# Patient Record
Sex: Female | Born: 1945 | Race: White | Hispanic: No | Smoking: Never smoker
Health system: Southern US, Community
[De-identification: ages and names within clinical notes are randomized; demographics above are authoritative.]

## PROBLEM LIST (undated history)

## (undated) DIAGNOSIS — K648 Other hemorrhoids: Secondary | ICD-10-CM

## (undated) DIAGNOSIS — E538 Deficiency of other specified B group vitamins: Secondary | ICD-10-CM

## (undated) DIAGNOSIS — IMO0002 Reserved for concepts with insufficient information to code with codable children: Secondary | ICD-10-CM

## (undated) DIAGNOSIS — K602 Anal fissure, unspecified: Secondary | ICD-10-CM

## (undated) DIAGNOSIS — K219 Gastro-esophageal reflux disease without esophagitis: Secondary | ICD-10-CM

## (undated) DIAGNOSIS — K5730 Diverticulosis of large intestine without perforation or abscess without bleeding: Secondary | ICD-10-CM

## (undated) DIAGNOSIS — Z86010 Personal history of colonic polyps: Secondary | ICD-10-CM

## (undated) DIAGNOSIS — N83209 Unspecified ovarian cyst, unspecified side: Secondary | ICD-10-CM

## (undated) DIAGNOSIS — F418 Other specified anxiety disorders: Secondary | ICD-10-CM

## (undated) DIAGNOSIS — D649 Anemia, unspecified: Secondary | ICD-10-CM

## (undated) DIAGNOSIS — K6389 Other specified diseases of intestine: Secondary | ICD-10-CM

## (undated) DIAGNOSIS — K589 Irritable bowel syndrome without diarrhea: Secondary | ICD-10-CM

## (undated) HISTORY — PX: APPENDECTOMY: SHX54

## (undated) HISTORY — DX: Diverticulosis of large intestine without perforation or abscess without bleeding: K57.30

## (undated) HISTORY — DX: Anemia, unspecified: D64.9

## (undated) HISTORY — DX: Anal fissure, unspecified: K60.2

## (undated) HISTORY — DX: Reserved for concepts with insufficient information to code with codable children: IMO0002

## (undated) HISTORY — PX: BLADDER SUSPENSION: SHX72

## (undated) HISTORY — DX: Deficiency of other specified B group vitamins: E53.8

## (undated) HISTORY — DX: Irritable bowel syndrome without diarrhea: K58.9

## (undated) HISTORY — DX: Other hemorrhoids: K64.8

## (undated) HISTORY — DX: Unspecified ovarian cyst, unspecified side: N83.209

## (undated) HISTORY — DX: Personal history of colonic polyps: Z86.010

---

## 1970-06-22 HISTORY — PX: ABDOMINAL HYSTERECTOMY: SHX81

## 1976-06-22 HISTORY — PX: ABDOMINOPLASTY: SUR9

## 1978-06-22 HISTORY — PX: BREAST ENHANCEMENT SURGERY: SHX7

## 2000-06-22 DIAGNOSIS — K6389 Other specified diseases of intestine: Secondary | ICD-10-CM

## 2000-06-22 DIAGNOSIS — K589 Irritable bowel syndrome without diarrhea: Secondary | ICD-10-CM

## 2000-06-22 DIAGNOSIS — K5730 Diverticulosis of large intestine without perforation or abscess without bleeding: Secondary | ICD-10-CM

## 2000-06-22 HISTORY — DX: Other specified diseases of intestine: K63.89

## 2000-06-22 HISTORY — DX: Irritable bowel syndrome, unspecified: K58.9

## 2000-06-22 HISTORY — DX: Diverticulosis of large intestine without perforation or abscess without bleeding: K57.30

## 2000-07-12 ENCOUNTER — Other Ambulatory Visit: Admission: RE | Admit: 2000-07-12 | Discharge: 2000-07-12 | Payer: Self-pay | Admitting: *Deleted

## 2000-08-06 ENCOUNTER — Encounter: Payer: Self-pay | Admitting: *Deleted

## 2000-08-06 ENCOUNTER — Encounter: Admission: RE | Admit: 2000-08-06 | Discharge: 2000-08-06 | Payer: Self-pay | Admitting: *Deleted

## 2000-08-09 ENCOUNTER — Encounter: Payer: Self-pay | Admitting: Gastroenterology

## 2000-08-09 DIAGNOSIS — K449 Diaphragmatic hernia without obstruction or gangrene: Secondary | ICD-10-CM

## 2000-08-09 DIAGNOSIS — K219 Gastro-esophageal reflux disease without esophagitis: Secondary | ICD-10-CM

## 2000-09-24 ENCOUNTER — Other Ambulatory Visit: Admission: RE | Admit: 2000-09-24 | Discharge: 2000-09-24 | Payer: Self-pay | Admitting: *Deleted

## 2000-09-24 ENCOUNTER — Encounter (INDEPENDENT_AMBULATORY_CARE_PROVIDER_SITE_OTHER): Payer: Self-pay | Admitting: Specialist

## 2001-06-22 HISTORY — PX: CHOLECYSTECTOMY: SHX55

## 2001-07-12 ENCOUNTER — Emergency Department (HOSPITAL_COMMUNITY): Admission: EM | Admit: 2001-07-12 | Discharge: 2001-07-12 | Payer: Self-pay | Admitting: Emergency Medicine

## 2001-07-22 ENCOUNTER — Observation Stay (HOSPITAL_COMMUNITY): Admission: RE | Admit: 2001-07-22 | Discharge: 2001-07-23 | Payer: Self-pay | Admitting: General Surgery

## 2001-07-22 ENCOUNTER — Encounter (INDEPENDENT_AMBULATORY_CARE_PROVIDER_SITE_OTHER): Payer: Self-pay

## 2001-09-15 ENCOUNTER — Other Ambulatory Visit: Admission: RE | Admit: 2001-09-15 | Discharge: 2001-09-15 | Payer: Self-pay | Admitting: *Deleted

## 2001-11-26 ENCOUNTER — Emergency Department (HOSPITAL_COMMUNITY): Admission: EM | Admit: 2001-11-26 | Discharge: 2001-11-26 | Payer: Self-pay | Admitting: Emergency Medicine

## 2003-09-27 ENCOUNTER — Other Ambulatory Visit: Admission: RE | Admit: 2003-09-27 | Discharge: 2003-09-27 | Payer: Self-pay | Admitting: *Deleted

## 2004-08-19 ENCOUNTER — Ambulatory Visit: Payer: Self-pay | Admitting: Gastroenterology

## 2004-11-03 ENCOUNTER — Other Ambulatory Visit: Admission: RE | Admit: 2004-11-03 | Discharge: 2004-11-03 | Payer: Self-pay | Admitting: *Deleted

## 2005-01-28 ENCOUNTER — Ambulatory Visit: Payer: Self-pay | Admitting: Internal Medicine

## 2005-08-21 ENCOUNTER — Ambulatory Visit: Payer: Self-pay | Admitting: Gastroenterology

## 2005-08-24 ENCOUNTER — Ambulatory Visit: Payer: Self-pay | Admitting: Internal Medicine

## 2005-08-31 ENCOUNTER — Ambulatory Visit: Payer: Self-pay | Admitting: Gastroenterology

## 2005-08-31 DIAGNOSIS — K5730 Diverticulosis of large intestine without perforation or abscess without bleeding: Secondary | ICD-10-CM | POA: Insufficient documentation

## 2006-04-05 ENCOUNTER — Other Ambulatory Visit: Admission: RE | Admit: 2006-04-05 | Discharge: 2006-04-05 | Payer: Self-pay | Admitting: *Deleted

## 2006-04-22 ENCOUNTER — Ambulatory Visit: Payer: Self-pay | Admitting: Gastroenterology

## 2006-04-22 LAB — CONVERTED CEMR LAB
ALT: 13 units/L (ref 0–40)
AST: 13 units/L (ref 0–37)
Albumin: 3.2 g/dL — ABNORMAL LOW (ref 3.5–5.2)
Alkaline Phosphatase: 62 units/L (ref 39–117)
BUN: 10 mg/dL (ref 6–23)
Basophils Absolute: 0.2 10*3/uL — ABNORMAL HIGH (ref 0.0–0.1)
Basophils Relative: 1.2 % — ABNORMAL HIGH (ref 0.0–1.0)
CO2: 27 meq/L (ref 19–32)
Calcium: 8.7 mg/dL (ref 8.4–10.5)
Chloride: 105 meq/L (ref 96–112)
Creatinine, Ser: 1 mg/dL (ref 0.4–1.2)
Eosinophil percent: 0.5 % (ref 0.0–5.0)
Ferritin: 12.4 ng/mL (ref 10.0–291.0)
Folate: 17.3 ng/mL
GFR calc non Af Amer: 60 mL/min
Glomerular Filtration Rate, Af Am: 72 mL/min/{1.73_m2}
Glucose, Bld: 93 mg/dL (ref 70–99)
HCT: 38.2 % (ref 36.0–46.0)
Hemoglobin: 13 g/dL (ref 12.0–15.0)
Iron: 97 ug/dL (ref 42–145)
Lymphocytes Relative: 22.2 % (ref 12.0–46.0)
MCHC: 34 g/dL (ref 30.0–36.0)
MCV: 87.7 fL (ref 78.0–100.0)
Monocytes Absolute: 0.5 10*3/uL (ref 0.2–0.7)
Monocytes Relative: 3.9 % (ref 3.0–11.0)
Neutro Abs: 8.9 10*3/uL — ABNORMAL HIGH (ref 1.4–7.7)
Neutrophils Relative %: 72.2 % (ref 43.0–77.0)
Platelets: 367 10*3/uL (ref 150–400)
Potassium: 4 meq/L (ref 3.5–5.1)
RBC: 4.36 M/uL (ref 3.87–5.11)
RDW: 12.6 % (ref 11.5–14.6)
Sodium: 138 meq/L (ref 135–145)
T4, Total: 7 ug/dL (ref 5.0–12.5)
TSH: 2.35 microintl units/mL (ref 0.35–5.50)
Total Bilirubin: 0.4 mg/dL (ref 0.3–1.2)
Total Protein: 6.9 g/dL (ref 6.0–8.3)
Vitamin B-12: 272 pg/mL (ref 211–911)
WBC: 12.5 10*3/uL — ABNORMAL HIGH (ref 4.5–10.5)

## 2006-04-26 ENCOUNTER — Ambulatory Visit: Payer: Self-pay | Admitting: Internal Medicine

## 2007-03-15 ENCOUNTER — Ambulatory Visit: Payer: Self-pay | Admitting: Gastroenterology

## 2007-04-11 ENCOUNTER — Emergency Department (HOSPITAL_COMMUNITY): Admission: EM | Admit: 2007-04-11 | Discharge: 2007-04-11 | Payer: Self-pay | Admitting: Emergency Medicine

## 2007-08-31 ENCOUNTER — Other Ambulatory Visit: Admission: RE | Admit: 2007-08-31 | Discharge: 2007-08-31 | Payer: Self-pay | Admitting: *Deleted

## 2007-09-05 DIAGNOSIS — F4323 Adjustment disorder with mixed anxiety and depressed mood: Secondary | ICD-10-CM

## 2007-09-05 DIAGNOSIS — N83209 Unspecified ovarian cyst, unspecified side: Secondary | ICD-10-CM

## 2007-09-05 DIAGNOSIS — IMO0002 Reserved for concepts with insufficient information to code with codable children: Secondary | ICD-10-CM | POA: Insufficient documentation

## 2007-09-05 DIAGNOSIS — K602 Anal fissure, unspecified: Secondary | ICD-10-CM

## 2007-09-05 DIAGNOSIS — R159 Full incontinence of feces: Secondary | ICD-10-CM | POA: Insufficient documentation

## 2007-09-05 DIAGNOSIS — K589 Irritable bowel syndrome without diarrhea: Secondary | ICD-10-CM

## 2008-01-23 ENCOUNTER — Ambulatory Visit: Payer: Self-pay | Admitting: Internal Medicine

## 2008-01-23 DIAGNOSIS — R21 Rash and other nonspecific skin eruption: Secondary | ICD-10-CM

## 2008-03-22 ENCOUNTER — Ambulatory Visit: Payer: Self-pay | Admitting: Gastroenterology

## 2008-03-22 DIAGNOSIS — K902 Blind loop syndrome, not elsewhere classified: Secondary | ICD-10-CM

## 2008-03-22 LAB — CONVERTED CEMR LAB
ALT: 18 units/L (ref 0–35)
AST: 15 units/L (ref 0–37)
Albumin: 3.3 g/dL — ABNORMAL LOW (ref 3.5–5.2)
Alkaline Phosphatase: 54 units/L (ref 39–117)
BUN: 13 mg/dL (ref 6–23)
Basophils Absolute: 0.1 10*3/uL (ref 0.0–0.1)
Basophils Relative: 1.3 % (ref 0.0–3.0)
Bilirubin, Direct: 0.1 mg/dL (ref 0.0–0.3)
CO2: 29 meq/L (ref 19–32)
Calcium: 8.5 mg/dL (ref 8.4–10.5)
Chloride: 105 meq/L (ref 96–112)
Creatinine, Ser: 0.7 mg/dL (ref 0.4–1.2)
Eosinophils Absolute: 0 10*3/uL (ref 0.0–0.7)
Eosinophils Relative: 0.8 % (ref 0.0–5.0)
GFR calc Af Amer: 108 mL/min
GFR calc non Af Amer: 90 mL/min
Glucose, Bld: 93 mg/dL (ref 70–99)
HCT: 36.9 % (ref 36.0–46.0)
Hemoglobin: 12.7 g/dL (ref 12.0–15.0)
Lymphocytes Relative: 33.9 % (ref 12.0–46.0)
MCHC: 34.5 g/dL (ref 30.0–36.0)
MCV: 91.5 fL (ref 78.0–100.0)
Monocytes Absolute: 0.4 10*3/uL (ref 0.1–1.0)
Monocytes Relative: 6.7 % (ref 3.0–12.0)
Neutro Abs: 3.3 10*3/uL (ref 1.4–7.7)
Neutrophils Relative %: 57.3 % (ref 43.0–77.0)
Platelets: 305 10*3/uL (ref 150–400)
Potassium: 4 meq/L (ref 3.5–5.1)
RBC: 4.03 M/uL (ref 3.87–5.11)
RDW: 12.5 % (ref 11.5–14.6)
Sed Rate: 24 mm/hr — ABNORMAL HIGH (ref 0–22)
Sodium: 141 meq/L (ref 135–145)
TSH: 1.62 microintl units/mL (ref 0.35–5.50)
Total Bilirubin: 0.6 mg/dL (ref 0.3–1.2)
Total Protein: 6.4 g/dL (ref 6.0–8.3)
WBC: 5.7 10*3/uL (ref 4.5–10.5)

## 2008-04-17 ENCOUNTER — Ambulatory Visit: Payer: Self-pay | Admitting: Internal Medicine

## 2008-04-17 DIAGNOSIS — R531 Weakness: Secondary | ICD-10-CM

## 2008-04-17 DIAGNOSIS — R197 Diarrhea, unspecified: Secondary | ICD-10-CM | POA: Insufficient documentation

## 2008-06-12 ENCOUNTER — Telehealth: Payer: Self-pay | Admitting: Gastroenterology

## 2008-06-12 ENCOUNTER — Telehealth: Payer: Self-pay | Admitting: Internal Medicine

## 2008-07-16 ENCOUNTER — Telehealth: Payer: Self-pay | Admitting: Gastroenterology

## 2008-09-06 ENCOUNTER — Ambulatory Visit: Payer: Self-pay | Admitting: Internal Medicine

## 2008-09-06 DIAGNOSIS — J309 Allergic rhinitis, unspecified: Secondary | ICD-10-CM | POA: Insufficient documentation

## 2008-11-21 ENCOUNTER — Telehealth: Payer: Self-pay | Admitting: Gastroenterology

## 2008-11-28 ENCOUNTER — Encounter: Payer: Self-pay | Admitting: Internal Medicine

## 2008-12-26 ENCOUNTER — Ambulatory Visit (HOSPITAL_BASED_OUTPATIENT_CLINIC_OR_DEPARTMENT_OTHER): Admission: RE | Admit: 2008-12-26 | Discharge: 2008-12-27 | Payer: Self-pay | Admitting: Orthopedic Surgery

## 2008-12-26 ENCOUNTER — Encounter: Payer: Self-pay | Admitting: Internal Medicine

## 2009-02-26 ENCOUNTER — Encounter: Admission: RE | Admit: 2009-02-26 | Discharge: 2009-05-27 | Payer: Self-pay | Admitting: Orthopedic Surgery

## 2009-02-26 ENCOUNTER — Ambulatory Visit: Payer: Self-pay | Admitting: Internal Medicine

## 2009-02-26 DIAGNOSIS — D485 Neoplasm of uncertain behavior of skin: Secondary | ICD-10-CM

## 2009-02-26 DIAGNOSIS — F411 Generalized anxiety disorder: Secondary | ICD-10-CM | POA: Insufficient documentation

## 2009-03-19 ENCOUNTER — Encounter: Payer: Self-pay | Admitting: Internal Medicine

## 2009-03-19 ENCOUNTER — Ambulatory Visit: Payer: Self-pay | Admitting: Internal Medicine

## 2009-03-19 LAB — CONVERTED CEMR LAB
ALT: 14 units/L (ref 0–35)
AST: 21 units/L (ref 0–37)
Albumin: 4 g/dL (ref 3.5–5.2)
Alkaline Phosphatase: 64 units/L (ref 39–117)
BUN: 15 mg/dL (ref 6–23)
Basophils Absolute: 0.1 10*3/uL (ref 0.0–0.1)
Basophils Relative: 0.7 % (ref 0.0–3.0)
Bilirubin Urine: NEGATIVE
Bilirubin, Direct: 0.1 mg/dL (ref 0.0–0.3)
CO2: 30 meq/L (ref 19–32)
Calcium: 9.2 mg/dL (ref 8.4–10.5)
Chloride: 112 meq/L (ref 96–112)
Cholesterol: 209 mg/dL — ABNORMAL HIGH (ref 0–200)
Creatinine, Ser: 0.8 mg/dL (ref 0.4–1.2)
Direct LDL: 122.8 mg/dL
Eosinophils Absolute: 0.1 10*3/uL (ref 0.0–0.7)
Eosinophils Relative: 1 % (ref 0.0–5.0)
GFR calc non Af Amer: 76.44 mL/min (ref >60)
Glucose, Bld: 93 mg/dL (ref 70–99)
HCT: 40 % (ref 36.0–46.0)
HDL: 61.8 mg/dL (ref >39.00)
Hemoglobin, Urine: NEGATIVE
Hemoglobin: 13.6 g/dL (ref 12.0–15.0)
Ketones, ur: NEGATIVE mg/dL
Leukocytes, UA: NEGATIVE
Lymphocytes Relative: 39.9 % (ref 12.0–46.0)
Lymphs Abs: 3.1 10*3/uL (ref 0.7–4.0)
MCHC: 33.9 g/dL (ref 30.0–36.0)
MCV: 89.3 fL (ref 78.0–100.0)
Monocytes Absolute: 0.5 10*3/uL (ref 0.1–1.0)
Monocytes Relative: 6.4 % (ref 3.0–12.0)
Neutro Abs: 3.9 10*3/uL (ref 1.4–7.7)
Neutrophils Relative %: 52 % (ref 43.0–77.0)
Nitrite: NEGATIVE
Platelets: 284 10*3/uL (ref 150.0–400.0)
Potassium: 4.3 meq/L (ref 3.5–5.1)
RBC: 4.48 M/uL (ref 3.87–5.11)
RDW: 13.6 % (ref 11.5–14.6)
Sodium: 144 meq/L (ref 135–145)
Specific Gravity, Urine: >=1.03 (ref 1.000–1.030)
TSH: 2.33 microintl units/mL (ref 0.35–5.50)
Total Bilirubin: 0.7 mg/dL (ref 0.3–1.2)
Total CHOL/HDL Ratio: 3
Total Protein, Urine: NEGATIVE mg/dL
Total Protein: 6.8 g/dL (ref 6.0–8.3)
Triglycerides: 148 mg/dL (ref 0.0–149.0)
Urine Glucose: NEGATIVE mg/dL
Urobilinogen, UA: 0.2 (ref 0.0–1.0)
VLDL: 29.6 mg/dL (ref 0.0–40.0)
WBC: 7.7 10*3/uL (ref 4.5–10.5)
pH: 5.5 (ref 5.0–8.0)

## 2009-04-03 ENCOUNTER — Ambulatory Visit: Payer: Self-pay | Admitting: Internal Medicine

## 2009-06-22 DIAGNOSIS — K219 Gastro-esophageal reflux disease without esophagitis: Secondary | ICD-10-CM

## 2009-06-22 HISTORY — DX: Gastro-esophageal reflux disease without esophagitis: K21.9

## 2009-07-25 ENCOUNTER — Ambulatory Visit: Payer: Self-pay | Admitting: Gastroenterology

## 2009-07-25 ENCOUNTER — Encounter (INDEPENDENT_AMBULATORY_CARE_PROVIDER_SITE_OTHER): Payer: Self-pay | Admitting: *Deleted

## 2009-07-31 ENCOUNTER — Ambulatory Visit: Payer: Self-pay | Admitting: Gastroenterology

## 2009-07-31 LAB — CONVERTED CEMR LAB
AST: 20 units/L (ref 0–37)
Alkaline Phosphatase: 75 units/L (ref 39–117)
Basophils Absolute: 0 10*3/uL (ref 0.0–0.1)
Calcium: 9.2 mg/dL (ref 8.4–10.5)
Eosinophils Absolute: 0.1 10*3/uL (ref 0.0–0.7)
Ferritin: 9.4 ng/mL — ABNORMAL LOW (ref 10.0–291.0)
Folate: 19.8 ng/mL
GFR calc non Af Amer: 89.08 mL/min (ref >60)
Iron: 167 ug/dL — ABNORMAL HIGH (ref 42–145)
MCHC: 33.4 g/dL (ref 30.0–36.0)
MCV: 90.9 fL (ref 78.0–100.0)
Magnesium: 1.9 mg/dL (ref 1.5–2.5)
Monocytes Absolute: 0.3 10*3/uL (ref 0.1–1.0)
Neutrophils Relative %: 56.9 % (ref 43.0–77.0)
Platelets: 263 10*3/uL (ref 150.0–400.0)
Potassium: 3.9 meq/L (ref 3.5–5.1)
RDW: 12.8 % (ref 11.5–14.6)
Saturation Ratios: 40.6 % (ref 20.0–50.0)
Sodium: 139 meq/L (ref 135–145)
Total Bilirubin: 0.6 mg/dL (ref 0.3–1.2)
WBC: 5.7 10*3/uL (ref 4.5–10.5)

## 2009-08-02 ENCOUNTER — Encounter: Payer: Self-pay | Admitting: Gastroenterology

## 2009-08-14 ENCOUNTER — Telehealth: Payer: Self-pay | Admitting: Gastroenterology

## 2009-08-21 ENCOUNTER — Ambulatory Visit: Payer: Self-pay | Admitting: Gastroenterology

## 2009-08-21 LAB — CONVERTED CEMR LAB
Fecal Occult Blood: NEGATIVE
OCCULT 2: NEGATIVE
OCCULT 4: NEGATIVE
OCCULT 5: NEGATIVE

## 2009-08-23 ENCOUNTER — Ambulatory Visit: Payer: Self-pay | Admitting: Gastroenterology

## 2009-08-27 ENCOUNTER — Telehealth: Payer: Self-pay | Admitting: Gastroenterology

## 2009-09-04 ENCOUNTER — Telehealth: Payer: Self-pay | Admitting: Gastroenterology

## 2009-09-13 ENCOUNTER — Encounter: Payer: Self-pay | Admitting: Gastroenterology

## 2009-10-30 ENCOUNTER — Telehealth: Payer: Self-pay | Admitting: Internal Medicine

## 2009-10-31 ENCOUNTER — Telehealth (INDEPENDENT_AMBULATORY_CARE_PROVIDER_SITE_OTHER): Payer: Self-pay | Admitting: *Deleted

## 2009-11-01 ENCOUNTER — Ambulatory Visit: Payer: Self-pay | Admitting: Internal Medicine

## 2009-11-07 ENCOUNTER — Telehealth: Payer: Self-pay | Admitting: Gastroenterology

## 2009-12-03 ENCOUNTER — Ambulatory Visit: Payer: Self-pay | Admitting: Internal Medicine

## 2009-12-03 DIAGNOSIS — M79609 Pain in unspecified limb: Secondary | ICD-10-CM | POA: Insufficient documentation

## 2010-02-04 ENCOUNTER — Telehealth: Payer: Self-pay | Admitting: Gastroenterology

## 2010-02-04 ENCOUNTER — Ambulatory Visit: Payer: Self-pay | Admitting: Gastroenterology

## 2010-02-04 ENCOUNTER — Encounter (INDEPENDENT_AMBULATORY_CARE_PROVIDER_SITE_OTHER): Payer: Self-pay | Admitting: *Deleted

## 2010-02-04 DIAGNOSIS — R1013 Epigastric pain: Secondary | ICD-10-CM

## 2010-02-05 ENCOUNTER — Ambulatory Visit (HOSPITAL_COMMUNITY): Admission: RE | Admit: 2010-02-05 | Discharge: 2010-02-05 | Payer: Self-pay | Admitting: Gastroenterology

## 2010-02-05 ENCOUNTER — Ambulatory Visit: Payer: Self-pay | Admitting: Gastroenterology

## 2010-02-05 DIAGNOSIS — E538 Deficiency of other specified B group vitamins: Secondary | ICD-10-CM | POA: Insufficient documentation

## 2010-02-05 LAB — CONVERTED CEMR LAB
ALT: 13 units/L (ref 0–35)
AST: 17 units/L (ref 0–37)
Alkaline Phosphatase: 66 units/L (ref 39–117)
Amylase: 38 units/L (ref 27–131)
BUN: 16 mg/dL (ref 6–23)
Basophils Absolute: 0 10*3/uL (ref 0.0–0.1)
Basophils Relative: 0.5 % (ref 0.0–3.0)
Bilirubin, Direct: 0.1 mg/dL (ref 0.0–0.3)
Chloride: 104 meq/L (ref 96–112)
Eosinophils Absolute: 0.1 10*3/uL (ref 0.0–0.7)
Folate: >20 ng/mL
GFR calc non Af Amer: 77.35 mL/min (ref >60)
Iron: 119 ug/dL (ref 42–145)
Lipase: 36 units/L (ref 11.0–59.0)
Lymphocytes Relative: 32.5 % (ref 12.0–46.0)
MCHC: 34.7 g/dL (ref 30.0–36.0)
MCV: 89.9 fL (ref 78.0–100.0)
Monocytes Absolute: 0.6 10*3/uL (ref 0.1–1.0)
Neutrophils Relative %: 59.4 % (ref 43.0–77.0)
Platelets: 299 10*3/uL (ref 150.0–400.0)
Potassium: 5.1 meq/L (ref 3.5–5.1)
RBC: 4.23 M/uL (ref 3.87–5.11)
Saturation Ratios: 28.9 % (ref 20.0–50.0)
Sodium: 140 meq/L (ref 135–145)
TSH: 1.57 microintl units/mL (ref 0.35–5.50)
Total Bilirubin: 0.5 mg/dL (ref 0.3–1.2)
Transferrin: 294 mg/dL (ref 212.0–360.0)

## 2010-02-07 ENCOUNTER — Encounter: Payer: Self-pay | Admitting: Gastroenterology

## 2010-02-11 ENCOUNTER — Ambulatory Visit: Payer: Self-pay | Admitting: Gastroenterology

## 2010-02-18 ENCOUNTER — Ambulatory Visit: Payer: Self-pay | Admitting: Gastroenterology

## 2010-02-25 ENCOUNTER — Ambulatory Visit: Payer: Self-pay | Admitting: Gastroenterology

## 2010-03-31 ENCOUNTER — Ambulatory Visit: Payer: Self-pay | Admitting: Gastroenterology

## 2010-04-03 ENCOUNTER — Ambulatory Visit: Payer: Self-pay | Admitting: Internal Medicine

## 2010-04-03 LAB — CONVERTED CEMR LAB
AST: 18 units/L (ref 0–37)
Albumin: 3.5 g/dL (ref 3.5–5.2)
Alkaline Phosphatase: 67 units/L (ref 39–117)
Basophils Relative: 0.7 % (ref 0.0–3.0)
Bilirubin Urine: NEGATIVE
CO2: 26 meq/L (ref 19–32)
Calcium: 9.1 mg/dL (ref 8.4–10.5)
Glucose, Bld: 90 mg/dL (ref 70–99)
HCT: 36.3 % (ref 36.0–46.0)
Hemoglobin, Urine: NEGATIVE
Hemoglobin: 12.7 g/dL (ref 12.0–15.0)
Ketones, ur: NEGATIVE mg/dL
Lymphocytes Relative: 41.1 % (ref 12.0–46.0)
Lymphs Abs: 2.6 10*3/uL (ref 0.7–4.0)
MCHC: 35 g/dL (ref 30.0–36.0)
Monocytes Relative: 5.1 % (ref 3.0–12.0)
Neutro Abs: 3.3 10*3/uL (ref 1.4–7.7)
Potassium: 4.5 meq/L (ref 3.5–5.1)
RBC: 4.04 M/uL (ref 3.87–5.11)
Sodium: 137 meq/L (ref 135–145)
TSH: 3.24 microintl units/mL (ref 0.35–5.50)
Total CHOL/HDL Ratio: 3
Total Protein: 6.5 g/dL (ref 6.0–8.3)
Triglycerides: 210 mg/dL — ABNORMAL HIGH (ref 0.0–149.0)
Urine Glucose: NEGATIVE mg/dL
Urobilinogen, UA: 0.2 (ref 0.0–1.0)

## 2010-05-01 ENCOUNTER — Ambulatory Visit: Payer: Self-pay | Admitting: Gastroenterology

## 2010-05-20 ENCOUNTER — Ambulatory Visit: Payer: Self-pay | Admitting: Internal Medicine

## 2010-05-20 LAB — CONVERTED CEMR LAB
ALT: 11 units/L (ref 0–35)
Basophils Relative: 0.6 % (ref 0.0–3.0)
Bilirubin Urine: NEGATIVE
Bilirubin, Direct: 0.1 mg/dL (ref 0.0–0.3)
Chloride: 104 meq/L (ref 96–112)
Creatinine, Ser: 0.7 mg/dL (ref 0.4–1.2)
Direct LDL: 99.1 mg/dL
Eosinophils Relative: 1 % (ref 0.0–5.0)
HCT: 36.3 % (ref 36.0–46.0)
Hemoglobin, Urine: NEGATIVE
Hemoglobin: 12.7 g/dL (ref 12.0–15.0)
MCV: 90 fL (ref 78.0–100.0)
Monocytes Absolute: 0.3 10*3/uL (ref 0.1–1.0)
Neutro Abs: 3.4 10*3/uL (ref 1.4–7.7)
Neutrophils Relative %: 55 % (ref 43.0–77.0)
Nitrite: NEGATIVE
Potassium: 4 meq/L (ref 3.5–5.1)
RBC: 4.03 M/uL (ref 3.87–5.11)
Sodium: 140 meq/L (ref 135–145)
Total CHOL/HDL Ratio: 3
Total Protein, Urine: NEGATIVE mg/dL
Total Protein: 6.4 g/dL (ref 6.0–8.3)
Urine Glucose: NEGATIVE mg/dL
VLDL: 40.6 mg/dL — ABNORMAL HIGH (ref 0.0–40.0)
WBC: 6.2 10*3/uL (ref 4.5–10.5)

## 2010-05-23 ENCOUNTER — Encounter: Payer: Self-pay | Admitting: Internal Medicine

## 2010-05-23 ENCOUNTER — Ambulatory Visit: Payer: Self-pay | Admitting: Internal Medicine

## 2010-05-26 ENCOUNTER — Telehealth: Payer: Self-pay | Admitting: Internal Medicine

## 2010-05-29 ENCOUNTER — Ambulatory Visit: Payer: Self-pay | Admitting: Gastroenterology

## 2010-06-05 ENCOUNTER — Telehealth: Payer: Self-pay | Admitting: Internal Medicine

## 2010-06-24 ENCOUNTER — Ambulatory Visit
Admission: RE | Admit: 2010-06-24 | Discharge: 2010-06-24 | Payer: Self-pay | Source: Home / Self Care | Attending: Internal Medicine | Admitting: Internal Medicine

## 2010-06-26 ENCOUNTER — Ambulatory Visit
Admission: RE | Admit: 2010-06-26 | Discharge: 2010-06-26 | Payer: Self-pay | Source: Home / Self Care | Attending: Gastroenterology | Admitting: Gastroenterology

## 2010-07-17 ENCOUNTER — Telehealth: Payer: Self-pay | Admitting: Gastroenterology

## 2010-07-17 ENCOUNTER — Telehealth: Payer: Self-pay | Admitting: Internal Medicine

## 2010-07-22 NOTE — Progress Notes (Signed)
Summary: speak to nurse  Phone Note Call from Patient Call back at Home Phone [phone]   Caller: Patient Call For: Jarold Motto Reason for Call: Talk to Nurse Summary of Call: Patient wants to speak  to nurse regarding colon results Initial call taken by: Tawni Levy,  August 14, 2009 1:24 PM  Follow-up for Phone Call        Pt is in the process of doing stool collections.  Needs to sch follow up OV.  Appt scheduled. Follow-up by: Ashok Cordia RN,  August 14, 2009 2:05 PM

## 2010-07-22 NOTE — Progress Notes (Signed)
Summary: ? about Lotronex  Phone Note Call from Patient Call back at Home Phone [phone]   Caller: Patient Summary of Call: patient has some questions about her Lotronex.  Initial call taken by: Harlow Mares CMA Duncan Dull),  September 04, 2009 11:14 AM  Follow-up for Phone Call        Lotronex has worked great for pt.  taking two times a day.  Now only having bm every other day.  Pt has decreased lotronex to once once daily for past few days.  She is asking if this is OK.  Pt instructed that it is OK to decrease use of lotronex.  She may decrease to every other day if feels she is getting constipated.  Pt is going to leave off med for a few days then resume at once a day dosage.  She will report back if any problems arise. Follow-up by: Ashok Cordia RN,  September 04, 2009 12:36 PM

## 2010-07-22 NOTE — Miscellaneous (Signed)
Summary: Lotronex form/[facility]  Lotronex form/[facility]   Imported By: Lester Palmyra 08/28/2009 09:53:45  _____________________________________________________________________  External Attachment:    Type:   Image     Comment:   External Document

## 2010-07-22 NOTE — Assessment & Plan Note (Signed)
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 A recent repeat her colonoscopy with multiple colon biopsies, and biopsies were normal. Labs and stool exams otherwise been unremarkable. Trial of probiotics therapy has not helped her problems. She denies any specific food intolerances. Evaluation for celiac disease been negative. She is not abusing sorbitol or fructose.   GI Review of Systems      Denies abdominal pain, acid reflux, belching, bloating, chest pain, dysphagia with liquids, dysphagia with solids, heartburn, loss of appetite, nausea, vomiting, vomiting blood, weight loss, and  weight gain.      Reports diarrhea and  fecal incontinence.     Denies anal fissure, black tarry stools, change in bowel habit, constipation, diverticulosis, heme positive stool, hemorrhoids, irritable bowel syndrome, jaundice, light color stool, liver problems, rectal bleeding, and  rectal pain.    Current Medications (verified): 1)  Pantoprazole Sodium 40 Mg Tbec (Pantoprazole Sodium) .... One By Mouth in Morning 2)  Zomig 5 Mg Tabs (Zolmitriptan) .... As Needed 3)  Xanax 0.25 Mg Tabs (Alprazolam) .Marland Kitchen.. 1 By Mouth Bid As Needed 4)  Estropipate 0.75 Mg Tabs (Estropipate) .... One Tablet By Mouth Once Daily 5)  Moviprep 100 Gm  Solr (Peg-Kcl-Nacl-Nasulf-Na Asc-C) .... As Per Prep Instructions. 6)  Align   Caps (Misc Intestinal Flora Regulat) .... Take One Capsule By Mouth Daily  Allergies (verified): 1)  Fluoxetine Hcl (Fluoxetine Hcl) 2)  Effexor Xr (Venlafaxine Hcl)  Past History:  Past medical, surgical, family and social histories (including risk factors)  reviewed for relevance to current acute and chronic problems.  Past Medical History: Reviewed history from 04/03/2009 and no changes required. HIATAL HERNIA (ICD-553.3) GERD (ICD-530.81) DIVERTICULOSIS, COLON (ICD-562.10) IRRITABLE BOWEL SYNDROME (ICD-564.1) RECTAL FISSURE (ICD-565.0) RECTAL INCONTINENCE (ICD-787.6) DEGENERATIVE DISC DISEASE (ICD-722.6) DEPRESSION (ICD-311) OVARIAN CYST (ICD-620.2) Anxiety GYN q 12 months Dr Chevis Pretty  Past Surgical History: Reviewed history from 02/26/2009 and no changes required. Cholecystectomy (2003) Hysterectomy (1972) C-Section (6045) Bladder Suspension (4098,1191, 1996) Breast Augmentation (1980) Abdominoplasty (1978) Rt. subacromial bursa injection (3/07) R ankle Fx graft  Family History: Reviewed history from 03/22/2008 and no changes required. Family History Hypertension F CAD Family History of Colon Cancer:great  grandfather lung cancer uncle Family History of Diabetes: father  Social History: Reviewed history from 03/22/2008 and no changes required. Married Never Smoked Alcohol Use - yes rare Illicit Drug Use - no Patient does not get regular exercise.   Review of Systems  The patient denies allergy/sinus, anemia, anxiety-new, arthritis/joint pain, back pain, blood in urine, breast changes/lumps, change in vision, confusion, cough, coughing up blood, depression-new, fainting, fatigue, fever, headaches-new, hearing problems, heart murmur, heart rhythm changes, itching, menstrual pain, muscle pains/cramps, night sweats, nosebleeds, pregnancy symptoms, shortness of breath, skin rash, sleeping problems, sore throat, swelling of feet/legs, swollen lymph glands, thirst - excessive , urination - excessive , urination changes/pain, urine leakage, vision changes, and voice change.    Vital Signs:  Patient profile:   65 year old female Height:      64 inches Weight:      149 pounds BMI:     25.67 Pulse rate:   78 / minute Pulse  rhythm:   regular BP sitting:  110 / 70  (right arm)  Vitals Entered By: Chales Abrahams CMA Duncan Dull) (August 23, 2009 11:35 AM)  Physical Exam  General:  Well developed, well nourished, no acute distress.healthy appearing.   Head:  Normocephalic and atraumatic. Eyes:  PERRLA, no icterus.exam deferred to patient's ophthalmologist.   Psych:  Alert and cooperative. Normal mood and affect.   Impression & Recommendations:  Problem # 1:  DIARRHEA (ICD-787.91) Assessment Unchanged Diagnosis consistent with severe diarrhea predominant irritable bowel syndrome. I've explained the risk and benefits of Lotronex therapy including ischemic colitis, have had her sign informed patient consent, and we'll start 0.5 mg twice a day as tolerated with usual precautions to decrease dose and call us if constipation ensues. Otherwise I will see her back in several weeks' time for followup.  Problem # 2:  ANXIETY (ICD-300.00) Assessment: Improved she That is on chronic Xanax therapy per primary care.  Patient Instructions: 1)  Copy sent to : Alex Plotnikov and primary care 2)  Lotronex patient contract reviewed and signed by patient and physician.  3)  Please continue current medications.  4)  Please schedule a follow-up appointment in 2 weeks.   Appended Document: Post proc/dfs    Clinical Lists Changes  Medications: Added new medication of LOTRONEX 0.5 MG  TABS (ALOSETRON HCL) 1 by mouth two times a day - Signed Rx of LOTRONEX 0.5 MG  TABS (ALOSETRON HCL) 1 by mouth two times a day;  #60 x 6;  Signed;  Entered by: Ashok Cordia RN;  Authorized by: Mardella Layman MD [facility];  Method used: [facility]  [address] [phone]*, [address] [place], [place], [place]  [id number], Ph: [phone] or [phone], Fax: [phone]    Prescriptions: LOTRONEX 0.5 MG  TABS (ALOSETRON HCL) 1 by mouth two times a day  #60 x 6   Entered by:   Ashok Cordia RN   Authorized by:   Mardella Layman MD [facility]   Signed by:   Ashok Cordia RN on 08/23/2009   Method used:   [facility]        [facility]  [address] [phone]* (retail)       [address]       [place], [place]  [id number]       Ph: [phone] or [phone]       Fax: [phone]   RxID:   [id number]

## 2010-07-22 NOTE — Miscellaneous (Signed)
Summary: carafate rx  Clinical Lists Changes  Medications: Added new medication of CARAFATE 1 GM/10ML  SUSP (SUCRALFATE) 10 ml p.c and h.s. - Signed Rx of CARAFATE 1 GM/10ML  SUSP (SUCRALFATE) 10 ml p.c and h.s.;  #1 pint x 3;  Signed;  Entered by: Weston Brass;  Authorized by: Mardella Layman MD [facility];  Method used: [facility]  [address] [phone]*, [address], [place], [place], [place]  [id number], Ph: [phone] or [phone], Fax: [phone] Allergies: Added new allergy or adverse reaction of * LATEX Observations: Added new observation of ALLERGY REV: Done (02/05/2010 15:26)    Prescriptions: CARAFATE 1 GM/10ML  SUSP (SUCRALFATE) 10 ml p.c and h.s.  #1 pint x 3   Entered by:   Weston Brass   Authorized by:   Mardella Layman MD [facility]   Signed by:   Weston Brass on 02/05/2010   Method used:   [facility]        [facility]  [address] [phone]* (retail)       [address]       [place], [place]  [id number]       Ph: [phone] or [phone]       Fax: [phone]   RxID:   [id number]
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
## 2010-07-22 NOTE — Assessment & Plan Note (Signed)
Summary: #3 of 3 weekly B12/266.2/dfs  Nurse Visit   Medication Administration  Injection # 1:    Medication: Vit B12 1000 mcg    Diagnosis: B12 DEFICIENCY (ICD-266.2)    Route: IM    Site: R deltoid    Exp Date: 11/2011    Lot #: [id number]    Mfr: [employer]    Comments: PT WILL RETURN ON 03/25/10 FOR NEXT INJECTION    Patient tolerated injection without complications    Given by: Francee Piccolo CMA Duncan Dull) (February 25, 2010 11:41 AM)  Orders Added: 1)  Vit B12 1000 mcg [J3420] Prescriptions: LOTRONEX 0.5 MG  TABS (ALOSETRON HCL) 1 by mouth two times a day  #180 x 3   Entered by:   Harlow Mares CMA (AAMA)   Authorized by:   Mardella Layman MD [facility]   Signed by:   Harlow Mares CMA (AAMA) on 02/25/2010   Method used:   Print then Give to Patient   RxID:   [id number]

## 2010-07-22 NOTE — Letter (Signed)
Summary: EGD Instructions  [facility]  [address] [place], [place] [address]   Phone: [phone]  Fax: [phone]       [name]    1946/01/22    MRN: [id number]       Procedure Day Dorna Bloom Wednesday, 02/05/10     Arrival Time: 1:30     Procedure Time: 2:30     Location of Procedure:                    _ X _ [facility] (4th Floor)    PREPARATION FOR ENDOSCOPY   ON  02/05/10  THE DAY OF THE PROCEDURE:  1.   Nothing to eat or drink after 5 am the morning of your procedure.  2.   You may drink clear liquids after your ultrasound at 11:00.  3.    Nothing to eat or drink after 12:30 pm.  (2 hrs prior to your proc).                                                                                                   CLEAR LIQUIDS INCLUDE: Water Jello Ice Popsicles Tea (sugar ok, no milk/cream) Powdered fruit flavored drinks Coffee (sugar ok, no milk/cream) Gatorade Juice: apple, white grape, white cranberry  Lemonade Clear bullion, consomm, broth Carbonated beverages (any kind) Strained chicken noodle soup Hard Candy   MEDICATION INSTRUCTIONS  Unless otherwise instructed, you should take regular prescription medications with a small sip of water as early as possible the morning of your procedure.                    OTHER INSTRUCTIONS  You will need a responsible adult at least 67 years of age to accompany you and drive you home.   This person must remain in the waiting room during your procedure.  Wear loose fitting clothing that is easily removed.  Leave jewelry and other valuables at home.  However, you may wish to bring a book to read or an iPod/MP3 player to listen to music as you wait for your procedure to start.  Remove all body piercing jewelry and leave at home.  Total time from sign-in until discharge is approximately 2-3 hours.  You should go home directly after your procedure and rest.  You can  resume normal activities the day after your procedure.  The day of your procedure you should not:   Drive   Make legal decisions   Operate machinery   Drink alcohol   Return to work  You will receive specific instructions about eating, activities and medications before you leave.    The above instructions have been reviewed and explained to me by   _______________________    I fully understand and can verbalize these instructions _____________________________ Date _________

## 2010-07-22 NOTE — Assessment & Plan Note (Signed)
Summary: diarrhea..em   History of Present Illness Visit Type: Follow-up Visit Primary GI MD: Sheryn Bison MD Faith Rogue Primary Provider: Trinna Post Plotnikov,MD Requesting Provider: n/a Chief Complaint: More frequent Diarrhea with Nausea and some lower abd cramping. Pt states it has been ongoing and getting worse over the last year. Pt states it can be explosive diarrhea. History of Present Illness:   Very complex and difficult patient who is a 65 year old White Female with Chronic, Chronic recurrent GI complaints consistent of abdominal gas, bloating, diarrhea, and IBS-type symptomatology. 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 However, multiple attempts to treat her with antibiotics have not been successful in alleviating her problems. She continues with gas, bloating, and rather urgent bowel movements without melena or hematochezia. Despite all these complaints she has no specific food intolerances except to lactose,and she denies abuse of sorbitol fructose. She's had no systemic complaints such as fever, chills, arthritis, mouth sores etc. She does have chronic depression and is on Xanax 0.25 mg twice a day. For chronic GERD she takes pantoprazole 40 mg a day. Last colonoscopy exam was several years ago and showed diverticulosis. She is status post cholecystectomy, hysterectomy, bladder suspension, and abdominoplasty.  She is recently seen" [employer]" on TV and is convinced that she has" pernicious anemia". On extensive chart review there is no evidence to support this impression. She has no symptoms consistent with malabsorption syndrome. She does use p.r.n. Imodium and becomes constipated.   GI Review of Systems    Reports abdominal pain and  nausea.     Location of  Abdominal pain: lower abdomen.    Denies acid reflux, belching, bloating, chest pain, dysphagia with liquids,  dysphagia with solids, heartburn, loss of appetite, vomiting, vomiting blood, weight loss, and  weight gain.      Reports diarrhea.     Denies anal fissure, black tarry stools, change in bowel habit, constipation, diverticulosis, fecal incontinence, heme positive stool, hemorrhoids, irritable bowel syndrome, jaundice, light color stool, liver problems, rectal bleeding, and  rectal pain.    Current Medications (verified): 1)  Pantoprazole Sodium 40 Mg Tbec (Pantoprazole Sodium) .... One By Mouth in Morning 2)  Zomig 5 Mg Tabs (Zolmitriptan) .... As Needed 3)  Xanax 0.25 Mg Tabs (Alprazolam) .Marland Kitchen.. 1 By Mouth Bid As Needed 4)  Estropipate 0.75 Mg Tabs (Estropipate) .... One Tablet By Mouth Once Daily  Allergies (verified): 1)  Fluoxetine Hcl (Fluoxetine Hcl) 2)  Effexor Xr (Venlafaxine Hcl)  Past History:  Past medical, surgical, family and social histories (including risk factors) reviewed for relevance to current acute and chronic problems.  Past Medical History: Reviewed history from 04/03/2009 and no changes required. HIATAL HERNIA (ICD-553.3) GERD (ICD-530.81) DIVERTICULOSIS, COLON (ICD-562.10) IRRITABLE BOWEL SYNDROME (ICD-564.1) RECTAL FISSURE (ICD-565.0) RECTAL INCONTINENCE (ICD-787.6) DEGENERATIVE DISC DISEASE (ICD-722.6) DEPRESSION (ICD-311) OVARIAN CYST (ICD-620.2) Anxiety GYN q 12 months Dr Chevis Pretty  Past Surgical History: Reviewed history from 02/26/2009 and no changes required. Cholecystectomy (2003) Hysterectomy (1972) C-Section (1610) Bladder Suspension (9604,5409, 1996) Breast Augmentation (1980) Abdominoplasty (1978) Rt. subacromial bursa injection (3/07) R ankle Fx graft  Family History: Reviewed history from 03/22/2008 and no changes required. Family History Hypertension F CAD Family History of Colon Cancer:great  grandfather lung cancer uncle Family History of Diabetes: father  Social History: Reviewed history from 03/22/2008 and no changes  required. Married Never Smoked Alcohol Use - yes rare Illicit Drug Use - no  Patient does not get regular exercise.   Review of Systems       The patient complains of change in vision.  The patient denies allergy/sinus, anemia, anxiety-new, arthritis/joint pain, back pain, blood in urine, breast changes/lumps, confusion, cough, coughing up blood, depression-new, fainting, fatigue, fever, headaches-new, hearing problems, heart murmur, heart rhythm changes, itching, menstrual pain, muscle pains/cramps, night sweats, nosebleeds, pregnancy symptoms, shortness of breath, skin rash, sleeping problems, sore throat, swelling of feet/legs, swollen lymph glands, thirst - excessive , urination - excessive , urination changes/pain, urine leakage, vision changes, and voice change.    Vital Signs:  Patient profile:   65 year old female Height:      64 inches Weight:      147.50 pounds BMI:     25.41 Pulse rate:   76 / minute Pulse rhythm:   regular BP sitting:   112 / 70  (right arm) Cuff size:   regular  Vitals Entered By: Christie Nottingham CMA Duncan Dull) (July 25, 2009 10:57 AM)  Physical Exam  General:  Well developed, well nourished, no acute distress.healthy appearing.   Head:  Normocephalic and atraumatic. Eyes:  PERRLA, no icterus.exam deferred to patient's ophthalmologist.   Lungs:  Clear throughout to auscultation. Heart:  Regular rate and rhythm; no murmurs, rubs,  or bruits. Abdomen:  Soft, nontender and nondistended. No masses, hepatosplenomegaly or hernias noted. Normal bowel sounds.Scarring of the lower abdomen from previous abdominoplasty but no acute lesions. Rectal:  Normal exam.hemocult negative.   Pulses:  Normal pulses noted. Extremities:  No clubbing, cyanosis, edema or deformities noted. Neurologic:  Alert and  oriented x4;  grossly normal neurologically. Inguinal Nodes:  No significant inguinal adenopathy. Psych:  Alert and cooperative. Normal mood and  affect.   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 Labs have been ordered as has followup colonoscopy with multiple biopsies. Should this be unremarkable we will consider Lotronex at conventional bases. However, because the chronicity of her problem and her previous failure to improve with all attempted treatment both here and at a [facility], it is probable that she will continue to have chronic GI difficulties. If so, I would recommend psychiatric evaluation before proceeding further. Orders: TLB-CBC Platelet - w/Differential (85025-CBCD) TLB-BMP (Basic Metabolic Panel-BMET) (80048-METABOL) TLB-Hepatic/Liver Function Pnl (80076-HEPATIC) TLB-TSH (Thyroid Stimulating Hormone) (84443-TSH) TLB-B12, Serum-Total ONLY ([address]-U04) TLB-Ferritin (82728-FER) TLB-Folic Acid (Folate) (82746-FOL) TLB-IBC Pnl (Iron/FE;Transferrin) (83550-IBC) TLB-Magnesium (Mg) (83735-MG) TLB-Sedimentation Rate (ESR) (85652-ESR) T-Beta Carotene [phone]) T-Giardia Lamblia IFA [phone]) T-Sprue Panel (Celiac Disease Aby Eval) (83516x3/[address]) TLB-IgA (Immunoglobulin A) (82784-IGA) Colonoscopy (Colon)  Problem # 2:  GERD (ICD-530.81) Assessment: Improved Consider discontinuation of PPI therapy.  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 Labs have been ordered include an anemia profile and malabsorption parameters and celiac antibodies.  Orders: Colonoscopy (Colon)  Patient Instructions: 1)  Copy sent to : Dr. Trinna Post Plotnokov 2)  Please continue current medications.  3)  Colonoscopy and Flexible Sigmoidoscopy brochure given.  4)  Conscious Sedation brochure given.  5)  Labs Ordered 6)  Trial of probiotic therapy 7)  The  medication list was reviewed and reconciled.  All changed / newly prescribed medications were explained.  A complete medication list was provided to the patient / caregiver. Prescriptions: MOVIPREP 100 GM  SOLR (PEG-KCL-NACL-NASULF-NA ASC-C) As per prep instructions.  #  1 x 0   Entered by:   Ashok Cordia RN   Authorized by:   Mardella Layman MD [facility]   Signed by:   Ashok Cordia RN on 07/25/2009   Method used:   Electronically to        [facility]  [address] [phone][facility] (retail)       [address]       [place], [place]  [id number]       Ph: [phone] or [phone]       Fax: [phone]   RxID:   [id number]

## 2010-07-22 NOTE — Assessment & Plan Note (Signed)
Summary: per flag/sd--fu---phone  stc   Vital Signs:  Patient profile:   65 year old female Height:      64 inches Weight:      151.25 pounds BMI:     26.06 O2 Sat:      96 % on Room air Temp:     97.2 degrees F oral Pulse rate:   75 / minute BP sitting:   130 / 84  (left arm) Cuff size:   regular  Vitals Entered By: Lucious Groves (Nov 01, 2009 10:42 AM)  O2 Flow:  Room air CC: Follow-up visit./kb Is Patient Diabetic? No Pain Assessment Patient in pain? no        Primary Care Provider:  Trinna Post Keyairra Kolinski,MD  CC:  Follow-up visit./kb.  History of Present Illness: The patient presents for a follow up of stress,  anxiety and headaches.   Current Medications (verified): 1)  Pantoprazole Sodium 40 Mg Tbec (Pantoprazole Sodium) .... One By Mouth in Morning 2)  Zomig 5 Mg Tabs (Zolmitriptan) .... As Needed 3)  Xanax 0.25 Mg Tabs (Alprazolam) .Marland Kitchen.. 1 By Mouth Bid As Needed 4)  Align   Caps (Misc Intestinal Flora Regulat) .... Take One Capsule By Mouth Daily 5)  Lotronex 0.5 Mg  Tabs (Alosetron Hcl) .Marland Kitchen.. 1 By Mouth Two Times A Day 6)  Premarin Dose Unknown .Marland Kitchen.. 1 By Mouth Qd  Allergies (verified): 1)  Fluoxetine Hcl (Fluoxetine Hcl) 2)  Effexor Xr (Venlafaxine Hcl)  Past History:  Past Medical History: Last updated: 04/03/2009 HIATAL HERNIA (ICD-553.3) GERD (ICD-530.81) DIVERTICULOSIS, COLON (ICD-562.10) IRRITABLE BOWEL SYNDROME (ICD-564.1) RECTAL FISSURE (ICD-565.0) RECTAL INCONTINENCE (ICD-787.6) DEGENERATIVE DISC DISEASE (ICD-722.6) DEPRESSION (ICD-311) OVARIAN CYST (ICD-620.2) Anxiety GYN q 12 months Dr Chevis Pretty  Social History: Last updated: 03/22/2008 Married Never Smoked Alcohol Use - yes rare Illicit Drug Use - no Patient does not get regular exercise.   Physical Exam  General:  alert, well-developed, well-nourished, well-hydrated, appropriate dress, normal appearance, healthy-appearing, and cooperative to examination.   Nose:  External nasal  examination shows no deformity or inflammation. Nasal mucosa are pink and moist without lesions or exudates. Mouth:  Oral mucosa and oropharynx without lesions or exudates.  Teeth in good repair. Lungs:  Normal respiratory effort, chest expands symmetrically. Lungs are clear to auscultation, no crackles or wheezes. Heart:  Normal rate and regular rhythm. S1 and S2 normal without gallop, murmur, click, rub or other extra sounds. Abdomen:  Bowel sounds positive,abdomen soft and non-tender without masses, organomegaly or hernias noted. Msk:  Using crutches to walk Ortho boot on R foot Neurologic:  No cranial nerve deficits noted. Station and gait are normal. Plantar reflexes are down-going bilaterally. DTRs are symmetrical throughout. Sensory, motor and coordinative functions appear intact. Skin:  k Psych:  Cognition and judgment appear intact. Alert and cooperative with normal attention span and concentration. No apparent delusions, illusions, hallucinations   Impression & Recommendations:  Problem # 1:  ANXIETY (ICD-300.00) Assessment Unchanged  Her updated medication list for this problem includes:    Xanax 0.25 Mg Tabs (Alprazolam) .Marland Kitchen... 1 by mouth bid as needed Assessment: Comment Only  Complete Medication List: 1)  Pantoprazole Sodium 40 Mg Tbec (Pantoprazole sodium) .... One by mouth in morning 2)  Zomig 5 Mg Tabs (Zolmitriptan) .... As needed 3)  Xanax 0.25 Mg Tabs (Alprazolam) .Marland Kitchen.. 1 by mouth bid as needed 4)  Align Caps (Misc intestinal flora regulat) .... Take one capsule by mouth daily 5)  Lotronex 0.5 Mg Tabs (Alosetron hcl) .Marland KitchenMarland KitchenMarland Kitchen  1 by mouth two times a day 6)  Premarin Dose Unknown  .Marland Kitchen.. 1 by mouth qd 7)  Zomig 5 Mg Soln (Zolmitriptan) .Marland Kitchen.. 1 spr once as needed migraine  Patient Instructions: 1)  Please schedule a follow-up appointment in 6 months well w/labs. Prescriptions: ZOMIG 5 MG SOLN (ZOLMITRIPTAN) 1 spr once as needed migraine  #36 x 2   Entered and Authorized by:    Tresa Garter MD   Signed by:   Tresa Garter MD on 11/01/2009   Method used:   Print then Give to Patient   RxID:   [id number] XANAX 0.25 MG TABS (ALPRAZOLAM) 1 by mouth bid as needed  #180 x 1   Entered and Authorized by:   Tresa Garter MD   Signed by:   Tresa Garter MD on 11/01/2009   Method used:   Print then Give to Patient   RxID:   [id number] ZOMIG 5 MG SOLN (ZOLMITRIPTAN) 1 spr once as needed migraine  #12 x 12   Entered and Authorized by:   Tresa Garter MD   Signed by:   Tresa Garter MD on 11/01/2009   Method used:   Print then Give to Patient   RxID:   [id number] Prudy Feeler 0.25 MG TABS (ALPRAZOLAM) 1 by mouth bid as needed  #60 x 3   Entered and Authorized by:   Tresa Garter MD   Signed by:   Tresa Garter MD on 11/01/2009   Method used:   Print then Give to Patient   RxID:   [id number]

## 2010-07-22 NOTE — Procedures (Signed)
Summary: Upper Endoscopy  Patient: [name] Note: All result statuses are Final unless otherwise noted.  Tests: (1) Upper Endoscopy (EGD)   EGD Upper Endoscopy       DONE     [facility]     [address]     [place], [place]  [address]           ENDOSCOPY PROCEDURE REPORT           PATIENT:  [name], [name]  MR#:  [id number]     BIRTHDATE:  1945/09/19, 64 yrs. old  GENDER:  female           ENDOSCOPIST:  [name], MD, [facility]     Referred by:  Linda Hedges. Plotnikov, M.D.           PROCEDURE DATE:  02/05/2010     PROCEDURE:  EGD with biopsy for H. pylori     ASA CLASS:  Class II     INDICATIONS:  abdominal pain despite treatment           MEDICATIONS:   Fentanyl 50 mcg IV, Versed 5 mg IV     TOPICAL ANESTHETIC:  Exactacain Spray           DESCRIPTION OF PROCEDURE:   After the risks benefits and     alternatives of the procedure were thoroughly explained, informed     consent was obtained.  The LB GIF-H180 [id number] endoscope was     introduced through the mouth and advanced to the second portion of     the duodenum, without limitations.  The instrument was slowly     withdrawn as the mucosa was fully examined.     <<PROCEDUREIMAGES>>           A hiatal hernia was found. 5CM. HH NOTED  Nodular mucosa was found     in the fundus. BENIGN FUNDAL HYPERPLASTIC POLYPS NOTED.  Normal     duodenal folds were noted. SI BX. DONE.  The esophagus and     gastroesophageal junction were completely normal in appearance.     DILATED HYPOTONIC ESOPHAGUS BIOPSIED.    Retroflexed views     revealed a hiatal hernia.    The scope was then withdrawn from the     patient and the procedure completed.           COMPLICATIONS:  None           ENDOSCOPIC IMPRESSION:     1) Hiatal hernia     2) Nodular mucosa in the fundus     3) Normal duodenal folds     4) Normal esophagus     5) A hiatal hernia     CHRONIC GERD.R/O EOSINOPHILIC ESOPHAGITIS,CELIAC DISEASE,VS     WORSENING  GERD.SHE MAY HAVE AN UNDERLYING GI MOTILITY DISOREDR.     RECOMMENDATIONS:     1) Anti-reflux regimen to be follow     2) Await biopsy results     3) continue PPI     LIQUID CARAFATE 10CC PC AND QHS.I PT. REFILL X3.           REPEAT EXAM:  No           ______________________________     [name], MD, [facility]           CC:           n.     eSIGNED:   Vania Rea. Patterson at 02/05/2010 03:20 PM  [place], [place], [id number]  Note: An exclamation mark (!) indicates a result that was not dispersed into the flowsheet. Document Creation Date: 02/05/2010 3:22 PM _______________________________________________________________________  (1) Order result status: Final Collection or observation date-time: 02/05/2010 15:11 Requested date-time:  Receipt date-time:  Reported date-time:  Referring Physician:   Ordering Physician: Sheryn Bison [phone]) Specimen Source:  Source: Launa Grill Order Number: [id number] Lab site:

## 2010-07-22 NOTE — Letter (Signed)
Summary: Patient [name] Biopsy Results  [facility]  [address] [place], [place] [address]   Phone: [phone]  Fax: [phone]        February 07, 2010 MRN: [id number]    [name] [address] [place], [place]  [address]    Dear Ms. Dubeau,  I am pleased to inform you that the biopsies taken during your recent endoscopic examination did not show any evidence of cancer upon pathologic examination.  Additional information/recommendations:  __No further action is needed at this time.  Please follow-up with      your primary care physician for your other healthcare needs.  __ Please call [phone] to schedule a return visit to review      your condition.  _X_ Continue with the treatment plan as outlined on the day of your      exam.aLL BIOPSIES ARE NORMAL.Marland KitchenMarland KitchenNO BARRETT'S MUCOSA /CELIAC DISEASE.  __ You should have a repeat endoscopic examination for this problem              in _ months/years.   Please call us if you are having persistent problems or have questions about your condition that have not been fully answered at this time.  Sincerely,  Mardella Layman MD [facility]  This letter has been electronically signed by your physician.  Appended Document: Patient Notice-Endo Biopsy Results letter mailed

## 2010-07-22 NOTE — Miscellaneous (Signed)
Summary: Orders Update/clotest  Clinical Lists Changes  Orders: Added new Test order of TLB-H Pylori Screen Gastric Biopsy (83013-CLOTEST) - Signed 

## 2010-07-22 NOTE — Assessment & Plan Note (Signed)
Summary: MONTHLY B12 SHOT/JMS  Nurse Visit   Allergies: 1)  ! Darvocet 2)  ! * Latex 3)  Fluoxetine Hcl (Fluoxetine Hcl) 4)  Effexor Xr (Venlafaxine Hcl) 5)  Wellbutrin  Medication Administration  Injection # 1:    Medication: Vit B12 1000 mcg    Diagnosis: B12 DEFICIENCY (ICD-266.2)    Route: IM    Site: R deltoid    Exp Date: 03/2012    Lot #: [id number]    Mfr: [employer]    Comments: pt will return on 06/26/10 for next injection    Patient tolerated injection without complications    Given by: Francee Piccolo CMA Duncan Dull) (May 29, 2010 11:37 AM)  Orders Added: 1)  Vit B12 1000 mcg [J3420]

## 2010-07-22 NOTE — Progress Notes (Signed)
Summary: Questions about medications  Phone Note Call from Patient Call back at Home Phone [phone]   Caller: Patient Call For: Dr. Jarold Motto Reason for Call: Talk to Nurse Summary of Call: Pt would like to know if she should continue to take all of meds with the Lotronex Initial call taken by: Karna Christmas,  August 27, 2009 10:44 AM  Follow-up for Phone Call        LM for pt to call.  Lupita Leash Surface RN  August 27, 2009 12:32 PM  Pt akss if OK to cont align and benefiber.  Pt instructed OK to take.  Follow-up by: Ashok Cordia RN,  August 27, 2009 12:40 PM

## 2010-07-22 NOTE — Assessment & Plan Note (Signed)
Summary: abd pain...as.   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 She does use p.r.n. NSAIDs, and is on daily pantoprazole. She denies any specific hepatobiliary complaints or lower gastrointestinal problems. Her last endoscopic exam was in 2004. She denies a known history of hepatitis or pancreatitis. She also denies abuse of alcohol or cigarettes.   GI Review of Systems    Reports abdominal pain and  bloating.     Location of  Abdominal pain: epigastric area.    Denies acid reflux, belching, chest pain, dysphagia with liquids, dysphagia with solids, heartburn, loss of appetite, nausea, vomiting, vomiting blood, weight loss, and  weight gain.        Denies anal fissure, black tarry stools, change in bowel habit, constipation, diarrhea, diverticulosis, fecal incontinence, heme positive stool, hemorrhoids, irritable bowel syndrome, jaundice, light color stool, liver problems, rectal bleeding, and  rectal pain.    Current Medications (verified): 1)  Pantoprazole Sodium 40 Mg Tbec (Pantoprazole Sodium) .... One By Mouth in Morning 2)  Zomig 5 Mg Tabs (Zolmitriptan) .... As Needed 3)  Xanax 0.25 Mg Tabs (Alprazolam) .Marland Kitchen.. 1 By Mouth Bid As Needed 4)  Align   Caps (Misc Intestinal Flora Regulat) .... Take One Capsule By Mouth Daily 5)  Lotronex 0.5 Mg  Tabs (Alosetron Hcl) .Marland Kitchen.. 1 By Mouth Two Times A Day 6)  Premarin Dose Unknown .Marland Kitchen.. 1 By Mouth Qd 7)  Zomig 5 Mg Soln (Zolmitriptan) .Marland Kitchen.. 1 Spr Once As Needed Migraine 8)  Pennsaid 1.5 % Soln (Diclofenac Sodium)  .... 3-5 Gtt On Skin Three Times A Day For Pain  Allergies (verified): 1)  ! Darvocet 2)  Fluoxetine Hcl (Fluoxetine Hcl) 3)  Effexor Xr (Venlafaxine Hcl)  Past History:  Past medical, surgical, family and social histories (including risk factors) reviewed for relevance to current acute and chronic problems.  Past Medical History: Reviewed history from 04/03/2009 and no changes required. HIATAL HERNIA (ICD-553.3) GERD (ICD-530.81) DIVERTICULOSIS, COLON (ICD-562.10) IRRITABLE BOWEL SYNDROME (ICD-564.1) RECTAL FISSURE (ICD-565.0) RECTAL INCONTINENCE (ICD-787.6) DEGENERATIVE DISC DISEASE (ICD-722.6) DEPRESSION (ICD-311) OVARIAN CYST (ICD-620.2) Anxiety GYN q 12 months Dr Chevis Pretty  Past Surgical History: Reviewed history from 02/26/2009 and no changes required. Cholecystectomy (2003) Hysterectomy (1972) C-Section (1610) Bladder Suspension (9604,5409, 1996) Breast Augmentation (1980) Abdominoplasty (1978) Rt. subacromial bursa injection (3/07) R ankle Fx graft  Family History: Reviewed history from 03/22/2008 and no changes required. Family History Hypertension F CAD Family History of Colon Cancer:great  grandfather lung cancer uncle Family History of Diabetes: father  Social History: Reviewed history from 03/22/2008 and no changes required. Married Never Smoked Alcohol Use - yes rare Illicit Drug Use - no Patient does not get regular exercise.   Review of Systems       The patient complains of back pain.  The patient denies allergy/sinus, anemia, anxiety-new, arthritis/joint pain, blood in urine, breast changes/lumps, change in vision, confusion, cough, coughing up blood, depression-new, fainting, fatigue, fever, headaches-new, hearing problems, heart murmur, heart rhythm changes, itching, menstrual pain, muscle pains/cramps, night sweats, nosebleeds, pregnancy symptoms, shortness of breath, skin rash, sleeping problems, sore throat, swelling of  feet/legs, swollen lymph  glands, thirst - excessive , urination - excessive , urination changes/pain, urine leakage, vision changes, and voice change.    Vital Signs:  Patient profile:   65 year old female Height:      64 inches Weight:      149 pounds BMI:     25.67 Pulse rate:   68 / minute Pulse rhythm:   regular BP sitting:   120 / 76  (left arm) Cuff size:   regular  Vitals Entered By: Ok Anis CMA (February 04, 2010 10:45 AM)  Physical Exam  General:  Well developed, well nourished, no acute distress.healthy appearing.   Head:  Normocephalic and atraumatic. Eyes:  PERRLA, no icterus.exam deferred to patient's ophthalmologist.   Lungs:  Clear throughout to auscultation. Heart:  Regular rate and rhythm; no murmurs, rubs,  or bruits. Abdomen:  Her Abdomen Is not distended but there is fullness in the epigastric area which is probably the left lobe of the liver. I cannot appreciate other abdominal masses or areas of tenderness or ascites. Bowel sounds are normal. Extremities:  No clubbing, cyanosis, edema or deformities noted. Neurologic:  Alert and  oriented x4;  grossly normal neurologically. Skin:  Intact without significant lesions or rashes. Psych:  Alert and cooperative. Normal mood and affect.   Impression & Recommendations:  Problem # 1:  EPIGASTRIC DISCOMFORT (ICD-789.06) Assessment Unchanged Probable NSAID induced damage to the upper GI tract with associated ulceration. I am concerned however about the fullness in her epigastric area suggesting possible hepatic mass. I've scheduled ultrasound and endoscopic exam and will repeat screening laboratory parameters.  Problem # 2:  DIARRHEA (ICD-787.91) Assessment: Improved Her Diarrhea Predominant IBS is well controlled on Lotronex 0.5 mg twice a day. She is having problems financially secure in this medication.  Other Orders: TLB-CBC Platelet - w/Differential (85025-CBCD) TLB-BMP (Basic Metabolic Panel-BMET) (80048-METABOL) TLB-Hepatic/Liver  Function Pnl (80076-HEPATIC) TLB-TSH (Thyroid Stimulating Hormone) (84443-TSH) TLB-B12, Serum-Total ONLY (54098-J19) TLB-Ferritin (82728-FER) TLB-Folic Acid (Folate) (82746-FOL) TLB-IBC Pnl (Iron/FE;Transferrin) (83550-IBC) TLB-Amylase (82150-AMYL) TLB-Lipase (83690-LIPASE)  Patient Instructions: 1)  Please go to the basement for lab work. 2)  You are scheduled for an ultrasound and upper endoscopy. 3)  Use Gi cocktail as needed. 4)  The medication list was reviewed and reconciled.  All changed / newly prescribed medications were explained.  A complete medication list was provided to the patient / caregiver. 5)  Copy sent to : Dr. Trinna Post PLotnikov 6)  Please continue current medications.   Appended Document: Orders Update    Clinical Lists Changes  Orders: Added new Test order of EGD (EGD) - Signed      Appended Document: abd pain...as.    Clinical Lists Changes  Medications: Added new medication of * GI COCKTAIL  (EQUAL PARTS MAALOX, DONNATAL, XYLOCAINE) 1 Tbsp 1 6-8 hrs as needed - Signed Rx of GI COCKTAIL  (EQUAL PARTS MAALOX, DONNATAL, XYLOCAINE) 1 Tbsp 1 6-8 hrs as needed;  #1 pt x 0;  Signed;  Entered by: Ashok Cordia RN;  Authorized by: Mardella Layman MD [facility];  Method used: [facility]axed to [facility]  [address] [phone]*, [address], [place], [place], [place]  [id number], Ph: [phone] or [phone], Fax: [phone] Orders: Added new Test order of Ultrasound Abdomen (UAS) - Signed    Prescriptions: GI COCKTAIL  (EQUAL PARTS MAALOX, DONNATAL, XYLOCAINE) 1 Tbsp 1 6-8 hrs as needed  #1 pt x 0   Entered by:   Ashok Cordia RN  Authorized by:   Mardella Layman MD [facility]   Signed by:   Ashok Cordia RN on 02/04/2010   Method used:   [facility]ed to ...       [facility]  [address] [phone]* (retail)       [address]       [place], [place]  [id number]       Ph: [phone] or [phone]       Fax: [phone]   RxID:    [id number]

## 2010-07-22 NOTE — Procedures (Signed)
Summary: Colonoscopy  Patient: [name] Note: All result statuses are Final unless otherwise noted.  Tests: (1) Colonoscopy (COL)   COL Colonoscopy           DONE     [facility]     [address]     [place], [place]  [address]           COLONOSCOPY PROCEDURE REPORT           PATIENT:  [name], [name]  MR#:  [id number]     BIRTHDATE:  Jun 24, 1945, 63 yrs. old  GENDER:  female           ENDOSCOPIST:  [name], MD, [facility]     Referred by:           PROCEDURE DATE:  07/31/2009     PROCEDURE:  Colonoscopy with biopsy     ASA CLASS:  Class II     INDICATIONS:  unexplained diarrhea CHRONIC DIARRHEA.R/O     MICROSCOPIC/COLLAGENOUS COLITIS.           MEDICATIONS:   Fentanyl 100 mcg IV, Versed 10 mg IV           DESCRIPTION OF PROCEDURE:   After the risks benefits and     alternatives of the procedure were thoroughly explained, informed     consent was obtained.  Digital rectal exam was performed and     revealed no abnormalities.   The LB [id number] endoscope     was introduced through the anus and advanced to the cecum, which     was identified by both the appendix and ileocecal valve, without     limitations.  The quality of the prep was adequate, using     MoviPrep.  The instrument was then slowly withdrawn as the colon     was fully examined.     <<PROCEDUREIMAGES>>           FINDINGS:  Severe diverticulosis was found sigmoid to descending     No polyps or cancers were seen.  This was otherwise a normal     examination of the colon. RANDOM BIOPSIES DONE EVERY 10 CM.     Retroflexed views in the rectum revealed hypertrophied anal     papillae.    The scope was then withdrawn from the patient and the     procedure completed.           COMPLICATIONS:  None           ENDOSCOPIC IMPRESSION:     1) Severe diverticulosis in the sigmoid to descending     2) No polyps or cancers     3) Otherwise normal examination     4) Hypertrophied anal  papillae     PROBABLE SEVERE IBS.R/O MICROSCOPIC/COLLAGENOUS COLITIS,,,,     RECOMMENDATIONS:     1) Await biopsy results     STOOL EXAMS FOR GIARDIA AND FECAL ELASTASE-1 ORDERED.???? TRIAL     OF lOTRONEX.???           REPEAT EXAM:  No           ______________________________     [name], MD, [facility]           CC:  Linda Hedges. Plotnikov, MD           n.     Rosalie DoctorVania Rea. Kei Mcelhiney at 07/31/2009 09:01 AM           Gaye Alken,  [place], [place] [id number]  Note: An exclamation mark (!) indicates a result that was not dispersed into the flowsheet. Document Creation Date: 07/31/2009 10:18 AM _______________________________________________________________________  (1) Order result status: Final Collection or observation date-time: 07/31/2009 08:53 Requested date-time:  Receipt date-time:  Reported date-time:  Referring Physician:   Ordering Physician: Sheryn Bison [phone]) Specimen Source:  Source: Launa Grill Order Number: [id number] Lab site:

## 2010-07-22 NOTE — Letter (Signed)
Summary: Patient Notice- Colon Biospy Results  [facility]  [address] [place], [place] [address]   Phone: [phone]  Fax: [phone]        August 02, 2009 MRN: [id number]    [name] [address] [place], [place]  [address]    Dear [name],  I am pleased to inform you that the biopsies taken during your recent colonoscopy did not show any evidence of cancer upon pathologic examination.  Additional information/recommendations:  __No further action is needed at this time.  Please follow-up with      your primary care physician for your other healthcare needs.  _XX_Please call [phone] to schedule a return visit to review      your condition.  __Continue with the treatment plan as outlined on the day of your      exam.  __You should have a repeat colonoscopy examination for this problem           in _ years.  Please call us if you are having persistent problems or have questions about your condition that have not been fully answered at this time.  Sincerely,  Mardella Layman MD [facility]   This letter has been electronically signed by your physician.  Appended Document: Patient Notice- Colon Biospy Results Letter mailed 2.15.11

## 2010-07-22 NOTE — Letter (Signed)
Summary: [facility] Instructions  [facility]  [address] [place], [place] [address]   Phone: [phone]  Fax: [phone]       [name]    08-02-1945    MRN: [id number]        Procedure Day Dorna Bloom: Wednesday, 07/31/09     Arrival Time: 7:30      Procedure Time: 8:30     Location of Procedure:                    _ _  [facility] (4th Floor)                        PREPARATION FOR COLONOSCOPY WITH MOVIPREP   Starting 5 days prior to your procedure 07/26/09 do not eat nuts, seeds, popcorn, corn, beans, peas,  salads, or any raw vegetables.  Do not take any fiber supplements (e.g. Metamucil, Citrucel, and Benefiber).  THE DAY BEFORE YOUR PROCEDURE         DATE: 07/30/09  GMW:NUUVOZD  1.  Drink clear liquids the entire day-NO SOLID FOOD  2.  Do not drink anything colored red or purple.  Avoid juices with pulp.  No orange juice.  3.  Drink at least 64 oz. (8 glasses) of fluid/clear liquids during the day to prevent dehydration and help the prep work efficiently.  CLEAR LIQUIDS INCLUDE: Water Jello Ice Popsicles Tea (sugar ok, no milk/cream) Powdered fruit flavored drinks Coffee (sugar ok, no milk/cream) Gatorade Juice: apple, white grape, white cranberry  Lemonade Clear bullion, consomm, broth Carbonated beverages (any kind) Strained chicken noodle soup Hard Candy                             4.  In the morning, mix first dose of MoviPrep solution:    Empty 1 Pouch A and 1 Pouch B into the disposable container    Add lukewarm drinking water to the top line of the container. Mix to dissolve    Refrigerate (mixed solution should be used within 24 hrs)  5.  Begin drinking the prep at 5:00 p.m. The MoviPrep container is divided by 4 marks.   Every 15 minutes drink the solution down to the next mark (approximately 8 oz) until the full liter is complete.   6.  Follow completed prep with 16 oz of clear liquid of your choice (Nothing red  or purple).  Continue to drink clear liquids until bedtime.  7.  Before going to bed, mix second dose of MoviPrep solution:    Empty 1 Pouch A and 1 Pouch B into the disposable container    Add lukewarm drinking water to the top line of the container. Mix to dissolve    Refrigerate  THE DAY OF YOUR PROCEDURE      DATE: 07/31/09  DAY: Wednesday  Beginning at 3:30 a.m. (5 hours before procedure):         1. Every 15 minutes, drink the solution down to the next mark (approx 8 oz) until the full liter is complete.  2. Follow completed prep with 16 oz. of clear liquid of your choice.    3. You may drink clear liquids until 6:30  (2 HOURS BEFORE PROCEDURE).   MEDICATION INSTRUCTIONS  Unless otherwise instructed, you should take regular prescription medications with a small sip of water   as early as possible the morning of your  procedure.                 OTHER INSTRUCTIONS  You will need a responsible adult at least 67 years of age to accompany you and drive you home.   This person must remain in the waiting room during your procedure.  Wear loose fitting clothing that is easily removed.  Leave jewelry and other valuables at home.  However, you may wish to bring a book to read or  an iPod/MP3 player to listen to music as you wait for your procedure to start.  Remove all body piercing jewelry and leave at home.  Total time from sign-in until discharge is approximately 2-3 hours.  You should go home directly after your procedure and rest.  You can resume normal activities the  day after your procedure.  The day of your procedure you should not:   Drive   Make legal decisions   Operate machinery   Drink alcohol   Return to work  You will receive specific instructions about eating, activities and medications before you leave.    The above instructions have been reviewed and explained to me by   _______________________    I fully understand and can  verbalize these instructions _____________________________ Date _________

## 2010-07-22 NOTE — Progress Notes (Signed)
Summary: Lotronex  Phone Note Call from Patient Call back at Home Phone [phone]   Caller: Patient Call For: Dr. Jarold Motto Reason for Call: Refill Medication Summary of Call: would like a refill on Lotronex... 90 day rx to [facility]... only has three pills left and would like to pick up samples Initial call taken by: Vallarie Mare,  Nov 07, 2009 11:59 AM  Follow-up for Phone Call        Pt would like rx for 90 day supply of lotronex sent to [facility],  She also needs 30 day supply to take to local pharmacy.  Pt given info on Lotronex savings program.  Rx sent to [facility], written rx left at front for pt. Follow-up by: Ashok Cordia RN,  Nov 07, 2009 12:38 PM    Prescriptions: LOTRONEX 0.5 MG  TABS (ALOSETRON HCL) 1 by mouth two times a day  #180 x 1   Entered by:   Ashok Cordia RN   Authorized by:   Mardella Layman MD [facility]   Signed by:   Ashok Cordia RN on 11/07/2009   Method used:   Electronically to        [facility]* (mail-order)             ,          Ph: [phone]       Fax: [phone]   RxID:   [id number] LOTRONEX 0.5 MG  TABS (ALOSETRON HCL) 1 by mouth two times a day  #60 x 0   Entered by:   Ashok Cordia RN   Authorized by:   Mardella Layman MD [facility]   Signed by:   Ashok Cordia RN on 11/07/2009   Method used:   Print then Give to Patient   RxID:   [id number]

## 2010-07-22 NOTE — Assessment & Plan Note (Signed)
Summary: CPX-LB   Vital Signs:  Patient profile:   65 year old female Height:      64 inches Weight:      150 pounds BMI:     25.84 Temp:     98.3 degrees F oral Pulse rate:   76 / minute Pulse rhythm:   regular Resp:     16 per minute BP sitting:   100 / 68  (left arm) Cuff size:   regular  Vitals Entered By: Lanier Prude, Beverly Gust) (May 23, 2010 2:52 PM) CC: CPX Is Patient Diabetic? No Comments pt is not taking Lotronex...it was too expensive   Primary Care Shyne Resch:  Sula Soda, MD   CC:  CPX.  History of Present Illness: The patient presents for a preventive health examination  Patient past medical history, social history, and family history reviewed in detail no significant changes.  Patient is physically active.  Hearing is normal, and able to perform activities of daily living. Risk of falling is negligible and home safety has been reviewed and is appropriate. Patient has normal height, weight, and visual acuity. Patient has been counseled on age-appropriate routine health concerns for screening and prevention. Education, counseling done.  C/o depression, stress  Preventive Screening-Counseling & Management  Alcohol-Tobacco     Alcohol drinks/day: 0     Smoking Status: never  Caffeine-Diet-Exercise     Caffeine Counseling: not indicated; caffeine use is not excessive or problematic     Does Patient Exercise: no     Depression Counseling: further diagnostic testing and/or other treatment is indicated  Hep-HIV-STD-Contraception     Hepatitis Risk: no risk noted     Sun Exposure-Excessive: no  Safety-Violence-Falls     Seat Belt Use: yes     Fall Risk Counseling: not indicated; no significant falls noted      Sexual History:  currently monogamous.    Current Medications (verified): 1)  Pantoprazole Sodium 40 Mg Tbec (Pantoprazole Sodium) .... One By Mouth in Morning 2)  Zomig 5 Mg Tabs (Zolmitriptan) .... As Needed 3)  Xanax 0.25 Mg Tabs  (Alprazolam) .Marland Kitchen.. 1 By Mouth Bid As Needed 4)  Align   Caps (Misc Intestinal Flora Regulat) .... Take One Capsule By Mouth Daily 5)  Lotronex 0.5 Mg  Tabs (Alosetron Hcl) .Marland Kitchen.. 1 By Mouth Two Times A Day 6)  Premarin Dose Unknown .Marland Kitchen.. 1 By Mouth Qd 7)  Zomig 5 Mg Soln (Zolmitriptan) .Marland Kitchen.. 1 Spr Once As Needed Migraine 8)  Pennsaid 1.5 % Soln (Diclofenac Sodium) .... 3-5 Gtt On Skin Three Times A Day For Pain 9)  Gi Cocktail  (Equal Parts Maalox, Donnatal, Xylocaine) .Marland Kitchen.. 1 Tbsp 1 6-8 Hrs As Needed 10)  Cyanocobalamin 1000 Mcg/ml Inj Soln (Cyanocobalamin) .Marland Kitchen.. 1 Cc Im Weekly X 3 11)  Carafate 1 Gm/47ml  Susp (Sucralfate) .Marland Kitchen.. 10 Ml P.c and H.s.  Allergies (verified): 1)  ! Darvocet 2)  ! * Latex 3)  Fluoxetine Hcl (Fluoxetine Hcl) 4)  Effexor Xr (Venlafaxine Hcl) 5)  Wellbutrin  Past History:  Past Surgical History: Last updated: 02/26/2009 Cholecystectomy (2003) Hysterectomy (1972) C-Section (1027) Bladder Suspension (2536,6440, 1996) Breast Augmentation (1980) Abdominoplasty (1978) Rt. subacromial bursa injection (3/07) R ankle Fx graft  Family History: Last updated: 03/22/2008 Family History Hypertension F CAD Family History of Colon Cancer:great  grandfather lung cancer uncle Family History of Diabetes: father  Past Medical History: HIATAL HERNIA (ICD-553.3) GERD (ICD-530.81) DIVERTICULOSIS, COLON (ICD-562.10) IRRITABLE BOWEL SYNDROME (ICD-564.1) RECTAL FISSURE (ICD-565.0) RECTAL INCONTINENCE (  ICD-787.6) DEGENERATIVE DISC DISEASE (ICD-722.6) DEPRESSION (ICD-311) OVARIAN CYST (ICD-620.2) Anxiety GYN q 12 months Dr Chevis Pretty Depression  Social History: Married - separated Never Smoked Alcohol Use - yes rare Illicit Drug Use - no Patient does not get regular exercise.  Hepatitis Risk:  no risk noted Sun Exposure-Excessive:  no Seat Belt Use:  yes Sexual History:  currently monogamous  Review of Systems       The patient complains of depression.  The patient  denies anorexia, fever, weight loss, weight gain, vision loss, decreased hearing, hoarseness, chest pain, syncope, dyspnea on exertion, peripheral edema, prolonged cough, headaches, hemoptysis, abdominal pain, melena, hematochezia, severe indigestion/heartburn, hematuria, incontinence, genital sores, muscle weakness, suspicious skin lesions, transient blindness, difficulty walking, unusual weight change, abnormal bleeding, enlarged lymph nodes, angioedema, and breast masses.         A lot of stress with her mother w/Alzheimer  Physical Exam  General:  alert, well-developed, well-nourished, well-hydrated, appropriate dress, normal appearance, healthy-appearing, and cooperative to examination.   Head:  Normocephalic and atraumatic. Eyes:  No corneal or conjunctival inflammation noted. EOMI. Perrla. . Ears:  External ear exam shows no significant lesions or deformities.  Otoscopic examination reveals clear canals, tympanic membranes are intact bilaterally without bulging, retraction, inflammation or discharge. Hearing is grossly normal bilaterally. Nose:  External nasal examination shows no deformity or inflammation. Nasal mucosa are pink and moist without lesions or exudates. Mouth:  Oral mucosa and oropharynx without lesions or exudates.  Teeth in good repair. Neck:  No deformities, masses, or tenderness noted. Lungs:  Normal respiratory effort, chest expands symmetrically. Lungs are clear to auscultation, no crackles or wheezes. Heart:  Normal rate and regular rhythm. S1 and S2 normal without gallop, murmur, click, rub or other extra sounds. Abdomen:  Bowel sounds positive,abdomen soft and non-tender without masses, organomegaly or hernias noted. Msk:  3d R mid-phalanx is swollen and tender Extremities:  No clubbing, cyanosis, edema, or deformity noted with normal full range of motion of all joints.   Neurologic:  No cranial nerve deficits noted. Station and gait are normal. Plantar reflexes are  down-going bilaterally. DTRs are symmetrical throughout. Sensory, motor and coordinative functions appear intact. Skin:  WNL Cervical Nodes:  No lymphadenopathy noted Psych:  Cognition and judgment appear intact. Alert and cooperative with normal attention span and concentration. No apparent delusions, illusions, hallucinations   Impression & Recommendations:  Problem # 1:  PHYSICAL EXAMINATION (ICD-V70.0) Health and age related issues were discussed. Available screening tests and vaccinations were discussed as well. Healthy life style including good diet and exercise was discussed.  The labs were reviewed with the patient.  Orders: EKG w/ Interpretation (93000)  Problem # 2:  IRRITABLE BOWEL SYNDROME (ICD-564.1) Assessment: Unchanged On the regimen of medicine(s) reflected in the chart    Problem # 3:  ANXIETY (ICD-300.00) Assessment: Deteriorated  Her updated medication list for this problem includes:    Xanax 0.25 Mg Tabs (Alprazolam) .Marland Kitchen... 1 by mouth bid as needed    Citalopram Hydrobromide 20 Mg Tabs (Citalopram hydrobromide) .Marland Kitchen... 1 by mouth qd  Problem # 4:  DEPRESSION (ICD-311) Assessment: Deteriorated  Her updated medication list for this problem includes:    Xanax 0.25 Mg Tabs (Alprazolam) .Marland Kitchen... 1 by mouth bid as needed    Citalopram Hydrobromide 20 Mg Tabs (Citalopram hydrobromide) .Marland Kitchen... 1 by mouth qd  Problem # 5:  GERD (ICD-530.81) Assessment: Unchanged  Her updated medication list for this problem includes:    Pantoprazole Sodium 40  Mg Tbec (Pantoprazole sodium) ..... One by mouth in morning    Carafate 1 Gm/42ml Susp (Sucralfate) .Marland KitchenMarland KitchenMarland KitchenMarland Kitchen 10 ml p.c and h.s.  Complete Medication List: 1)  Pantoprazole Sodium 40 Mg Tbec (Pantoprazole sodium) .... One by mouth in morning 2)  Zomig 5 Mg Tabs (Zolmitriptan) .... As needed 3)  Xanax 0.25 Mg Tabs (Alprazolam) .Marland Kitchen.. 1 by mouth bid as needed 4)  Align Caps (Misc intestinal flora regulat) .... Take one capsule by mouth  daily 5)  Lotronex 0.5 Mg Tabs (Alosetron hcl) .Marland Kitchen.. 1 by mouth two times a day 6)  Premarin Dose Unknown  .Marland Kitchen.. 1 by mouth qd 7)  Zomig 5 Mg Soln (Zolmitriptan) .Marland Kitchen.. 1 spr once as needed migraine 8)  Pennsaid 1.5 % Soln (Diclofenac sodium) .... 3-5 gtt on skin three times a day for pain 9)  Gi Cocktail (equal Parts Maalox, Donnatal, Xylocaine)  .Marland Kitchen.. 1 tbsp 1 6-8 hrs as needed 10)  Cyanocobalamin 1000 Mcg/ml Inj Soln (Cyanocobalamin) .Marland Kitchen.. 1 cc im weekly x 3 11)  Carafate 1 Gm/60ml Susp (Sucralfate) .Marland Kitchen.. 10 ml p.c and h.s. 12)  Citalopram Hydrobromide 20 Mg Tabs (Citalopram hydrobromide) .Marland Kitchen.. 1 by mouth qd  Other Orders: Administration Flu vaccine - MCR (G0008) Admin 1st Vaccine (82956)  Patient Instructions: 1)  Please schedule a follow-up appointment in 2 months. Prescriptions: CITALOPRAM HYDROBROMIDE 20 MG TABS (CITALOPRAM HYDROBROMIDE) 1 by mouth qd  #30 x 6   Entered and Authorized by:   Tresa Garter MD   Signed by:   Tresa Garter MD on 05/23/2010   Method used:   [facility]        [facility]  [address] [phone][facility] (retail)       [address]       [place], [place]  [address]       Ph: [phone] or [phone]       Fax: [phone]   RxID:   [id number]    Orders Added: 1)  EKG w/ Interpretation [93000] 2)  Administration Flu vaccine - MCR [G0008] 3)  Admin 1st Vaccine [90471] 4)  Est. Patient 65& > [84166]   Immunizations Administered:  Influenza Vaccine # 1:    Vaccine Type: Fluvax MCR    Site: left deltoid    Mfr: [employer]oSmithK[employer]    Dose: 0.5 ml    Route: IM    Given by: Lanier Prude, CMA(AAMA)    Exp. Date: 12/20/2010    Lot #: [id number]    VIS given: 01/14/10 version given May 23, 2010.   Immunizations Administered:  Influenza Vaccine # 1:    Vaccine Type: Fluvax MCR    Site: left deltoid    Mfr: GlaxoSmithKline    Dose: 0.5 ml    Route: IM    Given by: Lanier Prude, CMA(AAMA)    Exp.  Date: 12/20/2010    Lot #: [id number]    VIS given: 01/14/10 version given May 23, 2010.

## 2010-07-22 NOTE — Progress Notes (Signed)
Summary: Medication refill  Phone Note Call from Patient Call back at Home Phone [phone]   Caller: Patient Call For: Dr. Jarold Motto Reason for Call: Refill Medication, Referral Summary of Call: Needs a refill on her Lotronex sent  to [facility]....wants to know if we have samples until she gets her script Initial call taken by: Karna Christmas,  February 04, 2010 3:25 PM  Follow-up for Phone Call        LM for pt to call.  No samples available.  See lab results also.  Lupita Leash Surface RN  February 05, 2010 8:54 AM  [facility]   Pt notified. Follow-up by: Ashok Cordia RN,  February 05, 2010 10:20 AM    Prescriptions: LOTRONEX 0.5 MG  TABS (ALOSETRON HCL) 1 by mouth two times a day  #180 x 3   Entered by:   Ashok Cordia RN   Authorized by:   Mardella Layman MD [facility]   Signed by:   Ashok Cordia RN on 02/05/2010   Method used:   Faxed to ...       [facility] (mail-order)             , [place]         Ph: [phone]       Fax: [phone]   RxID:   [id number]

## 2010-07-22 NOTE — Assessment & Plan Note (Signed)
Summary: MONTLY B12 SHOT/JMS  Nurse Visit   Allergies: 1)  ! Darvocet 2)  ! * Latex 3)  Fluoxetine Hcl (Fluoxetine Hcl) 4)  Effexor Xr (Venlafaxine Hcl)  Medication Administration  Injection # 1:    Medication: Vit B12 1000 mcg    Diagnosis: B12 DEFICIENCY (ICD-266.2)    Route: IM    Site: L deltoid    Exp Date: 08/13    Lot #: [id number]    Mfr: [employer]    Patient tolerated injection without complications    Given by: Milford Cage NCMA (May 01, 2010 11:17 AM)  Orders Added: 1)  Vit B12 1000 mcg [J3420]

## 2010-07-22 NOTE — Assessment & Plan Note (Signed)
Summary: #1 of 3 weekly B12/266.2/dfs  Nurse Visit   Allergies: 1)  ! Darvocet 2)  ! * Latex 3)  Fluoxetine Hcl (Fluoxetine Hcl) 4)  Effexor Xr (Venlafaxine Hcl)  Medication Administration  Injection # 1:    Medication: Vit B12 1000 mcg    Diagnosis: B12 DEFICIENCY (ICD-266.2)    Route: IM    Site: R deltoid    Exp Date: 11/2011    Lot #: [id number]    Mfr: [employer]    Comments: pt to schedule number 2 of 3 weekly at the front desk  pt complained of pain when injection given    Patient tolerated injection without complications    Given by: Chales Abrahams CMA Duncan Dull) (February 11, 2010 11:19 AM)  Orders Added: 1)  Vit B12 1000 mcg [J3420]

## 2010-07-22 NOTE — Progress Notes (Signed)
Summary: Alprazolam refill  Phone Note Refill Request Message from:  Fax from Pharmacy on Oct 30, 2009 2:45 PM  Refills Requested: Medication #1:  XANAX 0.25 MG TABS 1 by mouth bid as needed Initial call taken by: Lucious Groves,  Oct 30, 2009 2:45 PM  Follow-up for Phone Call        ok x 3  Needs OV soon ?well exam Follow-up by: Tresa Garter MD,  Oct 30, 2009 5:26 PM    Prescriptions: Prudy Feeler 0.25 MG TABS (ALPRAZOLAM) 1 by mouth bid as needed  #60 x 3   Entered by:   Lamar Sprinkles, CMA   Authorized by:   Tresa Garter MD   Signed by:   Lamar Sprinkles, CMA on 10/30/2009   Method used:   Telephoned to ...       [facility]  [address] [phone]* (retail)       [address]       [place], [place]  [address]       Ph: [phone] or [phone]       Fax: [phone]   RxID:   [id number]

## 2010-07-22 NOTE — Assessment & Plan Note (Signed)
Summary: #2 of 3 weekly B12/266.2/dfs  Nurse Visit   Allergies: 1)  ! Darvocet 2)  ! * Latex 3)  Fluoxetine Hcl (Fluoxetine Hcl) 4)  Effexor Xr (Venlafaxine Hcl)  Medication Administration  Injection # 1:    Medication: Vit B12 1000 mcg    Diagnosis: B12 DEFICIENCY (ICD-266.2)    Route: IM    Site: L deltoid    Exp Date: 11/2011    Lot #: [id number]    Mfr: [employer]    Patient tolerated injection without complications    Given by: Christie Nottingham CMA (AAMA) (February 18, 2010 11:18 AM)  Orders Added: 1)  Vit B12 1000 mcg [J3420]

## 2010-07-22 NOTE — Progress Notes (Signed)
Summary: appt---  ---- Converted from flag ---- ---- 10/30/2009 6:11 PM, Lamar Sprinkles, CMA wrote: Pt is due for follow up soon, ?CPX? Please check last cpx and schedule pt for office visit (Cpx if over 1 year)  THANK YOU! ------------------------------ Not over a yr for cpx--Scheduled follow up:  11/01/09 @ 1030A w/Dr Plotnikov--phone

## 2010-07-22 NOTE — Progress Notes (Signed)
Summary: Xanax  Phone Note Call from Patient Call back at [facility] Phone [phone]   Summary of Call: Should pt d/c gen xanax? Or gradually taper since starting gen celexa?  Initial call taken by: Lamar Sprinkles, CMA,  May 26, 2010 9:08 AM  Follow-up for Phone Call        Taper off Xanax gradually to PRN use only Follow-up by: Tresa Garter MD,  May 26, 2010 1:19 PM  Additional Follow-up for Phone Call Additional follow up Details #1::        Left detailed vm on pt's home # Additional Follow-up by: Lamar Sprinkles, CMA,  May 26, 2010 1:58 PM

## 2010-07-22 NOTE — Assessment & Plan Note (Signed)
Summary: MONTHLY B12 INJECTION//266.2..SP  Nurse Visit   Allergies: 1)  ! Darvocet 2)  ! * Latex 3)  Fluoxetine Hcl (Fluoxetine Hcl) 4)  Effexor Xr (Venlafaxine Hcl)  Medication Administration  Injection # 1:    Medication: Vistaril 25mg     Diagnosis: B12 DEFICIENCY (ICD-266.2)    Route: IM    Site: R deltoid    Exp Date: 12/2011    Lot #: [id number]    Mfr: [employer]    Patient tolerated injection without complications    Given by: Milford Cage NCMA (March 31, 2010 12:05 PM)  Orders Added: 1)  Vistaril 25mg  [id number]

## 2010-07-24 ENCOUNTER — Ambulatory Visit: Admit: 2010-07-24 | Payer: Self-pay | Admitting: Gastroenterology

## 2010-07-24 NOTE — Progress Notes (Signed)
Summary: Rf Alprazolam  Phone Note Refill Request Message from:  Fax from Pharmacy  Refills Requested: Medication #1:  XANAX 0.25 MG TABS 1 by mouth bid as needed   Dosage confirmed as above?Dosage Confirmed   Supply Requested: 60   Last Refilled: 04/25/2010  Method Requested: Telephone to Pharmacy Next Appointment Scheduled: none Initial call taken by: Lanier Prude, [facility]),  June 05, 2010 2:27 PM  Follow-up for Phone Call        ok 3 ref Follow-up by: Tresa Garter MD,  June 06, 2010 12:33 PM  Additional Follow-up for Phone Call Additional follow up Details #1::        Rx called to pharmacy Additional Follow-up by: Lanier Prude, [facility]),  June 06, 2010 4:54 PM    Prescriptions: Prudy Feeler 0.25 MG TABS (ALPRAZOLAM) 1 by mouth bid as needed  #60 x 2   Entered by:   Lanier Prude, [facility])   Authorized by:   Tresa Garter MD   Signed by:   Lanier Prude, CMA(AAMA) on 06/06/2010   Method used:   Telephoned to ...       [facility]  [address] [phone]* (retail)       [address]       [place], [place]  [address]       Ph: [phone] or [phone]       Fax: [phone]   RxID:   [id number]

## 2010-07-24 NOTE — Progress Notes (Addendum)
Summary: medcation  Phone Note Call from Patient Call back at [facility] Phone [phone]   Caller: Patient Call For: Dr. Jarold Motto Reason for Call: Talk to Nurse Summary of Call: Pt needs a refill on a medication through [facility], but wants to speak with nurse directly about how it needs to be written Initial call taken by: [place] Johnson,  July 17, 2010 12:40 PM  Follow-up for Phone Call        needs printed rx for lotronex with a sticker for a label.  Follow-up by: Harlow Mares CMA (AAMA),  July 17, 2010 1:17 PM    Prescriptions: LOTRONEX 0.5 MG  TABS (ALOSETRON HCL) 1 by mouth two times a day  #180 x 3   Entered by:   Harlow Mares CMA (AAMA)   Authorized by:   Mardella Layman MD [facility]   Signed by:   Harlow Mares CMA (AAMA) on 07/17/2010   Method used:   Print then Give to Patient   RxID:   [id number]   Appended Document: medcation per Harlow Mares, CMA, created prescription was later shredded and never given to patient as Dr Jarold Motto wanted to see her in the office first.

## 2010-07-24 NOTE — Progress Notes (Signed)
Summary: [facility]  Phone Note Refill Request   Refills Requested: Medication #1:  CITALOPRAM HYDROBROMIDE 20 MG TABS 1 by mouth qd. Req refill to go to [facility]. Needs a call back when complete.   Initial call taken by: Lamar Sprinkles, CMA,  July 17, 2010 2:52 PM  Follow-up for Phone Call        Pt informed  Follow-up by: Lamar Sprinkles, CMA,  July 17, 2010 4:05 PM    Prescriptions: CITALOPRAM HYDROBROMIDE 20 MG TABS (CITALOPRAM HYDROBROMIDE) 1 by mouth qd  #90 x 2   Entered by:   Lamar Sprinkles, CMA   Authorized by:   Tresa Garter MD   Signed by:   Lamar Sprinkles, CMA on 07/17/2010   Method used:   Electronically to        [facility]* (retail)             ,          Ph: [phone]       Fax: [phone]   RxID:   [id number]

## 2010-07-24 NOTE — Progress Notes (Signed)
Summary: Lotronex  Phone Note Call from Patient Call back at Home Phone [phone]   Caller: Patient Summary of Call: patient called back and states that she has never started her last rx of Lotronex and was last seen 02/04/2010. says that she only needs rx for once a day. Is it ok to rx for once a day or does she need an office visit.  Initial call taken by: Harlow Mares CMA Duncan Dull),  July 17, 2010 1:44 PM  Follow-up for Phone Call        ov needed Follow-up by: Mardella Layman MD FACG,  July 18, 2010 8:36 AM  Additional Follow-up for Phone Call Additional follow up Details #1::        ov made 2/30/2012 Additional Follow-up by: Harlow Mares CMA (AAMA),  July 18, 2010 8:42 AM

## 2010-07-24 NOTE — Assessment & Plan Note (Signed)
Summary: MONTHLY B12 INJECTION//SP  Nurse Visit   Allergies: 1)  ! Darvocet 2)  ! * Latex 3)  Fluoxetine Hcl (Fluoxetine Hcl) 4)  Effexor Xr (Venlafaxine Hcl) 5)  Wellbutrin  Medication Administration  Injection # 1:    Medication: Vit B12 1000 mcg    Diagnosis: B12 DEFICIENCY (ICD-266.2)    Route: IM    Site: R deltoid    Exp Date: 03/2012    Lot #: [id number]    Mfr: [employer]    Comments: pt to schedule next monthly b12 at front desk    Patient tolerated injection without complications    Given by: Chales Abrahams CMA (AAMA) (June 26, 2010 11:04 AM)  Orders Added: 1)  Vit B12 1000 mcg [J3420]

## 2010-07-24 NOTE — Assessment & Plan Note (Signed)
Summary: STREP? /NWS   Vital Signs:  Patient profile:   65 year old female Height:      64 inches Weight:      147 pounds BMI:     25.32 Temp:     97.9 degrees F oral Pulse rate:   76 / minute Pulse rhythm:   regular Resp:     16 per minute BP sitting:   122 / 82  (left arm) Cuff size:   regular  Vitals Entered By: Lanier Prude, CMA(AAMA) (June 24, 2010 3:10 PM) CC: sore throat, dysphagia, ? white sores on back of throat X 10 days Is Patient Diabetic? No Comments pt is not taking Lotronex, Pennsaid or Gi cocktail   Primary Care Provider:  Sula Soda, MD   CC:  sore throat, dysphagia, and ? white sores on back of throat X 10 days.  History of Present Illness: The patient presents with complaints of sore throat w/o fever several days duration. Not better with OTC meds. No muscle aches are present.  C/o HAs.  Current Medications (verified): 1)  Pantoprazole Sodium 40 Mg Tbec (Pantoprazole Sodium) .... One By Mouth in Morning 2)  Zomig 5 Mg Tabs (Zolmitriptan) .... As Needed 3)  Xanax 0.25 Mg Tabs (Alprazolam) .Marland Kitchen.. 1 By Mouth Bid As Needed 4)  Align   Caps (Misc Intestinal Flora Regulat) .... Take One Capsule By Mouth Daily 5)  Lotronex 0.5 Mg  Tabs (Alosetron Hcl) .Marland Kitchen.. 1 By Mouth Two Times A Day 6)  Premarin Dose Unknown .Marland Kitchen.. 1 By Mouth Qd 7)  Zomig 5 Mg Soln (Zolmitriptan) .Marland Kitchen.. 1 Spr Once As Needed Migraine 8)  Pennsaid 1.5 % Soln (Diclofenac Sodium) .... 3-5 Gtt On Skin Three Times A Day For Pain 9)  Gi Cocktail  (Equal Parts Maalox, Donnatal, Xylocaine) .Marland Kitchen.. 1 Tbsp 1 6-8 Hrs As Needed 10)  Cyanocobalamin 1000 Mcg/ml Inj Soln (Cyanocobalamin) .Marland Kitchen.. 1 Cc Im Weekly X 3 11)  Carafate 1 Gm/24ml  Susp (Sucralfate) .Marland Kitchen.. 10 Ml P.c and H.s. 12)  Citalopram Hydrobromide 20 Mg Tabs (Citalopram Hydrobromide) .Marland Kitchen.. 1 By Mouth Qd  Allergies (verified): 1)  ! Darvocet 2)  ! * Latex 3)  Fluoxetine Hcl (Fluoxetine Hcl) 4)  Effexor Xr (Venlafaxine Hcl) 5)  Wellbutrin  Past  History:  Past Medical History: Last updated: 05/23/2010 HIATAL HERNIA (ICD-553.3) GERD (ICD-530.81) DIVERTICULOSIS, COLON (ICD-562.10) IRRITABLE BOWEL SYNDROME (ICD-564.1) RECTAL FISSURE (ICD-565.0) RECTAL INCONTINENCE (ICD-787.6) DEGENERATIVE DISC DISEASE (ICD-722.6) DEPRESSION (ICD-311) OVARIAN CYST (ICD-620.2) Anxiety GYN q 12 months Dr Chevis Pretty Depression  Review of Systems  The patient denies fever, chest pain, syncope, dyspnea on exertion, and prolonged cough.    Physical Exam  General:  alert, well-developed, well-nourished, well-hydrated, appropriate dress, normal appearance, healthy-appearing, and cooperative to examination.   Mouth:  Erythematous throat and intranasal mucosa c/w URI  Lungs:  Normal respiratory effort, chest expands symmetrically. Lungs are clear to auscultation, no crackles or wheezes. Heart:  Normal rate and regular rhythm. S1 and S2 normal without gallop, murmur, click, rub or other extra sounds. Skin:  WNL Cervical Nodes:  R anterior LN tender and L anterior LN tender.     Impression & Recommendations:  Problem # 1:  PHARYNGITIS, ACUTE (ICD-462) Assessment New  Her updated medication list for this problem includes:    Zithromax Z-pak 250 Mg Tabs (Azithromycin) .Marland Kitchen... As dirrected  Complete Medication List: 1)  Pantoprazole Sodium 40 Mg Tbec (Pantoprazole sodium) .... One by mouth in morning 2)  Zomig 5 Mg  Tabs (Zolmitriptan) .... As needed 3)  Xanax 0.25 Mg Tabs (Alprazolam) .Marland Kitchen.. 1 by mouth bid as needed 4)  Align Caps (Misc intestinal flora regulat) .... Take one capsule by mouth daily 5)  Lotronex 0.5 Mg Tabs (Alosetron hcl) .Marland Kitchen.. 1 by mouth two times a day 6)  Premarin Dose Unknown  .Marland Kitchen.. 1 by mouth qd 7)  Zomig 5 Mg Soln (Zolmitriptan) .Marland Kitchen.. 1 spr once as needed migraine 8)  Pennsaid 1.5 % Soln (Diclofenac sodium) .... 3-5 gtt on skin three times a day for pain 9)  Gi Cocktail (equal Parts Maalox, Donnatal, Xylocaine)  .Marland Kitchen.. 1 tbsp 1 6-8 hrs  as needed 10)  Cyanocobalamin 1000 Mcg/ml Inj Soln (Cyanocobalamin) .Marland Kitchen.. 1 cc im weekly x 3 11)  Carafate 1 Gm/79ml Susp (Sucralfate) .Marland Kitchen.. 10 ml p.c and h.s. 12)  Citalopram Hydrobromide 20 Mg Tabs (Citalopram hydrobromide) .Marland Kitchen.. 1 by mouth qd 13)  Zithromax Z-pak 250 Mg Tabs (Azithromycin) .... As dirrected  Patient Instructions: 1)  Use over-the-counter medicines for "cold": Ricola or Halls for sore throat. Office visit if not better or if worse.  Prescriptions: ZITHROMAX Z-PAK 250 MG TABS (AZITHROMYCIN) as dirrected  #1 x 0   Entered and Authorized by:   Tresa Garter MD   Signed by:   Tresa Garter MD on 06/24/2010   Method used:   Electronically to        [facility]  [address] [phone]* (retail)       [address]       [place], [place]  [id number]       Ph: [phone] or [phone]       Fax: [phone]   RxID:   [id number]    Orders Added: 1)  Est. Patient Level III [40347]

## 2010-07-25 ENCOUNTER — Encounter: Payer: Self-pay | Admitting: Gastroenterology

## 2010-07-25 ENCOUNTER — Ambulatory Visit (INDEPENDENT_AMBULATORY_CARE_PROVIDER_SITE_OTHER): Payer: BC Managed Care – PPO | Admitting: Gastroenterology

## 2010-07-25 DIAGNOSIS — K5900 Constipation, unspecified: Secondary | ICD-10-CM

## 2010-07-30 NOTE — Assessment & Plan Note (Addendum)
Summary: Lotronex follow up & monthly b12 injection/lk   Vital Signs:  Patient profile:   65 year old female Height:      64 inches Weight:      148 pounds BMI:     25.50 Pulse rate:   84 / minute Pulse rhythm:   regular BP sitting:   108 / 54  (left arm)  Vitals Entered By: Milford Cage NCMA (July 25, 2010 11:28 AM)  History of Present Illness Visit Type: Follow-up Visit Primary GI MD: Sheryn Bison MD FACP FAGA Primary Provider: Sula Soda, MD  Requesting Provider: n/a Chief Complaint: Lotronex refill/ follow-up History of Present Illness:   [name] denies any GI complaints as long as she is on latornex 0.5 mg a day. She also takes daily pantoprazole for acid reflux difficulties. Her appetite is good and her weight is stable. She denies melena or hematochezia, and is up-to-date on her colonoscopy exams.   GI Review of Systems      Denies abdominal pain, acid reflux, belching, bloating, chest pain, dysphagia with liquids, dysphagia with solids, heartburn, loss of appetite, nausea, vomiting, vomiting blood, weight loss, and  weight gain.      Reports fecal incontinence.     Denies anal fissure, black tarry stools, change in bowel habit, constipation, diarrhea, diverticulosis, heme positive stool, hemorrhoids, irritable bowel syndrome, jaundice, light color stool, liver problems, rectal bleeding, and  rectal pain.  Current Medications (verified): 1)  Pantoprazole Sodium 40 Mg Tbec (Pantoprazole Sodium) .... One By Mouth in Morning 2)  Xanax 0.25 Mg Tabs (Alprazolam) .... 1/2 Tablet  By Mouth At Bedtime 3)  Align   Caps (Misc Intestinal Flora Regulat) .... Take One Capsule By Mouth Daily 4)  Lotronex 0.5 Mg  Tabs (Alosetron Hcl) .Marland Kitchen.. 1 By Mouth Two Times A Day 5)  Premarin Dose Unknown .Marland Kitchen.. 1 By Mouth Qd 6)  Zomig 5 Mg Soln (Zolmitriptan) .Marland Kitchen.. 1 Spr Once As Needed Migraine 7)  Cyanocobalamin 1000 Mcg/ml Inj Soln (Cyanocobalamin) .Marland Kitchen.. 1 Cc Im Monthly 8)  Carafate 1  Gm/3ml  Susp (Sucralfate) .Marland Kitchen.. 10 Ml P.c and H.s. (On Hold) 9)  Citalopram Hydrobromide 20 Mg Tabs (Citalopram Hydrobromide) .Marland Kitchen.. 1 By Mouth Qd  Allergies (verified): 1)  ! Darvocet 2)  ! * Latex 3)  Fluoxetine Hcl (Fluoxetine Hcl) 4)  Effexor Xr (Venlafaxine Hcl) 5)  Wellbutrin  Past History:  Past medical, surgical, family and social histories (including risk factors) reviewed for relevance to current acute and chronic problems.  Past Medical History: Reviewed history from 05/23/2010 and no changes required. HIATAL HERNIA (ICD-553.3) GERD (ICD-530.81) DIVERTICULOSIS, COLON (ICD-562.10) IRRITABLE BOWEL SYNDROME (ICD-564.1) RECTAL FISSURE (ICD-565.0) RECTAL INCONTINENCE (ICD-787.6) DEGENERATIVE DISC DISEASE (ICD-722.6) DEPRESSION (ICD-311) OVARIAN CYST (ICD-620.2) Anxiety GYN q 12 months Dr Chevis Pretty Depression  Past Surgical History: Reviewed history from 02/26/2009 and no changes required. Cholecystectomy (2003) Hysterectomy (1972) C-Section (1610) Bladder Suspension (9604,5409, 1996) Breast Augmentation (1980) Abdominoplasty (1978) Rt. subacromial bursa injection (3/07) R ankle Fx graft  Family History: Reviewed history from 03/22/2008 and no changes required. Family History Hypertension F CAD Family History of Colon Cancer:great  grandfather lung cancer uncle Family History of Diabetes: father  Social History: Reviewed history from 05/23/2010 and no changes required. Married - separated Never Smoked Alcohol Use - yes rare Illicit Drug Use - no Patient does not get regular exercise.   Review of Systems  The patient denies allergy/sinus, anemia, anxiety-new, arthritis/joint pain, back pain, blood in urine, breast changes/lumps, change in  vision, confusion, cough, coughing up blood, depression-new, fainting, fatigue, fever, headaches-new, hearing problems, heart murmur, heart rhythm changes, itching, menstrual pain, muscle pains/cramps, night sweats,  nosebleeds, pregnancy symptoms, shortness of breath, skin rash, sleeping problems, sore throat, swelling of feet/legs, swollen lymph glands, thirst - excessive , urination - excessive , urination changes/pain, urine leakage, vision changes, and voice change.    Physical Exam  General:  Well developed, well nourished, no acute distress.healthy appearing.   Lungs:  Clear throughout to auscultation. Heart:  Regular rate and rhythm; no murmurs, rubs,  or bruits. Abdomen:  Soft, nontender and nondistended. No masses, hepatosplenomegaly or hernias noted. Normal bowel sounds. Extremities:  No clubbing, cyanosis, edema or deformities noted. Neurologic:  Alert and  oriented x4;  grossly normal neurologically. Psych:  Alert and cooperative. Normal mood and affect.   Impression & Recommendations:  Problem # 1:  DIARRHEA (ICD-787.91) Assessment Improved She Has Diarrhea Predominant IBS with excellent response to latornex 0.5 mg once a day. I renewed her prescription and we'll see her at three-month intervals or p.r.n. as needed.  Problem # 2:  B12 DEFICIENCY (ICD-266.2) Assessment: Improved continue monthly B12 injections. 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  Problem # 4:  DEPRESSION (ICD-311) Assessment: Improved continued citalopram That 20 mg a day.  Patient Instructions: 1)  We have sent a prescription for Lotronex 0.5 mg tablets, 1 tablet by mouth two times a day (although you are only taking 1 tablet daily) to [facility]. Per Ninfa Meeker, pharmacy rep from [facility], prescription may be faxed. Please call our office and ask to speak to Karen Kitchens [phone]) should you have any problems with this prescription. 2)  We have given you a b12 injection today. You will need your next injection in 1 month. 3)  Copy sent to : Dr A Plotnikov 4)  The medication list was reviewed and reconciled.  All changed /  newly prescribed medications were explained.  A complete medication list was provided to the patient / caregiver. 5)  Lotronex patient contract reviewed and signed by patient and physician.  Prescriptions: LOTRONEX 0.5 MG  TABS (ALOSETRON HCL) Take 1 tablet by mouth two times a day  #180 x 1   Entered by:   Lamona Curl CMA (AAMA)   Authorized by:   Mardella Layman MD [facility]   Signed by:   Lamona Curl CMA (AAMA) on 07/25/2010   Method used:   Reprint   RxID:   [id number] LOTRONEX 0.5 MG  TABS (ALOSETRON HCL) Take 1 tablet by mouth two times a day  #180 x 1   Entered by:   Lamona Curl CMA (AAMA)   Authorized by:   Mardella Layman MD [facility]   Signed by:   Lamona Curl CMA (AAMA) on 07/25/2010   Method used:   Electronically to        [facility]* (retail)             ,          Ph: [phone]       Fax: [phone]   RxID:   [id number]    Medication Administration  Injection # 1:    Medication: Vit B12 1000 mcg    Diagnosis: B12 DEFICIENCY (ICD-266.2)    Route: IM    Site: L deltoid    Exp Date: 11/13    Lot #: [id number]    Mfr: American  Regent    Patient tolerated injection without complications    Given by: Lamona Curl CMA (AAMA) (July 25, 2010 12:01 PM)  Orders Added: 1)  Vit B12 1000 mcg [J3420]     Medication Administration  Injection # 1:    Medication: Vit B12 1000 mcg    Diagnosis: B12 DEFICIENCY (ICD-266.2)    Route: IM    Site: L deltoid    Exp Date: 11/13    Lot #: [id number]    Mfr: American Regent    Patient tolerated injection without complications    Given by: Lamona Curl CMA (AAMA) (July 25, 2010 12:01 PM)  Orders Added: 1)  Vit B12 1000 mcg [J3420]

## 2010-08-01 ENCOUNTER — Telehealth: Payer: Self-pay | Admitting: Gastroenterology

## 2010-08-07 NOTE — Progress Notes (Signed)
Summary: Medication  Phone Note Call from Patient Call back at Home Phone [phone]   Caller: Patient Call For: Dr. Jarold Motto Reason for Call: Talk to Nurse Summary of Call: pt. said [facility] is stating they cannot process her LOTRONEX Initial call taken by: Karna Christmas,  August 01, 2010 9:38 AM  Follow-up for Phone Call        rx printed and pt can pick up anytime Follow-up by: Harlow Mares CMA (AAMA),  August 01, 2010 10:40 AM    Prescriptions: LOTRONEX 0.5 MG  TABS (ALOSETRON HCL) Take 1 tablet by mouth two times a day  #180 x 3   Entered by:   Harlow Mares CMA (AAMA)   Authorized by:   Mardella Layman MD [facility]   Signed by:   Harlow Mares CMA (AAMA) on 08/01/2010   Method used:   Reprint   RxID:   [id number]

## 2010-08-11 ENCOUNTER — Telehealth (INDEPENDENT_AMBULATORY_CARE_PROVIDER_SITE_OTHER): Payer: Self-pay | Admitting: *Deleted

## 2010-08-19 ENCOUNTER — Encounter: Payer: Self-pay | Admitting: Internal Medicine

## 2010-08-25 ENCOUNTER — Encounter: Payer: Self-pay | Admitting: Gastroenterology

## 2010-08-25 ENCOUNTER — Encounter (INDEPENDENT_AMBULATORY_CARE_PROVIDER_SITE_OTHER): Payer: BC Managed Care – PPO

## 2010-08-25 DIAGNOSIS — E538 Deficiency of other specified B group vitamins: Secondary | ICD-10-CM

## 2010-08-28 NOTE — Medication Information (Signed)
Summary: Triptan Therapy/[employer]  Triptan Therapy/[employer]   Imported By: Lester Tennille 08/21/2010 07:10:01  _____________________________________________________________________  External Attachment:    Type:   Image     Comment:   External Document

## 2010-09-02 NOTE — Progress Notes (Signed)
Summary: PA-ZOMIG   Phone Note Call from Patient   Reason for Call: Insurance Question Summary of Call: Pt left vm -NEEDS PA on Zomig nasal spray - [phone] Initial call taken by: Lamar Sprinkles, CMA,  August 11, 2010 2:24 PM  Follow-up for Phone Call        Form to Dr Posey Rea to complete. Dagoberto Reef  August 14, 2010 8:13 AM  Pt must have tried sumatriptan, maxalt or amerge (generic) prior to ins considering coverging zomig. I do not see that pt has tried any of these, do you?  Follow-up by: Lamar Sprinkles, CMA,  August 15, 2010 12:12 PM  Additional Follow-up for Phone Call Additional follow up Details #1::        OK to try Maxalt Additional Follow-up by: Tresa Garter MD,  August 17, 2010 11:57 PM    Additional Follow-up for Phone Call Additional follow up Details #2::    Spoke w/pt - maxalt causes dizzynes & nausea. will complete PA forms....................Marland KitchenLamar Sprinkles, CMA  August 18, 2010 3:32 PM   Pa-Zomig faxed to [facility] @ [phone], awaiting approval. Dagoberto Reef  August 19, 2010 12:59 PM PA Appoved, pt aware. Follow-up by: Dagoberto Reef,  August 19, 2010 4:15 PM  New/Updated Medications: MAXALT-MLT 10 MG TBDP (RIZATRIPTAN BENZOATE) 1 by mouth once daily as needed migraine

## 2010-09-02 NOTE — Assessment & Plan Note (Signed)
Summary: B12 SHOT  Nurse Visit   Allergies: 1)  ! Darvocet 2)  ! * Latex 3)  Fluoxetine Hcl (Fluoxetine Hcl) 4)  Effexor Xr (Venlafaxine Hcl) 5)  Wellbutrin  Medication Administration  Injection # 1:    Medication: Vit B12 1000 mcg    Diagnosis: B12 DEFICIENCY (ICD-266.2)    Route: IM    Site: L deltoid    Exp Date: 04/2012    Lot #: [id number]    Mfr: American Regent    Patient tolerated injection without complications    Given by: Milford Cage NCMA (August 25, 2010 11:22 AM)  Orders Added: 1)  Vit B12 1000 mcg [J3420]

## 2010-09-15 ENCOUNTER — Other Ambulatory Visit (HOSPITAL_COMMUNITY): Payer: Self-pay | Admitting: Obstetrics and Gynecology

## 2010-09-15 DIAGNOSIS — N941 Unspecified dyspareunia: Secondary | ICD-10-CM

## 2010-09-18 ENCOUNTER — Ambulatory Visit (HOSPITAL_COMMUNITY)
Admission: RE | Admit: 2010-09-18 | Discharge: 2010-09-18 | Disposition: A | Discharge status: Home / Self Care | Payer: Medicare Other | Source: Ambulatory Visit | Attending: Obstetrics and Gynecology | Admitting: Obstetrics and Gynecology

## 2010-09-18 DIAGNOSIS — IMO0002 Reserved for concepts with insufficient information to code with codable children: Secondary | ICD-10-CM | POA: Insufficient documentation

## 2010-09-18 DIAGNOSIS — N941 Unspecified dyspareunia: Secondary | ICD-10-CM

## 2010-09-18 DIAGNOSIS — Z90711 Acquired absence of uterus with remaining cervical stump: Secondary | ICD-10-CM | POA: Insufficient documentation

## 2010-09-18 MED ORDER — IOHEXOL 300 MG/ML  SOLN
100.0000 mL | Freq: Once | INTRAMUSCULAR | Status: AC | PRN
  Administered 2010-09-18: 100 mL via INTRAVENOUS

## 2010-09-22 ENCOUNTER — Ambulatory Visit (INDEPENDENT_AMBULATORY_CARE_PROVIDER_SITE_OTHER): Payer: BC Managed Care – PPO | Admitting: Gastroenterology

## 2010-09-22 DIAGNOSIS — E538 Deficiency of other specified B group vitamins: Secondary | ICD-10-CM

## 2010-09-22 MED ORDER — CYANOCOBALAMIN 1000 MCG/ML IJ SOLN
1000.0000 ug | INTRAMUSCULAR | Status: AC
  Administered 2010-09-22 – 2011-03-04 (×6): 1000 ug via INTRAMUSCULAR

## 2010-09-28 LAB — POCT HEMOGLOBIN-HEMACUE: Hemoglobin: 12.9 g/dL (ref 12.0–15.0)

## 2010-10-23 ENCOUNTER — Ambulatory Visit (INDEPENDENT_AMBULATORY_CARE_PROVIDER_SITE_OTHER): Payer: Medicare Other | Admitting: Gastroenterology

## 2010-10-23 DIAGNOSIS — E538 Deficiency of other specified B group vitamins: Secondary | ICD-10-CM

## 2010-11-04 NOTE — Op Note (Signed)
NAMEMARILEA, [name]            ACCOUNT NO.:  [id number]   MEDICAL RECORD NO.:  [id number]          PATIENT TYPE:  AMB   LOCATION:  DSC                          FACILITY:  [facility]   PHYSICIAN:  Leonides Grills, M.D.     DATE OF BIRTH:  June 27, 1945   DATE OF PROCEDURE:  12/26/2008  DATE OF DISCHARGE:                               OPERATIVE REPORT   PREOPERATIVE DIAGNOSES:  1. Posttraumatic right subtalar arthritis with deformity.  2. Right tight Achilles tendon.   POSTOPERATIVE DIAGNOSES:  1. Posttraumatic right subtalar arthritis with deformity.  2. Right tight Achilles tendon.   OPERATION:  1. Right subtalar distraction bone block fusion.  2. Right percutaneous tendo-Achilles lengthening.  3. Stress x-rays, right foot.  4. Right iliac crest bone graft.  5. Right sural nerve neurolysis.   ANESTHESIA:  General.   SURGEON:  Leonides Grills, MD   ASSISTANT:  Richardean Canal, PA-C   ESTIMATED BLOOD LOSS:  Minimal.   TOURNIQUET TIME:  About an hour and 15 minutes.   COMPLICATIONS:  None.   DISPOSITION:  Stable to PR.   INDICATIONS:  This is a 65 year old female who sustained a calcaneus  fracture and developed subtalar arthritis with a hind foot deformity.  She also had a horizontal talus with anterior ankle impingement.  She  was consented for the above procedure.  All risks including infection,  neuro vessel injury, nonunion, malunion, hardware irritation, hardware  failure, persistent pain, worsening pain, prolonged recovery, stiffness,  arthritis of juxta-articular joints, DVT, PE were all explained.  Questions were encouraged and answered.   OPERATION:  The patient was brought to the operating room and placed in  supine position.  After adequate general endotracheal tube anesthesia  administered as well as vancomycin 500 mg IV piggyback, the patient was  then placed in a full lateral decubitus position with the operative side  up on a beanbag.  All bony prominences were  well padded, axillary roll  was placed.  The right lower extremity as well as iliac crest bone graft  sites were prepped and draped in sterile manner.  We proximally placed a  thigh tourniquet.  We started procedure with a longitudinal incision  over the iliac crest graft area on the right hip.  Dissection was  carried down through skin.  Hemostasis was obtained.  The inner and  outer table of soft tissue was then elevated, then with a sagittal saw,  2 tricortical bone blocks were then obtained.  Cancellous bone was also  obtained as well.  Her crest was unusually thin in this area, but we  fashioned bone into a trapezoidal-type shape.  This was then placed on  the back table.  The area was copiously irrigated with normal saline.  Marcaine was then instilled into the area without epinephrine.  Deep  tissues were closed with 0 Vicryl.  Subcu was closed with 2-0 Vicryl.  Skin was closed with 4-0 Monocryl subcuticular stitch.  The limb was  then gravity exsanguinated.  Tourniquet was elevated to 290 mmHg, 2  medial and 1 lateral hemisections of Achilles tendon were then  performed.  This had external release of tight Achilles tendon.  We then  made a longitudinal incision on the posterolateral aspect of the right  ankle.  Dissection was carried down through skin.  Hemostasis was  obtained.  There was a scar in this area.  Sural nerve neurolysis was  then performed to mobilize the nerve from the scar and old nice hematoma  from the previous fracture.  Once this was mobilized out of harm's way,  the scar, soft tissue off the posterior aspect of the ankle and subtalar  joint was then elevated or removed, and the subtalar joint was then  entered.  This was verified under C-arm guidance to be the proper joint.  We then released the soft tissues medially and laterally with a wooden  handle elevator and removed the spur from this area as well.  We then  removed the remaining cartilage with curved  corners osteotome, curette,  and synovectomy rongeur.  We then fish scaled either side of the joint  with a curved corners osteotome and mallet.  We then applied the  cancellous graft into the joint as well as Infuse into the joint as  well.  We then placed 2 tricortical bone blocks in this area.  We then  placed one 6.5-mm fully-threaded cancellous screw from an incision  within the calcaneus midline into the talar neck area.  We then placed  another screw from the calcaneus into the body of the talus as well.  Again, this was a 6.5-mm fully-threaded cancellous screw.  This had  excellent purchase and maintenance of the correction.  The ankle was  ranged and had excellent range of motion as well.  We then obtained  stress x-rays in the lateral, mortise view of the ankle and canale view  of the foot as well.  This showed that there was no gross motion  fixation, proposition, and excellent alignment as well.  Hindfoot height  was restored as well as talar inclination.  The area was copiously  irrigated with normal saline.  Tourniquet was deflated.  Hemostasis was  obtained.  Subcu was closed with a 3-0 Vicryl.  Skin was closed with a 4-  0 nylon.  Sterile dressing was applied.  Modified Jones dressing was  applied with the ankle and knee in dorsiflexion.  The patient was stable  to the PR.      Leonides Grills, M.D.  Electronically Signed     Leonides Grills, M.D.  Electronically Signed    PB/MEDQ  D:  12/26/2008  T:  12/27/2008  Job:  [id number]

## 2010-11-04 NOTE — Assessment & Plan Note (Signed)
[facility]                         GASTROENTEROLOGY OFFICE NOTE   [name], [name]                   MRN:          [id number]  DATE:03/15/2007                            DOB:          1946/05/02    The patient has had a recurrence of her diarrhea and incontinence.  She  has had thorough evaluations as per my previous notes for her  incontinence and apparently has had surgical repair of her rectum in  [place], although I really cannot find any record of surgical  procedure.  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   PHYSICAL EXAMINATION:  VITAL SIGNS:  Weight is 140 pounds which is her  normal weight, blood pressure 96/62, pulse 72 and regular.  ABDOMEN:  Benign, but she had scarring of almost entire abdominal wall  from previous surgical procedures and panniculectomy.  Bowel sounds were  normal.  RECTAL:  Inspection of rectum was unremarkable as was rectal examination  and there was soft stool present that was guaiac negative.   RECOMMENDATIONS:  1. Imodium once a day and twice a day as needed for rectal sphincter      control.  2. Trial of Cipro 500 mg twice a day for 10 days along with Align      Probiotic therapy.  3. If diarrhea is not resolved, we will do flexible sigmoidoscopy and      random biopsies to exclude microscopic-collagenous colitis      syndrome.  4. Continue other medications per Georgina Quint. Plotnikov, M.D.     [name], MD, Caleen Essex, FAGA  Electronically Signed    DRP/MedQ  DD: 03/15/2007  DT: 03/16/2007  Job #: [id number]   cc:   Georgina Quint. Plotnikov, MD

## 2010-11-07 NOTE — Op Note (Signed)
[facility]  Patient:    [name], [name] Visit Number: [id number] MRN: [id number]          Service Type: SUR Location: [id number] Attending Physician:  Tempie Donning Dictated by:   Gita Kudo, M.D. Proc. Date: 07/22/01 Admit Date:  07/22/2001   CC:         [name], M.D. [facility]  Viviann Spare R. Randell Patient, M.D.   Operative Report  OPERATIVE PROCEDURE:  Laparoscopic cholecystectomy.  SURGEON:  Gita Kudo, M.D.  ASSISTANT:  Currie Paris, M.D.  ANESTHESIA:  General endotracheal.  PREOPERATIVE DIAGNOSIS:  Gallstones.  POSTOPERATIVE DIAGNOSIS:  Gallstones.  CLINICAL SUMMARY:  A 65 year old female with bouts of abdominal pain and gallstones on ultrasound, admitted for elective cholecystectomy.  She has multiple gallstones, normal liver function studies.  She has had a previous tummy-tuck operation and pelvic surgery including one oophorectomy and questionable scarring on her left side.  OPERATIVE FINDINGS:  A laparoscopic exam of the pelvis was attempted and although not totally successful, I saw no definite masses or abnormalities. She had no significant adhesions of bowel to the pelvic wall.  I did not manipulate the bowel to get a better look.  I did not encounter any mesh during the incisions.  The gallbladder was thin-walled and floppy with multiple gallstones.  There were some small stones at the cystic duct at the area of transection.  An operative cholangiogram was not performed because the duct was small, and her LFTs were normal.  OPERATIVE PROCEDURE:  Under satisfactory general endotracheal anesthesia, having received 1.0 g Ancef, the patients abdomen was prepped and draped in a standard fashion.  A transverse incision made below the umbilicus and the midline identified and incised into the peritoneum.  Controlled with a figure-of-eight 0 Vicryl and operating Hasson port inserted under direct vision.  Good  CO2 pneumoperitoneum established and camera placed.  Under direct vision, through Marcaine-infiltrated skin sites, two #5 ports placed laterally and a second #10 port medially.  With graspers in the lateral ports giving excellent exposure, I took down adhesions operating through the medial port.  The cystic duct gallbladder junction was circumferentially dissected. The cystic duct was well-delineated and while we were certain of its anatomy, it was controlled with multiple metal clips and divided.  Small stones were encountered at the point of division.  There did not appear to be any dilatation of the duct below this point.  The cystic artery was likewise dissected, clipped, and cut.  The gallbladder was then removed from the liver bed from below upward using coagulating spatula for hemostasis and dissection. After removing the gallbladder, the liver bed was checked for hemostasis by cautery.  A plastic EndoCatch bag was placed into the abdomen and the gallbladder put in it for retrieval.  The camera moved to the upper port and then through the lower port, a large grasper used to extract the gallbladder intact and without complication or spillage.  Following this, the operative site was again checked for hemostasis and the entire upper abdomen lavaged with saline and suctioned dry.  The ports were removed under direct vision, and then the CO2 released.  The midline was then closed with the previously-placed figure-of-eight sutures suture as well as a second interrupted 0 Vicryl suture.  The medial upper incision was likewise closed with a single simple fascial suture of 2-0 Vicryl and then the subcu approximated with 4-0 Vicryl and skin edges with Steri-Strips.  Following this, dressings  were applied, and the patient went to the recovery room from the operating room in good condition.  There were complications, and the sponge and needle counts were correct. Dictated by:   Gita Kudo, M.D. Attending Physician:  Tempie Donning DD:  07/22/01 TD:  07/23/01 Job: [id number]

## 2010-11-07 NOTE — Assessment & Plan Note (Signed)
[name]                           GASTROENTEROLOGY OFFICE NOTE   [name], [name]                   MRN:          [id number]  DATE:04/22/2006                            DOB:          1945/11/29    [name] is having recurrence of her diarrhea, unresponsive to [place]  suppositories.  On review of her chart, she has had bacterial overgrowth  syndrome confirmed at [facility].  Seemed to respond well to antibiotics  with her last Xifaxin treatment in February 2006.  She also has chronic acid  reflux and takes daily Protonix.   1. I sent her by the lab to check screening laboratory parameters      including Sprue panel, CBC, thyroid function tests and metabolic      profile along with B12, folate and iron levels.  2. Renew prescriptions.  3. Prescribed Xifaxin 400 mg t.i.d. x10 days along with daily Align.      After that, she is to take chronic erythromycin 50 mg at bedtime to      stimulate MMC movements in her intestine and hopefully decrease the      amount of current bacterial overgrowth syndrome problems.     [name], MD, Caleen Essex, FAGA  Electronically Signed    DRP/MedQ  DD: 04/22/2006  DT: 04/22/2006  Job #: [id number]   cc:   Georgina Quint. Plotnikov, MD

## 2010-11-24 ENCOUNTER — Ambulatory Visit (INDEPENDENT_AMBULATORY_CARE_PROVIDER_SITE_OTHER): Payer: Medicare Other | Admitting: Gastroenterology

## 2010-11-24 DIAGNOSIS — E538 Deficiency of other specified B group vitamins: Secondary | ICD-10-CM

## 2010-12-16 ENCOUNTER — Other Ambulatory Visit: Payer: Self-pay | Admitting: Gastroenterology

## 2010-12-29 ENCOUNTER — Ambulatory Visit (INDEPENDENT_AMBULATORY_CARE_PROVIDER_SITE_OTHER): Payer: Medicare Other | Admitting: Gastroenterology

## 2010-12-29 DIAGNOSIS — E538 Deficiency of other specified B group vitamins: Secondary | ICD-10-CM

## 2011-02-03 ENCOUNTER — Ambulatory Visit (INDEPENDENT_AMBULATORY_CARE_PROVIDER_SITE_OTHER): Payer: Medicare Other | Admitting: Gastroenterology

## 2011-02-03 DIAGNOSIS — E538 Deficiency of other specified B group vitamins: Secondary | ICD-10-CM

## 2011-03-02 ENCOUNTER — Telehealth: Payer: Self-pay | Admitting: *Deleted

## 2011-03-02 MED ORDER — ZOLMITRIPTAN 5 MG NA SOLN: 1.0000 | NASAL | Status: DC | PRN

## 2011-03-02 NOTE — Telephone Encounter (Signed)
Pt calling Req rf on Zomig nasal spray. Rf phoned in to [facility].

## 2011-03-04 ENCOUNTER — Ambulatory Visit (INDEPENDENT_AMBULATORY_CARE_PROVIDER_SITE_OTHER): Payer: Medicare Other | Admitting: Gastroenterology

## 2011-03-04 DIAGNOSIS — E538 Deficiency of other specified B group vitamins: Secondary | ICD-10-CM

## 2011-03-23 ENCOUNTER — Telehealth: Payer: Self-pay | Admitting: Gastroenterology

## 2011-03-23 MED ORDER — ALOSETRON HCL 0.5 MG PO TABS: 0.5000 mg | ORAL_TABLET | Freq: Two times a day (BID) | ORAL | Status: DC

## 2011-03-23 NOTE — Telephone Encounter (Signed)
Pt reports she's in her [facility] and if she gets a 3 month supply of Lotronex it will cost $1800. Pt would like for [place] to order 2 months supply or 120 tabs through [facility] . Ordered.

## 2011-03-25 ENCOUNTER — Telehealth: Payer: Self-pay | Admitting: Gastroenterology

## 2011-03-25 MED ORDER — ALOSETRON HCL 0.5 MG PO TABS: 0.5000 mg | ORAL_TABLET | Freq: Two times a day (BID) | ORAL | Status: DC

## 2011-03-25 NOTE — Telephone Encounter (Signed)
lmom that I will call [facility].

## 2011-03-25 NOTE — Telephone Encounter (Signed)
Contacted [facility] and they need the Lotronex order mailed to them. Mailed to: [employer], [address], [place], [place] [address]. Notified pt.

## 2011-03-27 ENCOUNTER — Telehealth: Payer: Self-pay | Admitting: Gastroenterology

## 2011-03-27 NOTE — Telephone Encounter (Signed)
Pt wants to cx message

## 2011-03-27 NOTE — Telephone Encounter (Signed)
Pt cancelled the call.

## 2011-03-31 ENCOUNTER — Telehealth: Payer: Self-pay | Admitting: *Deleted

## 2011-03-31 NOTE — Telephone Encounter (Signed)
Received a fax from [facility] requesting a signature on Lotronex script. LMOM for pt to call about this.

## 2011-03-31 NOTE — Telephone Encounter (Signed)
Pt reports she doesn't know why [facility] sent the fax. Spoke with a Teacher, early years/pre at [employer], [phone], who stated the fax was in response to a known contraindication between Lotronex and Diverticulosis and was the md aware. Looked at pt's chart in [facility] and [facility] and saw no reports of diverticulitis since pt started the lotronex in 2011. [facility] will process the order. Informed pt.

## 2011-04-08 ENCOUNTER — Ambulatory Visit (INDEPENDENT_AMBULATORY_CARE_PROVIDER_SITE_OTHER): Payer: Medicare Other | Admitting: Gastroenterology

## 2011-04-08 DIAGNOSIS — E538 Deficiency of other specified B group vitamins: Secondary | ICD-10-CM

## 2011-04-08 MED ORDER — CYANOCOBALAMIN 1000 MCG/ML IJ SOLN
1000.0000 ug | INTRAMUSCULAR | Status: DC
  Administered 2011-04-08 – 2012-01-21 (×8): 1000 ug via INTRAMUSCULAR

## 2011-04-14 ENCOUNTER — Other Ambulatory Visit: Payer: Self-pay | Admitting: Gastroenterology

## 2011-04-14 NOTE — Telephone Encounter (Signed)
lmom for pt to call back; won't to make sure she wants refill from [facility].

## 2011-04-15 ENCOUNTER — Telehealth: Payer: Self-pay | Admitting: Gastroenterology

## 2011-04-15 MED ORDER — PANTOPRAZOLE SODIUM 40 MG PO TBEC: DELAYED_RELEASE_TABLET | ORAL | Status: DC

## 2011-04-15 NOTE — Telephone Encounter (Signed)
See previous note

## 2011-04-15 NOTE — Telephone Encounter (Addendum)
Ordered med from [employer]. Pt has had correspondence with Dr Jarold Motto recently for GI Issues- note on script for OV to renew Pantoprazole.

## 2011-05-11 ENCOUNTER — Ambulatory Visit (INDEPENDENT_AMBULATORY_CARE_PROVIDER_SITE_OTHER): Payer: Medicare Other | Admitting: Gastroenterology

## 2011-05-11 DIAGNOSIS — E538 Deficiency of other specified B group vitamins: Secondary | ICD-10-CM

## 2011-06-02 ENCOUNTER — Ambulatory Visit: Payer: Medicare Other

## 2011-06-04 ENCOUNTER — Ambulatory Visit (INDEPENDENT_AMBULATORY_CARE_PROVIDER_SITE_OTHER): Payer: Medicare Other | Admitting: *Deleted

## 2011-06-04 DIAGNOSIS — Z23 Encounter for immunization: Secondary | ICD-10-CM

## 2011-06-10 ENCOUNTER — Telehealth: Payer: Self-pay | Admitting: Internal Medicine

## 2011-06-10 MED ORDER — CITALOPRAM HYDROBROMIDE 20 MG PO TABS: 20.0000 mg | ORAL_TABLET | Freq: Every day | ORAL | Status: DC

## 2011-06-10 NOTE — Telephone Encounter (Signed)
The pt called and is requesting a refill of generic celexa.  However, i dont see on her med list where she is taking this med.  She stated she gets this med through [employer].    Thanks!

## 2011-06-11 ENCOUNTER — Ambulatory Visit (INDEPENDENT_AMBULATORY_CARE_PROVIDER_SITE_OTHER): Payer: Medicare Other | Admitting: Gastroenterology

## 2011-06-11 DIAGNOSIS — E538 Deficiency of other specified B group vitamins: Secondary | ICD-10-CM

## 2011-07-23 ENCOUNTER — Ambulatory Visit (INDEPENDENT_AMBULATORY_CARE_PROVIDER_SITE_OTHER): Payer: Medicare Other | Admitting: Gastroenterology

## 2011-07-23 DIAGNOSIS — E538 Deficiency of other specified B group vitamins: Secondary | ICD-10-CM

## 2011-08-18 ENCOUNTER — Ambulatory Visit (INDEPENDENT_AMBULATORY_CARE_PROVIDER_SITE_OTHER): Payer: Medicare Other | Admitting: Gastroenterology

## 2011-08-18 ENCOUNTER — Encounter: Payer: Self-pay | Admitting: Gastroenterology

## 2011-08-18 VITALS — BP 132/76 | HR 80 | Ht 63.0 in | Wt 155.0 lb

## 2011-08-18 DIAGNOSIS — R197 Diarrhea, unspecified: Secondary | ICD-10-CM

## 2011-08-18 DIAGNOSIS — E538 Deficiency of other specified B group vitamins: Secondary | ICD-10-CM

## 2011-08-18 DIAGNOSIS — K219 Gastro-esophageal reflux disease without esophagitis: Secondary | ICD-10-CM

## 2011-08-18 DIAGNOSIS — K589 Irritable bowel syndrome without diarrhea: Secondary | ICD-10-CM | POA: Insufficient documentation

## 2011-08-18 MED ORDER — DIPHENOXYLATE-ATROPINE 2.5-0.025 MG PO TABS: 1.0000 | ORAL_TABLET | Freq: Two times a day (BID) | ORAL | Status: DC

## 2011-08-18 MED ORDER — COLESEVELAM HCL 625 MG PO TABS: 1875.0000 mg | ORAL_TABLET | Freq: Two times a day (BID) | ORAL | Status: DC

## 2011-08-18 NOTE — Patient Instructions (Signed)
Your procedure has been scheduled for 08/24/2011, please follow the seperate instructions.  Your prescription(s) have been sent to you pharmacy.  Given a FODMAP diet.

## 2011-08-18 NOTE — Progress Notes (Signed)
This is a very difficult patient with chronic diarrhea of unexplained etiology. She has had extensive previous GI evaluations which have been negative her last colonoscopy and colon biopsies 2 years ago. She has been treated with Lotronex 0.5 mg twice a day with good response but cannot afford this medication. Therefore, he continues with urgent diarrhea without abdominal cramping, bleeding, gas or bloating or other upper GI or hepatobiliary complaints. She is status post cholecystectomy. The patient does have associated B12 deficiency and is on B12 shots. As the past history of hepatitis or pancreatitis. For her GERD she takes protonic 40 mg a day, Zomig for migraine headaches, and Celexa 20 mg a day. She denies use of sorbitol fructose or any history of lactose intolerance or family history of celiac disease or other gastrointestinal problems. There are no associated skin rashes, joint pains, or oral stomatitis. She has such severe urgency that she will at times soil herself. Her diarrhea has not abated in the past with discontinuation of PPI medications.  Current Medications, Allergies, Past Medical History, Past Surgical History, Family History and Social History were reviewed in [employer] record.  Pertinent Review of Systems Negative   Physical Exam: Depressed appearing patient in no acute distress. Blood pressure 132/78 and BMI is 27.46. I cannot appreciate stigmata of chronic liver disease or thyromegaly. Chest is clear she is in a regular rhythm without murmurs gallops or rubs. There is no abdominal distention, organomegaly, masses or tenderness. Bowel sounds are normal. Mental status is normal and peripheral extremities are unremarkable.    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 We will repeat her flexible sigmoidoscopy with biopsies for  microscopic-collagenous colitis. I placed her on a  FODMAP  IBS diet, WelChol 625 mg twice a day, and Lomotil one tablet twice a day. Obviously, we cannot provide this patient's medications for her. This somewhat limits her therapeutic options. Encounter Diagnosis  Name Primary?  . Diarrhea Yes

## 2011-08-19 ENCOUNTER — Encounter: Payer: Self-pay | Admitting: Gastroenterology

## 2011-08-22 ENCOUNTER — Telehealth: Payer: Self-pay | Admitting: Internal Medicine

## 2011-08-22 NOTE — Telephone Encounter (Signed)
I gave her prep instructions reading from letter in chart.

## 2011-08-24 ENCOUNTER — Encounter: Payer: Self-pay | Admitting: Gastroenterology

## 2011-08-24 ENCOUNTER — Ambulatory Visit (AMBULATORY_SURGERY_CENTER): Payer: Medicare Other | Admitting: Gastroenterology

## 2011-08-24 VITALS — BP 135/57 | HR 87 | Temp 98.4°F | Resp 20 | Ht 63.0 in | Wt 155.0 lb

## 2011-08-24 DIAGNOSIS — D126 Benign neoplasm of colon, unspecified: Secondary | ICD-10-CM

## 2011-08-24 DIAGNOSIS — R1013 Epigastric pain: Secondary | ICD-10-CM

## 2011-08-24 DIAGNOSIS — Z860100 Personal history of colon polyps, unspecified: Secondary | ICD-10-CM

## 2011-08-24 DIAGNOSIS — R197 Diarrhea, unspecified: Secondary | ICD-10-CM

## 2011-08-24 DIAGNOSIS — K5730 Diverticulosis of large intestine without perforation or abscess without bleeding: Secondary | ICD-10-CM

## 2011-08-24 HISTORY — DX: Personal history of colon polyps, unspecified: Z86.0100

## 2011-08-24 HISTORY — DX: Personal history of colonic polyps: Z86.010

## 2011-08-24 MED ORDER — SODIUM CHLORIDE 0.9 % IV SOLN: 500.0000 mL | INTRAVENOUS | Status: DC

## 2011-08-24 NOTE — Patient Instructions (Signed)
Discharge instructions given with verbal understanding. Handouts on diverticulosis and a high fiber diet given. Resume previous medications.YOU HAD AN ENDOSCOPIC PROCEDURE TODAY AT THE [facility]: Refer to the procedure report that was given to you for any specific questions about what was found during the examination.  If the procedure report does not answer your questions, please call your gastroenterologist to clarify.  If you requested that your care partner not be given the details of your procedure findings, then the procedure report has been included in a sealed envelope for you to review at your convenience later.  YOU SHOULD EXPECT: Some feelings of bloating in the abdomen. Passage of more gas than usual.  Walking can help get rid of the air that was put into your GI tract during the procedure and reduce the bloating. If you had a lower endoscopy (such as a colonoscopy or flexible sigmoidoscopy) you may notice spotting of blood in your stool or on the toilet paper. If you underwent a bowel prep for your procedure, then you may not have a normal bowel movement for a few days.  DIET: Your first meal following the procedure should be a light meal and then it is ok to progress to your normal diet.  A half-sandwich or bowl of soup is an example of a good first meal.  Heavy or fried foods are harder to digest and may make you feel nauseous or bloated.  Likewise meals heavy in dairy and vegetables can cause extra gas to form and this can also increase the bloating.  Drink plenty of fluids but you should avoid alcoholic beverages for 24 hours.  ACTIVITY: Your care partner should take you home directly after the procedure.  You should plan to take it easy, moving slowly for the rest of the day.  You can resume normal activity the day after the procedure however you should NOT DRIVE or use heavy machinery for 24 hours (because of the sedation medicines used during the test).    SYMPTOMS TO  REPORT IMMEDIATELY: A gastroenterologist can be reached at any hour.  During normal business hours, 8:30 AM to 5:00 PM Monday through Friday, call [phone].  After hours and on weekends, please call the GI answering service at [phone] who will take a message and have the physician on call contact you.   Following lower endoscopy (colonoscopy or flexible sigmoidoscopy):  Excessive amounts of blood in the stool  Significant tenderness or worsening of abdominal pains  Swelling of the abdomen that is new, acute  Fever of 100F or higher  FOLLOW UP: If any biopsies were taken you will be contacted by phone or by letter within the next 1-3 weeks.  Call your gastroenterologist if you have not heard about the biopsies in 3 weeks.  Our staff will call the home number listed on your records the next business day following your procedure to check on you and address any questions or concerns that you may have at that time regarding the information given to you following your procedure. This is a courtesy call and so if there is no answer at the home number and we have not heard from you through the emergency physician on call, we will assume that you have returned to your regular daily activities without incident.  SIGNATURES/CONFIDENTIALITY: You and/or your care partner have signed paperwork which will be entered into your electronic medical record.  These signatures attest to the fact that that the information above on your   After Visit Summary has been reviewed and is understood.  Full responsibility of the confidentiality of this discharge information lies with you and/or your care-partner.   

## 2011-08-24 NOTE — Op Note (Signed)
[facility] [address] [place], [place]  [address]  FLEXIBLE SIGMOIDOSCOPY PROCEDURE REPORT  PATIENT:  [name], [name]  MR#:  [id number] BIRTHDATE:  22-Feb-1946, 65 yrs. old  GENDER:  female  ENDOSCOPIST:  [name], MD, [facility] Referred by:  PROCEDURE DATE:  08/24/2011 PROCEDURE:  Flexible Sigmoidoscopy with biopsy ASA CLASS:  Class II INDICATIONS:  unexplained diarrhea  MEDICATIONS:   propofol (Diprivan) 250 mg IV  DESCRIPTION OF PROCEDURE:   After the risks benefits and alternatives of the procedure were thoroughly explained, informed consent was obtained.  Digital rectal exam was performed and revealed no abnormalities.   The [id number] endoscope was introduced through the anus and advanced to the descending colon, limited by poor preparation.   The quality of the prep was adequate.  The instrument was then slowly withdrawn as the mucosa was fully examined. <<PROCEDUREIMAGES>>  Mild diverticulosis was found.  The examination was otherwise normal. RANDOM BIOPSIES DONE.   Retroflexed views in the rectum revealed no abnormalities.    The scope was then withdrawn from the patient and the procedure terminated.  COMPLICATIONS:  None  ENDOSCOPIC IMPRESSION: 1) Mild diverticulosis 2) Otherwise normal examination. R/O MICROSCOPIC/COLLAGENOUS COLITIS. RECOMMENDATIONS: 1) continue current meds 2) await biopsy results  REPEAT EXAM:  No  ______________________________ [name], MD, Clementeen Graham  CC:  Linda Hedges. Plotnikov, MD  n. Rosalie DoctorVania Rea. Zorina Mallin at 08/24/2011 02:25 PM  Nadara Mustard, [phone]

## 2011-08-24 NOTE — Progress Notes (Signed)
Patient did not experience any of the following events: a burn prior to discharge; a fall within the facility; wrong site/side/patient/procedure/implant event; or a hospital transfer or hospital admission upon discharge from the facility. (G8907) Patient did not have preoperative order for IV antibiotic SSI prophylaxis. (G8918)  

## 2011-08-25 ENCOUNTER — Telehealth: Payer: Self-pay

## 2011-08-25 NOTE — Telephone Encounter (Signed)
Left message

## 2011-08-28 ENCOUNTER — Encounter: Payer: Self-pay | Admitting: Gastroenterology

## 2011-09-01 ENCOUNTER — Telehealth: Payer: Self-pay | Admitting: Gastroenterology

## 2011-09-01 NOTE — Telephone Encounter (Signed)
Spoke with pt and informed her since she could not afford the Lotronex, he put her on 2 anti-diarrheals: Welchol and Lomotil. Pulled drug sheets for her and mailed them to her; pt stated understanding.

## 2011-09-09 ENCOUNTER — Telehealth: Payer: Self-pay | Admitting: Gastroenterology

## 2011-09-10 ENCOUNTER — Telehealth: Payer: Self-pay | Admitting: Internal Medicine

## 2011-09-10 MED ORDER — COLESEVELAM HCL 625 MG PO TABS: 1875.0000 mg | ORAL_TABLET | Freq: Two times a day (BID) | ORAL | Status: DC

## 2011-09-10 NOTE — Telephone Encounter (Signed)
Notified pt she is to continue Welchol and prn Lomotil; pt stated understanding.

## 2011-09-10 NOTE — Telephone Encounter (Signed)
Pt with SIGMOID on 08/24/11 with path showing Microscopic or Infectious Colitis and you ordered Lotronex. Pt couldn't afford Lotronex and you placed her on Welchol and Lomotil for the diarrhea. Pt reports the diarrhea is better and no cramping. She does report one BM daily and she thinks she's finished. The next time she goes to urinate, she has soiled her panties. She has no refill left on the [facility], does she continue with it and does she need a f/u appt? Thanks.

## 2011-09-10 NOTE — Telephone Encounter (Signed)
Her colon biopsies did not show evidence of microscopic or collagenous colitis. I would continue WelChol with when necessary Imodium use.

## 2011-09-10 NOTE — Telephone Encounter (Signed)
PT NEEDS NEW RX WRITTEN FOR CELEXA (GENERIC) 20MG  TO PICK UP AND MAIL TO [facility].  HER MAIL ORDER PHARMACY HAS CHANGED TO [facility].  SHE WOULD ALSO LIKE A 30 DAY SUPPLY CALLED INTO [facility] ON [address] BECAUSE SHE IS ALMOST OUT.

## 2011-09-10 NOTE — Telephone Encounter (Signed)
Ok Thx 

## 2011-09-15 MED ORDER — CITALOPRAM HYDROBROMIDE 20 MG PO TABS: 20.0000 mg | ORAL_TABLET | Freq: Every day | ORAL | Status: DC

## 2011-09-15 NOTE — Telephone Encounter (Signed)
Rf sent to local pharmacy. Left detailed mess to see if pt wants to p/u written Rx or me to mail.

## 2011-09-15 NOTE — Telephone Encounter (Signed)
Pt informed by Viviano Simas

## 2011-09-28 ENCOUNTER — Encounter: Payer: Self-pay | Admitting: *Deleted

## 2011-10-02 ENCOUNTER — Ambulatory Visit: Payer: Medicare Other | Admitting: Gastroenterology

## 2011-10-08 ENCOUNTER — Telehealth: Payer: Self-pay | Admitting: Gastroenterology

## 2011-10-08 NOTE — Telephone Encounter (Signed)
Message copied by Arna Snipe on Thu Oct 08, 2011  8:56 AM ------      Message from: Harlow Mares D      Created: Fri Oct 02, 2011 10:37 AM       Please bill patient for same day cx, per Dr Jarold Motto

## 2011-10-14 ENCOUNTER — Other Ambulatory Visit: Payer: Self-pay | Admitting: Gastroenterology

## 2011-10-20 ENCOUNTER — Ambulatory Visit (INDEPENDENT_AMBULATORY_CARE_PROVIDER_SITE_OTHER): Payer: Medicare Other | Admitting: Gastroenterology

## 2011-10-20 ENCOUNTER — Encounter: Payer: Self-pay | Admitting: Gastroenterology

## 2011-10-20 VITALS — BP 112/62 | HR 88 | Ht 63.0 in | Wt 156.5 lb

## 2011-10-20 DIAGNOSIS — E538 Deficiency of other specified B group vitamins: Secondary | ICD-10-CM

## 2011-10-20 DIAGNOSIS — K589 Irritable bowel syndrome without diarrhea: Secondary | ICD-10-CM

## 2011-10-20 DIAGNOSIS — K219 Gastro-esophageal reflux disease without esophagitis: Secondary | ICD-10-CM

## 2011-10-20 DIAGNOSIS — R197 Diarrhea, unspecified: Secondary | ICD-10-CM

## 2011-10-20 DIAGNOSIS — K529 Noninfective gastroenteritis and colitis, unspecified: Secondary | ICD-10-CM

## 2011-10-20 MED ORDER — PANTOPRAZOLE SODIUM 40 MG PO TBEC: DELAYED_RELEASE_TABLET | ORAL | Status: DC

## 2011-10-20 MED ORDER — COLESEVELAM HCL 625 MG PO TABS: ORAL_TABLET | ORAL | Status: DC

## 2011-10-20 MED ORDER — DIPHENOXYLATE-ATROPINE 2.5-0.025 MG PO TABS: 1.0000 | ORAL_TABLET | Freq: Two times a day (BID) | ORAL | Status: DC | PRN

## 2011-10-20 NOTE — Patient Instructions (Signed)
We have sent a 30 day supple of Welchol to your Local pharmacy and a year supply to your Mail order pharmacy.  We have sent a year supply of Lomotil, Protonix to your mail order pharmacy.  You received your b12 today make an appt to come back in one month for yuor next b12 injection.

## 2011-10-20 NOTE — Progress Notes (Signed)
This is a 66 year old Caucasian female with IBS, diarrhea predominant. She has had good response to WelChol 3 tablets twice a day and when necessary Lomotil and/or loperamide. Recent flexible sigmoidoscopy was unremarkable, and random biopsy showed no evidence of microscopic or lymphocytic colitis. She has mild lactose intolerance also. For acid reflux the patient uses when necessary Protonix which I advised her to use regularly.  Current Medications, Allergies, Past Medical History, Past Surgical History, Family History and Social History were reviewed in [employer] record.  Pertinent Review of Systems Negative   Physical Exam: Healthy-appearing patient in no distress. Blood pressure 112/62, weight 156, and pulse 88 and regular. She has a scarred abdomen from previous abdominoplasty. I cannot appreciate organomegaly, masses or tenderness otherwise. Bowel sounds are normal.    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 We will continue all her medications as listed and reviewed. I have urged her to take daily Protonix because of her acid reflux and regurgitation. We will see her on a when necessary basis as needed. Again, she previously had good response to Lotronex, but cannot afford this medication. No diagnosis found.

## 2011-11-20 ENCOUNTER — Ambulatory Visit (INDEPENDENT_AMBULATORY_CARE_PROVIDER_SITE_OTHER): Payer: Medicare Other | Admitting: Gastroenterology

## 2011-11-20 DIAGNOSIS — E538 Deficiency of other specified B group vitamins: Secondary | ICD-10-CM

## 2011-12-21 ENCOUNTER — Ambulatory Visit (INDEPENDENT_AMBULATORY_CARE_PROVIDER_SITE_OTHER): Payer: Medicare Other | Admitting: Gastroenterology

## 2011-12-21 DIAGNOSIS — E538 Deficiency of other specified B group vitamins: Secondary | ICD-10-CM

## 2011-12-22 ENCOUNTER — Ambulatory Visit: Payer: Medicare Other | Admitting: Gastroenterology

## 2012-01-05 ENCOUNTER — Ambulatory Visit: Payer: Medicare Other | Admitting: Internal Medicine

## 2012-01-21 ENCOUNTER — Ambulatory Visit (INDEPENDENT_AMBULATORY_CARE_PROVIDER_SITE_OTHER): Payer: Medicare Other | Admitting: Gastroenterology

## 2012-01-21 DIAGNOSIS — E538 Deficiency of other specified B group vitamins: Secondary | ICD-10-CM

## 2012-02-29 ENCOUNTER — Encounter: Payer: Self-pay | Admitting: Internal Medicine

## 2012-02-29 ENCOUNTER — Ambulatory Visit (INDEPENDENT_AMBULATORY_CARE_PROVIDER_SITE_OTHER): Payer: Medicare Other | Admitting: Internal Medicine

## 2012-02-29 VITALS — BP 112/70 | HR 77 | Temp 97.1°F | Ht 63.0 in | Wt 157.0 lb

## 2012-02-29 DIAGNOSIS — Z23 Encounter for immunization: Secondary | ICD-10-CM

## 2012-02-29 DIAGNOSIS — L309 Dermatitis, unspecified: Secondary | ICD-10-CM

## 2012-02-29 DIAGNOSIS — L259 Unspecified contact dermatitis, unspecified cause: Secondary | ICD-10-CM

## 2012-02-29 MED ORDER — LORATADINE 10 MG PO TABS: 10.0000 mg | ORAL_TABLET | Freq: Every day | ORAL | Status: DC

## 2012-02-29 MED ORDER — TRIAMCINOLONE ACETONIDE 0.1 % EX LOTN: TOPICAL_LOTION | Freq: Three times a day (TID) | CUTANEOUS | Status: DC

## 2012-02-29 NOTE — Progress Notes (Signed)
  Subjective:    Patient ID: [name], female    DOB: [birth year], 66 y.o.   MRN: [id number]  HPI  See cc as above Risk factors: change in bath lotion, new puppy Affects breasts, skin folds at groin and anterior chest  Past Medical History  Diagnosis Date  . Hiatal hernia   . Esophageal reflux   . Diverticulosis of colon (without mention of hemorrhage)   . Irritable bowel syndrome   . Anal fissure   . Incontinence of feces   . Degeneration of intervertebral disc, site unspecified   . Depressive disorder, not elsewhere classified   . Other and unspecified ovarian cyst   . Anxiety   . Depression   . Blind loop syndrome   . Other B-complex deficiencies   . History of colon polyps 08/24/2011    hyperplastic  . Family history of malignant neoplasm of gastrointestinal tract     Review of Systems  Constitutional: Negative for fever and fatigue.  Respiratory: Negative for cough and shortness of breath.   Cardiovascular: Negative for chest pain and palpitations.       Objective:   Physical Exam BP 112/70  Pulse 77  Temp 97.1 F (36.2 C) (Oral)  Ht 5\' 3"  (1.6 m)  Wt 157 lb (71.215 kg)  BMI 27.81 kg/m2  SpO2 97% Gen: NAD Skin: mild eczema changes BUE and anterior chest - no candida or blisters  Lab Results  Component Value Date   WBC 6.2 05/20/2010   HGB 12.7 05/20/2010   HCT 36.3 05/20/2010   PLT 254.0 05/20/2010   GLUCOSE 86 05/20/2010   CHOL 215* 05/20/2010   TRIG 203.0* 05/20/2010   HDL 82.00 05/20/2010   LDLDIRECT 99.1 05/20/2010   ALT 11 05/20/2010   AST 15 05/20/2010   NA 140 05/20/2010   K 4.0 05/20/2010   CL 104 05/20/2010   CREATININE 0.7 05/20/2010   BUN 18 05/20/2010   CO2 28 05/20/2010   TSH 2.53 05/20/2010       Assessment & Plan:   Eczema. Question allergic reaction to new puppy Unimproved with topical antifungal Use steroid lotion and avoid triggers OTC antihistamine follow up if unimproved to consider allergy testing

## 2012-02-29 NOTE — Patient Instructions (Addendum)
It was good to see you today. triamcinolone lotion for itch and rash - also claritin once daily for next 7 days Your prescription(s) have been submitted to your pharmacy. Please take as directed and contact our office if you believe you are having problem(s) with the medication(s). Eczema Atopic dermatitis, or eczema, is an inherited type of sensitive skin. Often people with eczema have a family history of allergies, asthma, or hay fever. It causes a red itchy rash and dry scaly skin. The itchiness may occur before the skin rash and may be very intense. It is not contagious. Eczema is generally worse during the cooler winter months and often improves with the warmth of summer. Eczema usually starts showing signs in infancy. Some children outgrow eczema, but it may last through adulthood. Flare-ups may be caused by:  Eating something or contact with something you are sensitive or allergic to.   Stress.  DIAGNOSIS   The diagnosis of eczema is usually based upon symptoms and medical history. TREATMENT   Eczema cannot be cured, but symptoms usually can be controlled with treatment or avoidance of allergens (things to which you are sensitive or allergic to).  Controlling the itching and scratching.   Use over-the-counter antihistamines as directed for itching. It is especially useful at night when the itching tends to be worse.   Use over-the-counter steroid creams as directed for itching.   Scratching makes the rash and itching worse and may cause impetigo (a skin infection) if fingernails are contaminated (dirty).   Keeping the skin well moisturized with creams every day. This will seal in moisture and help prevent dryness. Lotions containing alcohol and water can dry the skin and are not recommended.   Limiting exposure to allergens.   Recognizing situations that cause stress.   Developing a plan to manage stress.  HOME CARE INSTRUCTIONS    Take prescription and over-the-counter  medicines as directed by your caregiver.   Do not use anything on the skin without checking with your caregiver.   Keep baths or showers short (5 minutes) in warm (not hot) water. Use mild cleansers for bathing. You may add non-perfumed bath oil to the bath water. It is best to avoid soap and bubble bath.   Immediately after a bath or shower, when the skin is still damp, apply a moisturizing ointment to the entire body. This ointment should be a petroleum ointment. This will seal in moisture and help prevent dryness. The thicker the ointment the better. These should be unscented.   Keep fingernails cut short and wash hands often. If your child has eczema, it may be necessary to put soft gloves or mittens on your child at night.   Dress in clothes made of cotton or cotton blends. Dress lightly, as heat increases itching.   Avoid foods that may cause flare-ups. Common foods include cow's milk, peanut butter, eggs and wheat.   Keep a child with eczema away from anyone with fever blisters. The virus that causes fever blisters (herpes simplex) can cause a serious skin infection in children with eczema.  SEEK MEDICAL CARE IF:    Itching interferes with sleep.   The rash gets worse or is not better within one week following treatment.   The rash looks infected (pus or soft yellow scabs).   You or your child has an oral temperature above 102 F (38.9 C).   Your baby is older than 3 months with a rectal temperature of 100.5 F (38.1 C) or higher  for more than 1 day.   The rash flares up after contact with someone who has fever blisters.  SEEK IMMEDIATE MEDICAL CARE IF:    Your baby is older than 3 months with a rectal temperature of 102 F (38.9 C) or higher.   Your baby is older than 3 months or younger with a rectal temperature of 100.4 F (38 C) or higher.  Document Released: 06/05/2000 Document Revised: 05/28/2011 Document Reviewed: 04/10/2009 [facility] Patient Information 2012  [place], [place].

## 2012-03-21 ENCOUNTER — Ambulatory Visit (INDEPENDENT_AMBULATORY_CARE_PROVIDER_SITE_OTHER): Payer: Medicare Other | Admitting: Gastroenterology

## 2012-03-21 DIAGNOSIS — E538 Deficiency of other specified B group vitamins: Secondary | ICD-10-CM

## 2012-03-21 MED ORDER — CYANOCOBALAMIN 1000 MCG/ML IJ SOLN
1000.0000 ug | Freq: Once | INTRAMUSCULAR | Status: AC
  Administered 2012-03-21: 1000 ug via INTRAMUSCULAR

## 2012-04-29 ENCOUNTER — Ambulatory Visit (INDEPENDENT_AMBULATORY_CARE_PROVIDER_SITE_OTHER): Payer: Medicare Other | Admitting: Gastroenterology

## 2012-04-29 DIAGNOSIS — E538 Deficiency of other specified B group vitamins: Secondary | ICD-10-CM

## 2012-04-29 MED ORDER — CYANOCOBALAMIN 1000 MCG/ML IJ SOLN
1000.0000 ug | INTRAMUSCULAR | Status: AC
  Administered 2012-04-29 – 2012-07-12 (×3): 1000 ug via INTRAMUSCULAR

## 2012-05-11 ENCOUNTER — Encounter: Payer: Self-pay | Admitting: Internal Medicine

## 2012-05-11 ENCOUNTER — Ambulatory Visit (INDEPENDENT_AMBULATORY_CARE_PROVIDER_SITE_OTHER)
Admission: RE | Admit: 2012-05-11 | Discharge: 2012-05-11 | Disposition: A | Discharge status: Home / Self Care | Payer: Medicare Other | Source: Ambulatory Visit | Attending: Internal Medicine | Admitting: Internal Medicine

## 2012-05-11 ENCOUNTER — Ambulatory Visit (INDEPENDENT_AMBULATORY_CARE_PROVIDER_SITE_OTHER): Payer: Medicare Other | Admitting: Internal Medicine

## 2012-05-11 VITALS — BP 134/82 | HR 75 | Temp 97.3°F | Resp 16 | Ht 64.0 in | Wt 155.2 lb

## 2012-05-11 DIAGNOSIS — M79672 Pain in left foot: Secondary | ICD-10-CM

## 2012-05-11 DIAGNOSIS — M25511 Pain in right shoulder: Secondary | ICD-10-CM | POA: Insufficient documentation

## 2012-05-11 DIAGNOSIS — M25519 Pain in unspecified shoulder: Secondary | ICD-10-CM

## 2012-05-11 DIAGNOSIS — M79609 Pain in unspecified limb: Secondary | ICD-10-CM

## 2012-05-11 DIAGNOSIS — M79673 Pain in unspecified foot: Secondary | ICD-10-CM | POA: Insufficient documentation

## 2012-05-11 DIAGNOSIS — E538 Deficiency of other specified B group vitamins: Secondary | ICD-10-CM

## 2012-05-11 DIAGNOSIS — K219 Gastro-esophageal reflux disease without esophagitis: Secondary | ICD-10-CM

## 2012-05-11 MED ORDER — TRAMADOL HCL 50 MG PO TABS: 50.0000 mg | ORAL_TABLET | Freq: Two times a day (BID) | ORAL | Status: DC | PRN

## 2012-05-11 MED ORDER — IBUPROFEN 600 MG PO TABS: ORAL_TABLET | ORAL | Status: DC

## 2012-05-11 NOTE — Assessment & Plan Note (Signed)
Continue with current prescription therapy as reflected on the Med list.  

## 2012-05-11 NOTE — Assessment & Plan Note (Signed)
X ray

## 2012-05-11 NOTE — Patient Instructions (Signed)
Postprocedure instructions :    A Band-Aid should be left on for 12 hours. Injection therapy is not a cure itself. It is used in conjunction with other modalities. You can use nonsteroidal anti-inflammatories like ibuprofen , hot and cold compresses. Rest is recommended in the next 24 hours. You need to report immediately  if fever, chills or any signs of infection develop. 

## 2012-05-11 NOTE — Assessment & Plan Note (Signed)
See procedure 

## 2012-05-11 NOTE — Progress Notes (Signed)
  Subjective:    Patient ID: [name], female    DOB: 21-May-1946, 66 y.o.   MRN: [id number]  HPI  C/o L 5th digit pain - stubbed against a cat scratch pole the other day C/o R shoulder pain - chronic, severe lately   Review of Systems  Constitutional: Negative for chills, activity change, appetite change, fatigue and unexpected weight change.  HENT: Negative for congestion, mouth sores and sinus pressure.   Eyes: Negative for visual disturbance.  Respiratory: Negative for cough and chest tightness.   Gastrointestinal: Negative for nausea and abdominal pain.  Genitourinary: Negative for frequency, difficulty urinating and vaginal pain.  Musculoskeletal: Positive for arthralgias and gait problem. Negative for back pain.  Skin: Negative for pallor and rash.  Neurological: Negative for dizziness, tremors, weakness, numbness and headaches.  Psychiatric/Behavioral: Negative for confusion and sleep disturbance.       Objective:   Physical Exam  Constitutional: She appears well-developed. No distress.  HENT:  Head: Normocephalic.  Right Ear: External ear normal.  Left Ear: External ear normal.  Nose: Nose normal.  Mouth/Throat: Oropharynx is clear and moist.  Eyes: Conjunctivae normal are normal. Pupils are equal, round, and reactive to light. Right eye exhibits no discharge. Left eye exhibits no discharge.  Neck: Normal range of motion. Neck supple. No JVD present. No tracheal deviation present. No thyromegaly present.  Cardiovascular: Normal rate, regular rhythm and normal heart sounds.   Pulmonary/Chest: No stridor. No respiratory distress. She has no wheezes.  Abdominal: Soft. Bowel sounds are normal. She exhibits no distension and no mass. There is no tenderness. There is no rebound and no guarding.  Musculoskeletal: She exhibits tenderness. She exhibits no edema.       R shoulder is very tender L 5th toe is tender, swollen  Lymphadenopathy:    She has no cervical  adenopathy.  Neurological: She displays normal reflexes. No cranial nerve deficit. She exhibits normal muscle tone. Coordination normal.  Skin: No rash noted. No erythema.  Psychiatric: She has a normal mood and affect. Her behavior is normal. Judgment and thought content normal.    Procedure :Joint Injection, R  shoulder   Indication:  Subacromial bursitis with refractory  chronic pain.   Risks including unsuccessful procedure , bleeding, infection, bruising, skin atrophy and others were explained to the patient in detail as well as the benefits. Informed consent was obtained and signed.   Tthe patient was placed in a comfortable position. Lateral approach was used. Skin was prepped with Betadine and alcohol  and anesthetized with 2 cc of 2% lidocaine and epinephrine, using a 25-gauge 1-1/2 inch needle. Then, a 5 cc syringe with a 2 inch long 24-gauge needle was used for a joint injection.. The needle was advanced  Into the subacromial space.The bursa was injected with 3 mL of 2% lidocaine and 40 mg of Depo-Medrol .  Band-Aid was applied.   Tolerated well. Complications: None. Good pain relief following the procedure.            Assessment & Plan:

## 2012-05-12 ENCOUNTER — Telehealth: Payer: Self-pay | Admitting: Internal Medicine

## 2012-05-12 NOTE — Telephone Encounter (Signed)
Pt informed

## 2012-05-12 NOTE — Telephone Encounter (Signed)
[name], please, inform patient that there is no fx on  Ray Thx

## 2012-06-09 ENCOUNTER — Ambulatory Visit (INDEPENDENT_AMBULATORY_CARE_PROVIDER_SITE_OTHER): Payer: Medicare Other | Admitting: Gastroenterology

## 2012-06-09 DIAGNOSIS — E538 Deficiency of other specified B group vitamins: Secondary | ICD-10-CM

## 2012-06-24 ENCOUNTER — Ambulatory Visit: Payer: Medicare Other | Admitting: Internal Medicine

## 2012-07-12 ENCOUNTER — Ambulatory Visit (INDEPENDENT_AMBULATORY_CARE_PROVIDER_SITE_OTHER): Payer: Medicare Other | Admitting: Gastroenterology

## 2012-07-12 ENCOUNTER — Other Ambulatory Visit (INDEPENDENT_AMBULATORY_CARE_PROVIDER_SITE_OTHER): Payer: Medicare Other

## 2012-07-12 DIAGNOSIS — E538 Deficiency of other specified B group vitamins: Secondary | ICD-10-CM

## 2012-07-12 LAB — CBC WITH DIFFERENTIAL/PLATELET
Basophils Absolute: 0 10*3/uL (ref 0.0–0.1)
Eosinophils Absolute: 0.1 10*3/uL (ref 0.0–0.7)
HCT: 37.9 % (ref 36.0–46.0)
Hemoglobin: 12.5 g/dL (ref 12.0–15.0)
Lymphs Abs: 2.4 10*3/uL (ref 0.7–4.0)
MCHC: 33 g/dL (ref 30.0–36.0)
MCV: 87.8 fl (ref 78.0–100.0)
Monocytes Absolute: 0.4 10*3/uL (ref 0.1–1.0)
Neutro Abs: 4.1 10*3/uL (ref 1.4–7.7)
RDW: 14.9 % — ABNORMAL HIGH (ref 11.5–14.6)

## 2012-07-12 LAB — FOLATE: Folate: 17.5 ng/mL (ref >5.9)

## 2012-07-12 LAB — IBC PANEL: Transferrin: 280.5 mg/dL (ref 212.0–360.0)

## 2012-07-12 LAB — FERRITIN: Ferritin: 5.8 ng/mL — ABNORMAL LOW (ref 10.0–291.0)

## 2012-07-19 ENCOUNTER — Other Ambulatory Visit: Payer: Self-pay | Admitting: Gastroenterology

## 2012-07-19 DIAGNOSIS — D649 Anemia, unspecified: Secondary | ICD-10-CM

## 2012-07-19 MED ORDER — FERROUS FUM-IRON POLYSACCH 162-115.2 MG PO CAPS: 1.0000 | ORAL_CAPSULE | Freq: Every day | ORAL | Status: DC

## 2012-07-28 ENCOUNTER — Ambulatory Visit (INDEPENDENT_AMBULATORY_CARE_PROVIDER_SITE_OTHER): Payer: Medicare Other | Admitting: Internal Medicine

## 2012-07-28 ENCOUNTER — Encounter: Payer: Self-pay | Admitting: Internal Medicine

## 2012-07-28 ENCOUNTER — Telehealth: Payer: Self-pay | Admitting: Internal Medicine

## 2012-07-28 ENCOUNTER — Ambulatory Visit (INDEPENDENT_AMBULATORY_CARE_PROVIDER_SITE_OTHER)
Admission: RE | Admit: 2012-07-28 | Discharge: 2012-07-28 | Disposition: A | Discharge status: Home / Self Care | Payer: Medicare Other | Source: Ambulatory Visit | Attending: Internal Medicine | Admitting: Internal Medicine

## 2012-07-28 VITALS — BP 110/64 | HR 68 | Temp 98.0°F | Resp 16 | Wt 153.0 lb

## 2012-07-28 DIAGNOSIS — M25579 Pain in unspecified ankle and joints of unspecified foot: Secondary | ICD-10-CM

## 2012-07-28 DIAGNOSIS — E538 Deficiency of other specified B group vitamins: Secondary | ICD-10-CM

## 2012-07-28 DIAGNOSIS — F329 Major depressive disorder, single episode, unspecified: Secondary | ICD-10-CM

## 2012-07-28 DIAGNOSIS — M25572 Pain in left ankle and joints of left foot: Secondary | ICD-10-CM | POA: Insufficient documentation

## 2012-07-28 MED ORDER — IBUPROFEN 600 MG PO TABS: ORAL_TABLET | ORAL | Status: DC

## 2012-07-28 NOTE — Assessment & Plan Note (Signed)
Continue with current prescription therapy as reflected on the Med list.  

## 2012-07-28 NOTE — Assessment & Plan Note (Signed)
2/13 s/p sprain, r/o fx xray elastic brace

## 2012-07-28 NOTE — Telephone Encounter (Signed)
Pt informed

## 2012-07-28 NOTE — Progress Notes (Signed)
Patient ID: [name], female   DOB: 07/28/1945, 67 y.o.   MRN: [id number]  Subjective:    Patient ID: [name], female    DOB: Mar 24, 1946, 67 y.o.   MRN: [id number]  Ankle Injury  The incident occurred more than 1 week ago (3 wks). The incident occurred at home. The injury mechanism was a twisting injury. The pain is present in the left leg. The quality of the pain is described as stabbing. The pain is severe. The pain has been intermittent since onset. Pertinent negatives include no numbness. The symptoms are aggravated by movement.       Review of Systems  Constitutional: Negative for chills, activity change, appetite change, fatigue and unexpected weight change.  HENT: Negative for congestion, mouth sores and sinus pressure.   Eyes: Negative for visual disturbance.  Respiratory: Negative for cough and chest tightness.   Gastrointestinal: Negative for nausea and abdominal pain.  Genitourinary: Negative for frequency, difficulty urinating and vaginal pain.  Musculoskeletal: Positive for arthralgias and gait problem. Negative for back pain.  Skin: Negative for pallor and rash.  Neurological: Negative for dizziness, tremors, weakness, numbness and headaches.  Psychiatric/Behavioral: Negative for confusion and sleep disturbance.       Objective:   Physical Exam  Constitutional: She appears well-developed. No distress.  HENT:  Head: Normocephalic.  Right Ear: External ear normal.  Left Ear: External ear normal.  Nose: Nose normal.  Mouth/Throat: Oropharynx is clear and moist.  Eyes: Conjunctivae normal are normal. Pupils are equal, round, and reactive to light. Right eye exhibits no discharge. Left eye exhibits no discharge.  Neck: Normal range of motion. Neck supple. No JVD present. No tracheal deviation present. No thyromegaly present.  Cardiovascular: Normal rate, regular rhythm and normal heart sounds.   Pulmonary/Chest: No stridor. No respiratory distress. She  has no wheezes.  Abdominal: Soft. Bowel sounds are normal. She exhibits no distension and no mass. There is no tenderness. There is no rebound and no guarding.  Musculoskeletal: She exhibits tenderness. She exhibits no edema.       R shoulder is very tender L 5th toe is tender, swollen  Lymphadenopathy:    She has no cervical adenopathy.  Neurological: She displays normal reflexes. No cranial nerve deficit. She exhibits normal muscle tone. Coordination normal.  Skin: No rash noted. No erythema.  Psychiatric: She has a normal mood and affect. Her behavior is normal. Judgment and thought content normal.    Procedure :Joint Injection, R  shoulder   Indication:  Subacromial bursitis with refractory  chronic pain.   Risks including unsuccessful procedure , bleeding, infection, bruising, skin atrophy and others were explained to the patient in detail as well as the benefits. Informed consent was obtained and signed.   Tthe patient was placed in a comfortable position. Lateral approach was used. Skin was prepped with Betadine and alcohol  and anesthetized with 2 cc of 2% lidocaine and epinephrine, using a 25-gauge 1-1/2 inch needle. Then, a 5 cc syringe with a 2 inch long 24-gauge needle was used for a joint injection.. The needle was advanced  Into the subacromial space.The bursa was injected with 3 mL of 2% lidocaine and 40 mg of Depo-Medrol .  Band-Aid was applied.   Tolerated well. Complications: None. Good pain relief following the procedure.            Assessment & Plan:

## 2012-07-28 NOTE — Telephone Encounter (Signed)
[name], please, inform patient that her X ray did not show a fx. There was some fluid in the joint. Take Ibuprofen (called in) x 1-2 wks; use the ankle support as bfore, elevate.

## 2012-08-12 ENCOUNTER — Ambulatory Visit (INDEPENDENT_AMBULATORY_CARE_PROVIDER_SITE_OTHER): Payer: Medicare Other | Admitting: Gastroenterology

## 2012-08-12 DIAGNOSIS — E538 Deficiency of other specified B group vitamins: Secondary | ICD-10-CM

## 2012-08-12 MED ORDER — CYANOCOBALAMIN 1000 MCG/ML IJ SOLN
1000.0000 ug | Freq: Once | INTRAMUSCULAR | Status: AC
  Administered 2012-08-12: 1000 ug via INTRAMUSCULAR

## 2012-11-28 ENCOUNTER — Other Ambulatory Visit: Payer: Self-pay | Admitting: *Deleted

## 2012-11-28 MED ORDER — DIPHENOXYLATE-ATROPINE 2.5-0.025 MG PO TABS: 1.0000 | ORAL_TABLET | Freq: Two times a day (BID) | ORAL | Status: DC | PRN

## 2012-11-28 MED ORDER — PANTOPRAZOLE SODIUM 40 MG PO TBEC: DELAYED_RELEASE_TABLET | ORAL | Status: DC

## 2012-11-28 NOTE — Telephone Encounter (Signed)
Dr. Jarold Motto filled out request for refill.  Paper was filled out and Rx was faxed to [phone]

## 2012-11-30 ENCOUNTER — Encounter: Payer: Self-pay | Admitting: Internal Medicine

## 2012-11-30 ENCOUNTER — Ambulatory Visit (INDEPENDENT_AMBULATORY_CARE_PROVIDER_SITE_OTHER): Payer: Medicare Other | Admitting: Internal Medicine

## 2012-11-30 VITALS — BP 120/74 | HR 72 | Temp 97.7°F | Ht 64.0 in | Wt 152.2 lb

## 2012-11-30 DIAGNOSIS — IMO0002 Reserved for concepts with insufficient information to code with codable children: Secondary | ICD-10-CM | POA: Insufficient documentation

## 2012-11-30 DIAGNOSIS — L03011 Cellulitis of right finger: Secondary | ICD-10-CM

## 2012-11-30 MED ORDER — MUPIROCIN 2 % EX OINT: TOPICAL_OINTMENT | CUTANEOUS | Status: DC

## 2012-11-30 MED ORDER — DOXYCYCLINE HYCLATE 100 MG PO TABS: 100.0000 mg | ORAL_TABLET | Freq: Two times a day (BID) | ORAL | Status: DC

## 2012-11-30 NOTE — Assessment & Plan Note (Signed)
R index finger 6/14

## 2012-11-30 NOTE — Progress Notes (Signed)
  Subjective:    Patient ID: [name], female    DOB: 1946/01/26, 67 y.o.   MRN: [id number]  HPI  C/o R index finger medial nail edge sore x 1 wk  Review of Systems  Constitutional: Negative for fever.       Objective:   Physical Exam  Constitutional: She appears well-developed. No distress.  Skin: No rash noted. There is erythema.  R index finger medial paronychia  Psychiatric: Judgment normal.          Assessment & Plan:

## 2012-11-30 NOTE — Patient Instructions (Signed)
Soak 4 times a day in HYPERTONIC saline

## 2012-12-01 ENCOUNTER — Telehealth: Payer: Self-pay | Admitting: Gastroenterology

## 2012-12-01 MED ORDER — PANTOPRAZOLE SODIUM 40 MG PO TBEC: DELAYED_RELEASE_TABLET | ORAL | Status: DC

## 2012-12-01 NOTE — Telephone Encounter (Signed)
Sent one refill to patient's pharmacy until scheduled appt with Dr. Jarold Motto and told to keep appt for any further refills.

## 2012-12-02 ENCOUNTER — Other Ambulatory Visit: Payer: Self-pay | Admitting: *Deleted

## 2012-12-02 MED ORDER — PANTOPRAZOLE SODIUM 40 MG PO TBEC: DELAYED_RELEASE_TABLET | ORAL | Status: DC

## 2012-12-02 NOTE — Telephone Encounter (Signed)
[facility] pharmacy for request for patients Protonix for 90 days Sent form via fax Patient has an appointment for 7-28  Sent with no refills

## 2012-12-02 NOTE — Telephone Encounter (Signed)
Please advise refill? 

## 2012-12-04 ENCOUNTER — Encounter: Payer: Self-pay | Admitting: Internal Medicine

## 2012-12-07 MED ORDER — CITALOPRAM HYDROBROMIDE 20 MG PO TABS: 20.0000 mg | ORAL_TABLET | Freq: Every day | ORAL | Status: DC

## 2012-12-20 ENCOUNTER — Telehealth: Payer: Self-pay | Admitting: *Deleted

## 2012-12-20 NOTE — Telephone Encounter (Signed)
Message copied by Florene Glen on Tue Dec 20, 2012  9:05 AM ------      Message from: HUNT, [place] R      Created: Tue Jul 19, 2012 10:03 AM      Regarding: Ferritin       Needs labs in , order in computer ------

## 2012-12-20 NOTE — Telephone Encounter (Signed)
Pt has a f/u with [place] on 01/16/13.

## 2012-12-26 NOTE — Telephone Encounter (Signed)
lmom for pt to call back

## 2012-12-26 NOTE — Telephone Encounter (Signed)
Spoke with pt and she will come in prior to her appt to have her ferritin drawn after Tandem therapy.

## 2013-01-04 ENCOUNTER — Other Ambulatory Visit: Payer: Self-pay | Admitting: Internal Medicine

## 2013-01-05 ENCOUNTER — Other Ambulatory Visit (INDEPENDENT_AMBULATORY_CARE_PROVIDER_SITE_OTHER): Payer: Medicare Other

## 2013-01-05 DIAGNOSIS — D649 Anemia, unspecified: Secondary | ICD-10-CM

## 2013-01-05 LAB — FERRITIN: Ferritin: 17.1 ng/mL (ref 10.0–291.0)

## 2013-01-12 ENCOUNTER — Telehealth: Payer: Self-pay | Admitting: *Deleted

## 2013-01-12 MED ORDER — FERROUS FUM-IRON POLYSACCH 162-115.2 MG PO CAPS: 1.0000 | ORAL_CAPSULE | Freq: Every day | ORAL | Status: DC

## 2013-01-12 NOTE — Telephone Encounter (Signed)
Message copied by Florene Glen on Thu Jan 12, 2013  1:52 PM ------      Message from: Jarold Motto, DAVID R      Created: Thu Jan 12, 2013 10:00 AM       Continue iron therapy and repeat ferritin in 3 months ------

## 2013-01-12 NOTE — Telephone Encounter (Signed)
Informed pt of her ferritin results and to continue Tandem for 3 months and I will remind her to repeat labs. Pt stated understanding and will f/u 01/16/13.

## 2013-01-16 ENCOUNTER — Ambulatory Visit: Payer: Medicare Other | Admitting: Gastroenterology

## 2013-01-24 ENCOUNTER — Encounter: Payer: Self-pay | Admitting: Gastroenterology

## 2013-01-24 ENCOUNTER — Ambulatory Visit (INDEPENDENT_AMBULATORY_CARE_PROVIDER_SITE_OTHER): Payer: Medicare Other | Admitting: Gastroenterology

## 2013-01-24 ENCOUNTER — Other Ambulatory Visit: Payer: Medicare Other

## 2013-01-24 VITALS — BP 100/60 | HR 73 | Ht 64.0 in | Wt 151.8 lb

## 2013-01-24 DIAGNOSIS — K219 Gastro-esophageal reflux disease without esophagitis: Secondary | ICD-10-CM

## 2013-01-24 DIAGNOSIS — D649 Anemia, unspecified: Secondary | ICD-10-CM

## 2013-01-24 DIAGNOSIS — K6389 Other specified diseases of intestine: Secondary | ICD-10-CM

## 2013-01-24 DIAGNOSIS — R197 Diarrhea, unspecified: Secondary | ICD-10-CM

## 2013-01-24 DIAGNOSIS — K529 Noninfective gastroenteritis and colitis, unspecified: Secondary | ICD-10-CM

## 2013-01-24 NOTE — Patient Instructions (Signed)
Your physician has requested that you go to the basement for the following lab work before leaving today: IFOB  Your physician has requested that you go to the basement for the following lab work on March 27, 2013 between the hours of 7:30 am to 5:30 pm: Anemia Panel   We will call you with the results

## 2013-01-24 NOTE — Progress Notes (Signed)
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  She currently is on a regime of Lomotil once a day with use of loperamide one to 2 times a week.  She has developed some lactose intolerance with aging and avoids major lactose foods.  She's had previous extensive negative GI evaluations in [place] and at [facility] in [place].  Previous evaluation for celiac disease was unremarkable.  She does have GERD which is well controlled on Protonix 40 mg a day.  Recently she was found to have some iron deficiency which may be related to recent bladder surgery.  In any case, she denies melena, hematochezia, abdominal pain,  severe diarrhea, anorexia or weight loss.  She does use when necessary Advil, and is currently on tandem iron replacement therapy..  She previously was diagnosed  B12 deficient, but recent levels were above normal limits.  Current Medications, Allergies, Past Medical History, Past Surgical History, Family History and Social History were reviewed in [employer] record.  ROS: All systems were reviewed and are negative unless otherwise stated in the HPI.          Physical Exam: The patient in no distress.  Blood pressure 100/60, pulse 73 and regular and weight 151 with a BMI of 26.04.  I cannot appreciate stigmata of chronic liver disease.  Abdominal exam shows no organomegaly, masses or tenderness.  Bowel sounds are normal.  Mental status is normal    Assessment and Plan: Multifactorial chronic diarrhea doing well at this time.  Her iron deficiency as a surprise, and I will have her do IFOB stool cards.  If these are positive she will need pill camera exam of her small intestine.  She has no symptoms to suggest carcinoid syndrome or chronic malabsorption otherwise.  In 2 months time we  will repeat her iron levels.  Otherwise she is continue all medications as listed and reviewed including probiotic therapy.

## 2013-02-16 ENCOUNTER — Other Ambulatory Visit (INDEPENDENT_AMBULATORY_CARE_PROVIDER_SITE_OTHER): Payer: Medicare Other

## 2013-02-16 DIAGNOSIS — D649 Anemia, unspecified: Secondary | ICD-10-CM

## 2013-03-22 ENCOUNTER — Other Ambulatory Visit: Payer: Self-pay | Admitting: *Deleted

## 2013-03-22 MED ORDER — PANTOPRAZOLE SODIUM 40 MG PO TBEC: DELAYED_RELEASE_TABLET | ORAL | Status: DC

## 2013-03-30 ENCOUNTER — Other Ambulatory Visit: Payer: Self-pay | Admitting: *Deleted

## 2013-03-30 MED ORDER — CITALOPRAM HYDROBROMIDE 20 MG PO TABS: 20.0000 mg | ORAL_TABLET | Freq: Every day | ORAL | Status: DC

## 2013-04-14 ENCOUNTER — Telehealth: Payer: Self-pay | Admitting: *Deleted

## 2013-04-14 NOTE — Telephone Encounter (Signed)
Repeat ferritin in 3 months per note, but pt never did labs ordered on 01/24/13 OV. Mailed pt a letter requesting she come in for labs at her convenience.

## 2013-04-18 ENCOUNTER — Other Ambulatory Visit (INDEPENDENT_AMBULATORY_CARE_PROVIDER_SITE_OTHER): Payer: Medicare Other

## 2013-04-18 DIAGNOSIS — D649 Anemia, unspecified: Secondary | ICD-10-CM

## 2013-04-18 LAB — IBC PANEL
Iron: 143 ug/dL (ref 42–145)
Saturation Ratios: 37 % (ref 20.0–50.0)
Transferrin: 276.1 mg/dL (ref 212.0–360.0)

## 2013-04-25 ENCOUNTER — Telehealth: Payer: Self-pay | Admitting: Gastroenterology

## 2013-04-25 DIAGNOSIS — D649 Anemia, unspecified: Secondary | ICD-10-CM

## 2013-04-25 NOTE — Telephone Encounter (Signed)
Notes Recorded by Mardella Layman, MD on 04/20/2013 at 8:18 AM Do again in 3 mos.Marland KitchenMarland KitchenIFOB  Informed pt of lab results and we will repeat IFOB test in 3 months; pt stated understanding.

## 2013-04-27 ENCOUNTER — Other Ambulatory Visit: Payer: Self-pay

## 2013-05-31 ENCOUNTER — Other Ambulatory Visit: Payer: Self-pay

## 2013-05-31 MED ORDER — CITALOPRAM HYDROBROMIDE 20 MG PO TABS: 20.0000 mg | ORAL_TABLET | Freq: Every day | ORAL | Status: DC

## 2013-06-05 ENCOUNTER — Telehealth: Payer: Self-pay | Admitting: *Deleted

## 2013-06-05 NOTE — Telephone Encounter (Signed)
Message copied by Florene Glen on Mon Jun 05, 2013  8:44 AM ------      Message from: Florene Glen      Created: Tue May 16, 2013  9:08 AM       See if pt came for occult stool ------

## 2013-06-13 NOTE — Telephone Encounter (Signed)
Mailed pt a letter requesting she come for occult stool test.

## 2013-07-12 ENCOUNTER — Ambulatory Visit (INDEPENDENT_AMBULATORY_CARE_PROVIDER_SITE_OTHER): Payer: Medicare HMO | Admitting: Internal Medicine

## 2013-07-12 ENCOUNTER — Encounter: Payer: Self-pay | Admitting: Internal Medicine

## 2013-07-12 VITALS — BP 120/84 | HR 76 | Temp 97.8°F | Resp 16 | Wt 157.0 lb

## 2013-07-12 DIAGNOSIS — N951 Menopausal and female climacteric states: Secondary | ICD-10-CM | POA: Insufficient documentation

## 2013-07-12 DIAGNOSIS — E538 Deficiency of other specified B group vitamins: Secondary | ICD-10-CM

## 2013-07-12 DIAGNOSIS — K589 Irritable bowel syndrome without diarrhea: Secondary | ICD-10-CM

## 2013-07-12 DIAGNOSIS — R1013 Epigastric pain: Secondary | ICD-10-CM

## 2013-07-12 DIAGNOSIS — N3289 Other specified disorders of bladder: Secondary | ICD-10-CM

## 2013-07-12 MED ORDER — CITALOPRAM HYDROBROMIDE 20 MG PO TABS: 20.0000 mg | ORAL_TABLET | Freq: Every day | ORAL | Status: DC

## 2013-07-12 MED ORDER — PANTOPRAZOLE SODIUM 40 MG PO TBEC: DELAYED_RELEASE_TABLET | ORAL | Status: DC

## 2013-07-12 MED ORDER — VITAMIN D 1000 UNITS PO TABS: 1000.0000 [IU] | ORAL_TABLET | Freq: Every day | ORAL | Status: AC

## 2013-07-12 NOTE — Progress Notes (Signed)
  Subjective:    HPI    The patient is here to follow up on chronic Vit B12 deficiency, depression, anxiety, headaches and chronic moderate fibromyalgia symptoms controlled with medicines.  BP Readings from Last 3 Encounters:  07/12/13 120/84  01/24/13 100/60  11/30/12 120/74   Wt Readings from Last 3 Encounters:  07/12/13 157 lb (71.215 kg)  01/24/13 151 lb 12.8 oz (68.856 kg)  11/30/12 152 lb 4 oz (69.06 kg)      Review of Systems  Constitutional: Negative for fever, chills, diaphoresis, activity change, appetite change, fatigue and unexpected weight change.  HENT: Negative for congestion, dental problem, ear pain, hearing loss, mouth sores, postnasal drip, sinus pressure, sneezing, sore throat and voice change.   Eyes: Negative for pain and visual disturbance.  Respiratory: Negative for cough, chest tightness, wheezing and stridor.   Cardiovascular: Negative for chest pain, palpitations and leg swelling.  Gastrointestinal: Negative for nausea, vomiting, abdominal pain, blood in stool, abdominal distention and rectal pain.  Genitourinary: Negative for dysuria, hematuria, decreased urine volume, vaginal bleeding, vaginal discharge, difficulty urinating, vaginal pain and menstrual problem.  Musculoskeletal: Negative for back pain, gait problem, joint swelling and neck pain.  Skin: Negative for color change, rash and wound.  Neurological: Negative for dizziness, tremors, syncope, speech difficulty and light-headedness.  Hematological: Negative for adenopathy.  Psychiatric/Behavioral: Negative for suicidal ideas, hallucinations, behavioral problems, confusion and sleep disturbance. The patient is nervous/anxious. The patient is not hyperactive.        Objective:   Physical Exam  Constitutional: She appears well-developed. No distress.  HENT:  Head: Normocephalic.  Right Ear: External ear normal.  Left Ear: External ear normal.  Nose: Nose normal.  Mouth/Throat: Oropharynx  is clear and moist.  Eyes: Conjunctivae are normal. Pupils are equal, round, and reactive to light. Right eye exhibits no discharge. Left eye exhibits no discharge.  Neck: Normal range of motion. Neck supple. No JVD present. No tracheal deviation present. No thyromegaly present.  Cardiovascular: Normal rate, regular rhythm and normal heart sounds.   Pulmonary/Chest: No stridor. No respiratory distress. She has no wheezes.  Abdominal: Soft. Bowel sounds are normal. She exhibits no distension and no mass. There is no tenderness. There is no rebound and no guarding.  Musculoskeletal: She exhibits no edema and no tenderness.  Lymphadenopathy:    She has no cervical adenopathy.  Neurological: She displays normal reflexes. No cranial nerve deficit. She exhibits normal muscle tone. Coordination normal.  Skin: No rash noted. No erythema.  Psychiatric: She has a normal mood and affect. Her behavior is normal. Judgment and thought content normal.    Lab Results  Component Value Date   WBC 7.1 07/12/2012   HGB 12.5 07/12/2012   HCT 37.9 07/12/2012   PLT 280.0 07/12/2012   GLUCOSE 86 05/20/2010   CHOL 215* 05/20/2010   TRIG 203.0* 05/20/2010   HDL 82.00 05/20/2010   LDLDIRECT 99.1 05/20/2010   ALT 11 05/20/2010   AST 15 05/20/2010   NA 140 05/20/2010   K 4.0 05/20/2010   CL 104 05/20/2010   CREATININE 0.7 05/20/2010   BUN 18 05/20/2010   CO2 28 05/20/2010   TSH 2.53 05/20/2010         Assessment & Plan:

## 2013-07-12 NOTE — Assessment & Plan Note (Signed)
Dr Sharlett Iles

## 2013-07-12 NOTE — Assessment & Plan Note (Signed)
Urol f/up

## 2013-07-12 NOTE — Assessment & Plan Note (Signed)
GYN for PAP

## 2013-07-12 NOTE — Assessment & Plan Note (Signed)
Gluten free trial (no wheat products) for 4-6 weeks. OK to use gluten-free bread and gluten-free pasta.  Milk free trial (no milk, ice cream, cheese and yogurt) for 4-6 weeks. OK to use almond, coconut, rice or soy milk. "Almond breeze" brand tastes good.  

## 2013-07-12 NOTE — Patient Instructions (Signed)
Gluten free trial (no wheat products) for 4-6 weeks. OK to use gluten-free bread and gluten-free pasta.  Milk free trial (no milk, ice cream, cheese and yogurt) for 4-6 weeks. OK to use almond, coconut, rice or soy milk. "Almond breeze" brand tastes good.  

## 2013-07-12 NOTE — Progress Notes (Signed)
Pre visit review using our clinic review tool, if applicable. No additional management support is needed unless otherwise documented below in the visit note. 

## 2013-07-13 NOTE — Assessment & Plan Note (Signed)
Continue with current prescription therapy as reflected on the Med list.  

## 2013-07-20 ENCOUNTER — Encounter: Payer: Self-pay | Admitting: *Deleted

## 2013-07-21 ENCOUNTER — Telehealth: Payer: Self-pay | Admitting: *Deleted

## 2013-07-21 NOTE — Telephone Encounter (Signed)
Dr Sharlett Iles, I have written 2 letters to pt over a period of 2.5 months and she has not come for IFOB testing. Can we forget about it? Thanks.

## 2013-07-21 NOTE — Telephone Encounter (Signed)
Message copied by Lance Morin on Fri Jul 21, 2013  3:36 PM ------      Message from: Lance Morin      Created: Tue Apr 25, 2013  4:00 PM       Notes Recorded by Sable Feil, MD on 04/20/2013 at 8:18 AM      Do again in 3 mos.Marland KitchenMarland KitchenIFOB. Test in       ------

## 2013-07-24 NOTE — Telephone Encounter (Signed)
Pt never called back.

## 2013-07-24 NOTE — Telephone Encounter (Signed)
yes

## 2013-07-28 ENCOUNTER — Encounter: Payer: Self-pay | Admitting: Gastroenterology

## 2013-07-28 ENCOUNTER — Ambulatory Visit (INDEPENDENT_AMBULATORY_CARE_PROVIDER_SITE_OTHER): Payer: Medicare HMO | Admitting: Gastroenterology

## 2013-07-28 VITALS — BP 124/80 | HR 74 | Ht 64.0 in | Wt 157.0 lb

## 2013-07-28 DIAGNOSIS — K219 Gastro-esophageal reflux disease without esophagitis: Secondary | ICD-10-CM

## 2013-07-28 DIAGNOSIS — R197 Diarrhea, unspecified: Secondary | ICD-10-CM

## 2013-07-28 DIAGNOSIS — Z800 Family history of malignant neoplasm of digestive organs: Secondary | ICD-10-CM

## 2013-07-28 DIAGNOSIS — Z8719 Personal history of other diseases of the digestive system: Secondary | ICD-10-CM

## 2013-07-28 NOTE — Patient Instructions (Addendum)
Please follow up in one year with Dr. Jenkins Rouge will get a call from [facility] regarding your Cologuard stool test   Information on acid reflux is below for your review  We have sent the following medications to your pharmacy for you to pick up at your convenience: Dexilant 60 mg ___________________________________________________________  Diet for Gastroesophageal Reflux Disease, Adult Reflux (acid reflux) is when acid from your stomach flows up into the esophagus. When acid comes in contact with the esophagus, the acid causes irritation and soreness (inflammation) in the esophagus. When reflux happens often or so severely that it causes damage to the esophagus, it is called gastroesophageal reflux disease (GERD). Nutrition therapy can help ease the discomfort of GERD. FOODS OR DRINKS TO AVOID OR LIMIT  Smoking or chewing tobacco. Nicotine is one of the most potent stimulants to acid production in the gastrointestinal tract.  Caffeinated and decaffeinated coffee and black tea.  Regular or low-calorie carbonated beverages or energy drinks (caffeine-free carbonated beverages are allowed).   Strong spices, such as black pepper, white pepper, red pepper, cayenne, curry powder, and chili powder.  Peppermint or spearmint.  Chocolate.  High-fat foods, including meats and fried foods. Extra added fats including oils, butter, salad dressings, and nuts. Limit these to less than 8 tsp per day.  Fruits and vegetables if they are not tolerated, such as citrus fruits or tomatoes.  Alcohol.  Any food that seems to aggravate your condition. If you have questions regarding your diet, call your caregiver or a registered dietitian. OTHER THINGS THAT MAY HELP GERD INCLUDE:   Eating your meals slowly, in a relaxed setting.  Eating 5 to 6 small meals per day instead of 3 large meals.  Eliminating food for a period of time if it causes distress.  Not lying down until 3 hours  after eating a meal.  Keeping the head of your bed raised 6 to 9 inches (15 to 23 cm) by using a foam wedge or blocks under the legs of the bed. Lying flat may make symptoms worse.  Being physically active. Weight loss may be helpful in reducing reflux in overweight or obese adults.  Wear loose fitting clothing EXAMPLE MEAL PLAN This meal plan is approximately 2,000 calories based on [email or web address] meal planning guidelines. Breakfast   cup cooked oatmeal.  1 cup strawberries.  1 cup low-fat milk.  1 oz almonds. Snack  1 cup cucumber slices.  6 oz yogurt (made from low-fat or fat-free milk). Lunch  2 slice whole-wheat bread.  2 oz sliced [place].  2 tsp mayonnaise.  1 cup blueberries.  1 cup snap peas. Snack  6 whole-wheat crackers.  1 oz string cheese. Dinner   cup brown rice.  1 cup mixed veggies.  1 tsp olive oil.  3 oz grilled fish. Document Released: 06/08/2005 Document Revised: 08/31/2011 Document Reviewed: 04/24/2011 [facility] Patient Information 2014 [place], [place].

## 2013-07-28 NOTE — Progress Notes (Signed)
This is a 68 year old Caucasian female with rapid intestinal transit and chronic irritable bowel syndrome has had extensive previous GI evaluations including colonoscopy in February of 2011 which was unremarkable including random colon biopsies.  Her diarrhea is controlled with Lomotil one to 2 tablets a day.  She denies lower GI or diarrhea problems today.  She complains of some breakthrough acid reflux before bed with regurgitation and burning substernal chest pain, but she has no associated dysphasia or hepatobiliary complaints.  She uses large amount of TUMS and Pepto-Bismol before bedtime.  She has a family history of colon cancer in her father.  She previously has some iron deficiency which is resolved, and Hemoccult cards within the last year been guaiac negative.  She denies melena, hematochezia, or any symptoms of anemia.  In addition to omeprazole she is on daily Celexa.  She denies a specific food intolerances, anorexia, or weight loss.  Current Medications, Allergies, Past Medical History, Past Surgical History, Family History and Social History were reviewed in [employer] record.  ROS: All systems were reviewed and are negative unless otherwise stated in the HPI.   Allergies  Allergen Reactions  . Bupropion Hcl     REACTION: head buzzing  . Fluoxetine Hcl     REACTION: anxious  . Latex   . Propoxyphene N-Acetaminophen   . Venlafaxine     REACTION: anxious   Outpatient Prescriptions Prior to Visit  Medication Sig Dispense Refill  . cholecalciferol (VITAMIN D) 1000 UNITS tablet Take 1 tablet (1,000 Units total) by mouth daily.  100 tablet  3  . citalopram (CELEXA) 20 MG tablet Take 1 tablet (20 mg total) by mouth daily.  30 tablet  3  . pantoprazole (PROTONIX) 40 MG tablet Take one tablet by mouth once a day  90 tablet  3  . diphenoxylate-atropine (LOMOTIL) 2.5-0.025 MG per tablet Take 1 tablet by mouth 2 (two) times daily.      Marland Kitchen loratadine (CLARITIN) 10 MG  tablet Take 1 tablet (10 mg total) by mouth daily.  30 tablet  11  . zolmitriptan (ZOMIG) 5 MG nasal solution Place 1 spray into the nose as needed. Rf phoned in for same quantity pt received last time which was a 60 day supply # 24 units.       No facility-administered medications prior to visit.   Past Medical History  Diagnosis Date  . Hiatal hernia   . Esophageal reflux   . Diverticulosis of colon (without mention of hemorrhage)   . Irritable bowel syndrome   . Anal fissure   . Incontinence of feces   . Degeneration of intervertebral disc, site unspecified   . Depressive disorder, not elsewhere classified   . Other and unspecified ovarian cyst   . Anxiety   . Depression   . Blind loop syndrome   . Other B-complex deficiencies   . History of colon polyps 08/24/2011    hyperplastic  . Family history of malignant neoplasm of gastrointestinal tract   . Anemia, unspecified    Past Surgical History  Procedure Laterality Date  . Cholecystectomy  2003  . Abdominal hysterectomy  1972  . Cesarean section  1971  . Bladder suspension  [phone]  . Breast enhancement surgery  1980  . Abdominoplasty  1978   History   Social History  . Marital Status: Divorced    Spouse Name: N/A    Number of Children: N/A  . Years of Education: N/A   Social History  Main Topics  . Smoking status: Never Smoker   . Smokeless tobacco: None  . Alcohol Use: Yes  . Drug Use: No  . Sexual Activity: None   Other Topics Concern  . None   Social History Narrative  . None   Family History  Problem Relation Age of Onset  . Hypertension    . Coronary artery disease    . Colon cancer    . Lung cancer    . Diabetes              Physical Exam: Healthy-appearing patient in no acute distress appearing younger than her stated age.  Blood pressure 124/80, pulse 74 and regular and weight 157 with a BMI of 26.94.  Cannot appreciate stigmata of chronic liver disease.  Chest is clear she appear  to be in a regular rhythm without murmurs gallops or rubs.  Her abdomen shows no organomegaly, masses or tenderness.  Bowel sounds are normal.  Mental status is normal.  Peripheral extremities are unremarkable.    Assessment and Plan: This patient's GERD is not controlled in the evening hours or at bedtime.  I've reviewed antireflux regime with her and we'll switch to Dexilant 60 mg a day for longer acid suppression control.  Because of her family history of colon cancer I have ordered Cologuard stool testing for DNA abnormalities.  CBC and iron studies have been normal in the last several months.  I see no need repeat any labs today.  She is followed closely by primary care also.  Previous endoscopy and colonoscopy in February of 2011.  She will need followup colonoscopy in February of 2016 because of her family history.  Her endoscopy did show a hiatal hernia biopsies were negative for H. pylori and any evidence of eosinophilic esophagitis.  Her chronic diarrhea which has been extensively evaluated seems to be doing well with regular daily Lomotil usage I've asked her to continue as previously planned per my multiple notes concern in her diarrhea.  Upon my retirement I will turn her care to Dr.Pyrtle.

## 2013-08-04 ENCOUNTER — Telehealth: Payer: Self-pay | Admitting: Gastroenterology

## 2013-08-04 NOTE — Telephone Encounter (Signed)
Spoke with patient and patient called her insurance company to [facility] on [address]. Patient stated Dexilant will be to expensive and wants to know if she can try taking her Pantoprazole twice daily. I advised patient she can take Pantoprazole thirty minutes before breakfast and thirty minutes before dinner. Patient verbalized understanding and stated that she has lots of Pantoprazole left, so does not need new prescription. I advised patient to try taking Pantoprazole twice daily for two weeks and then call to let me know how she is doing. Patient verbalized understanding.

## 2013-08-22 LAB — COLOGUARD: Cologuard: NEGATIVE

## 2013-08-23 ENCOUNTER — Telehealth: Payer: Self-pay | Admitting: Internal Medicine

## 2013-08-24 NOTE — Telephone Encounter (Signed)
Ok to refill 

## 2013-08-25 ENCOUNTER — Other Ambulatory Visit: Payer: Self-pay | Admitting: Gastroenterology

## 2013-08-25 MED ORDER — DEXLANSOPRAZOLE 60 MG PO CPDR: 60.0000 mg | DELAYED_RELEASE_CAPSULE | Freq: Every day | ORAL | Status: DC

## 2013-08-25 NOTE — Telephone Encounter (Signed)
Patient need's refill on [facility] and a prescription for Dexilant.(Patient has samples of Dexilant that Dr. Sharlett Iles gave her and she said it works well)  Patient was seen 07-2013 and told to follow up with you. Is it ok to send the prescription's in?

## 2013-08-25 NOTE — Telephone Encounter (Signed)
Okay to refill until office follow-up

## 2013-09-14 ENCOUNTER — Telehealth: Payer: Self-pay | Admitting: Gastroenterology

## 2013-09-14 NOTE — Telephone Encounter (Signed)
Spoke to pt. Gave her Cologuard results which were negative. Pt verbalized understanding. Sent results to be scanned

## 2013-09-19 ENCOUNTER — Telehealth: Payer: Self-pay | Admitting: Internal Medicine

## 2013-09-19 ENCOUNTER — Other Ambulatory Visit: Payer: Self-pay | Admitting: Gastroenterology

## 2013-09-19 MED ORDER — DIPHENOXYLATE-ATROPINE 2.5-0.025 MG PO TABS: 1.0000 | ORAL_TABLET | Freq: Two times a day (BID) | ORAL | Status: DC

## 2013-09-19 NOTE — Telephone Encounter (Signed)
Sent lomotil to [facility]

## 2013-09-20 LAB — HM MAMMOGRAPHY

## 2013-09-26 ENCOUNTER — Encounter: Payer: Self-pay | Admitting: Gastroenterology

## 2014-01-11 ENCOUNTER — Ambulatory Visit (INDEPENDENT_AMBULATORY_CARE_PROVIDER_SITE_OTHER): Payer: Medicare HMO | Admitting: Internal Medicine

## 2014-01-11 ENCOUNTER — Encounter: Payer: Self-pay | Admitting: Internal Medicine

## 2014-01-11 VITALS — BP 110/68 | HR 66 | Temp 97.7°F | Resp 16 | Ht 64.0 in | Wt 157.0 lb

## 2014-01-11 DIAGNOSIS — F3289 Other specified depressive episodes: Secondary | ICD-10-CM

## 2014-01-11 DIAGNOSIS — K589 Irritable bowel syndrome without diarrhea: Secondary | ICD-10-CM

## 2014-01-11 DIAGNOSIS — E538 Deficiency of other specified B group vitamins: Secondary | ICD-10-CM

## 2014-01-11 DIAGNOSIS — F329 Major depressive disorder, single episode, unspecified: Secondary | ICD-10-CM

## 2014-01-11 MED ORDER — DIPHENOXYLATE-ATROPINE 2.5-0.025 MG PO TABS: 1.0000 | ORAL_TABLET | Freq: Two times a day (BID) | ORAL | Status: DC

## 2014-01-11 NOTE — Progress Notes (Signed)
Pre visit review using our clinic review tool, if applicable. No additional management support is needed unless otherwise documented below in the visit note. 

## 2014-01-11 NOTE — Assessment & Plan Note (Signed)
Better on Lactose free milk

## 2014-01-11 NOTE — Assessment & Plan Note (Signed)
Continue with current prescription therapy as reflected on the Med list.  

## 2014-01-11 NOTE — Progress Notes (Signed)
   Subjective:    HPI    The patient is here to follow up on chronic Vit B12 deficiency, depression, anxiety, headaches and chronic moderate fibromyalgia symptoms controlled with medicines. Lactose free milk helped bloating  BP Readings from Last 3 Encounters:  01/11/14 110/68  07/28/13 124/80  07/12/13 120/84   Wt Readings from Last 3 Encounters:  01/11/14 157 lb (71.215 kg)  07/28/13 157 lb (71.215 kg)  07/12/13 157 lb (71.215 kg)      Review of Systems  Constitutional: Negative for fever, chills, diaphoresis, activity change, appetite change, fatigue and unexpected weight change.  HENT: Negative for congestion, dental problem, ear pain, hearing loss, mouth sores, postnasal drip, sinus pressure, sneezing, sore throat and voice change.   Eyes: Negative for pain and visual disturbance.  Respiratory: Negative for cough, chest tightness, wheezing and stridor.   Cardiovascular: Negative for chest pain, palpitations and leg swelling.  Gastrointestinal: Negative for nausea, vomiting, abdominal pain, blood in stool, abdominal distention and rectal pain.  Genitourinary: Negative for dysuria, hematuria, decreased urine volume, vaginal bleeding, vaginal discharge, difficulty urinating, vaginal pain and menstrual problem.  Musculoskeletal: Negative for back pain, gait problem, joint swelling and neck pain.  Skin: Negative for color change, rash and wound.  Neurological: Negative for dizziness, tremors, syncope, speech difficulty and light-headedness.  Hematological: Negative for adenopathy.  Psychiatric/Behavioral: Negative for suicidal ideas, hallucinations, behavioral problems, confusion and sleep disturbance. The patient is nervous/anxious. The patient is not hyperactive.        Objective:   Physical Exam  Constitutional: She appears well-developed. No distress.  HENT:  Head: Normocephalic.  Right Ear: External ear normal.  Left Ear: External ear normal.  Nose: Nose normal.   Mouth/Throat: Oropharynx is clear and moist.  Eyes: Conjunctivae are normal. Pupils are equal, round, and reactive to light. Right eye exhibits no discharge. Left eye exhibits no discharge.  Neck: Normal range of motion. Neck supple. No JVD present. No tracheal deviation present. No thyromegaly present.  Cardiovascular: Normal rate, regular rhythm and normal heart sounds.   Pulmonary/Chest: No stridor. No respiratory distress. She has no wheezes.  Abdominal: Soft. Bowel sounds are normal. She exhibits no distension and no mass. There is no tenderness. There is no rebound and no guarding.  Musculoskeletal: She exhibits no edema and no tenderness.  Lymphadenopathy:    She has no cervical adenopathy.  Neurological: She displays normal reflexes. No cranial nerve deficit. She exhibits normal muscle tone. Coordination normal.  Skin: No rash noted. No erythema.  Psychiatric: She has a normal mood and affect. Her behavior is normal. Judgment and thought content normal.    Lab Results  Component Value Date   WBC 7.1 07/12/2012   HGB 12.5 07/12/2012   HCT 37.9 07/12/2012   PLT 280.0 07/12/2012   GLUCOSE 86 05/20/2010   CHOL 215* 05/20/2010   TRIG 203.0* 05/20/2010   HDL 82.00 05/20/2010   LDLDIRECT 99.1 05/20/2010   ALT 11 05/20/2010   AST 15 05/20/2010   NA 140 05/20/2010   K 4.0 05/20/2010   CL 104 05/20/2010   CREATININE 0.7 05/20/2010   BUN 18 05/20/2010   CO2 28 05/20/2010   TSH 2.53 05/20/2010         Assessment & Plan:

## 2014-01-11 NOTE — Patient Instructions (Signed)
  Milk free trial (no milk, ice cream and yogurt) for 4-6 weeks. OK to use almond, coconut, rice or soy milk. "Almond breeze" brand tastes good.

## 2014-01-19 ENCOUNTER — Other Ambulatory Visit: Payer: Self-pay

## 2014-01-19 MED ORDER — DIPHENOXYLATE-ATROPINE 2.5-0.025 MG PO TABS: 1.0000 | ORAL_TABLET | Freq: Two times a day (BID) | ORAL | Status: DC

## 2014-01-19 NOTE — Telephone Encounter (Signed)
Refill ok per Barb Merino, RN, [place].  Printed rx faxed to [facility] at [phone].

## 2014-04-26 ENCOUNTER — Ambulatory Visit: Payer: Medicare HMO | Admitting: Gastroenterology

## 2014-04-27 ENCOUNTER — Encounter: Payer: Self-pay | Admitting: Nurse Practitioner

## 2014-04-27 ENCOUNTER — Telehealth: Payer: Self-pay | Admitting: Nurse Practitioner

## 2014-04-27 ENCOUNTER — Ambulatory Visit (INDEPENDENT_AMBULATORY_CARE_PROVIDER_SITE_OTHER): Payer: Commercial Managed Care - HMO | Admitting: Nurse Practitioner

## 2014-04-27 DIAGNOSIS — K648 Other hemorrhoids: Secondary | ICD-10-CM

## 2014-04-27 MED ORDER — HYDROCORTISONE ACETATE 25 MG RE SUPP: 25.0000 mg | Freq: Every day | RECTAL | Status: DC

## 2014-04-27 NOTE — Telephone Encounter (Signed)
rx sent

## 2014-04-27 NOTE — Patient Instructions (Addendum)
Please purchase Balneol over the counter, use as directed  Information on Balneol was given today for you to review  We have sent the following medications to your pharmacy for you to pick up at your convenience: Hydrocortisone Suppositories, place one suppository per rectum at bedtime for seven days   Please purchase Monistat over the counter and use as directed  Please call back with an update on how you are feeling in two weeks You can speak to Chanda Busing nurse

## 2014-04-29 ENCOUNTER — Encounter: Payer: Self-pay | Admitting: Nurse Practitioner

## 2014-04-29 DIAGNOSIS — K648 Other hemorrhoids: Secondary | ICD-10-CM | POA: Insufficient documentation

## 2014-04-29 NOTE — Progress Notes (Signed)
     History of Present Illness:   Patient is a 68 year old female, previously known to Dr. Sharlett Iles for a history of chronic irritable bowel syndrome  She has a family history of colon cancer in her father.Patient's last colonoscopy was Feb 2011 with findings of severe sigmoid diverticulsos, o/w normal. She had a sigmoidoscopy March 2013 for evaluation of diarrhea. Findings included diverticulosis. Random biopsies negative.   Patient here today for evaluation of hemorrhoids. She complains of significant perianal itching and discomfort. Patient took antibiotics recently, now thinks she has a vaginal yeast infection.   Current Medications, Allergies, Past Medical History, Past Surgical History, Family History and Social History were reviewed in [employer] record.  Physical Exam: General: Pleasant, well developed , white female in no acute distress Head: Normocephalic and atraumatic Eyes:  sclerae anicteric, conjunctiva pink  Ears: Normal auditory acuity Lungs: Clear throughout to auscultation Heart: Regular rate and rhythm Abdomen: Soft, non distended, non-tender. No masses, no hepatomegaly. Normal bowel sounds Rectal: no fissure seen. No external hemorrhoids. A few internal hemorrhoids seen on anoscopy Musculoskeletal: Symmetrical with no gross deformities  Extremities: No edema  Neurological: Alert oriented x 4, grossly nonfocal Psychological:  Alert and cooperative. Normal mood and affect  Assessment and Recommendations:  20. 68 year old female with internal hemorrhoids /perianal itching. Trial of hydrocortisone suppositories for hemorrhoids. Trial of Balneol for perianal cleansing, will also help with itching.   2. Possible vaginal yeast infection from antibiotics. Recommended Monistat cream

## 2014-04-30 MED ORDER — HYDROCORTISONE 2.5 % RE CREA: 1.0000 "application " | TOPICAL_CREAM | Freq: Two times a day (BID) | RECTAL | Status: DC

## 2014-04-30 NOTE — Telephone Encounter (Signed)
Sent Anusol Cream with Applicator to be able to place some cream inside

## 2014-04-30 NOTE — Progress Notes (Signed)
Agree with initial assessment and plans 

## 2014-05-02 ENCOUNTER — Telehealth: Payer: Self-pay | Admitting: Nurse Practitioner

## 2014-05-02 NOTE — Telephone Encounter (Signed)
Patient notified that anusol cream was sent for her to try

## 2014-05-22 ENCOUNTER — Other Ambulatory Visit: Payer: Self-pay | Admitting: Internal Medicine

## 2014-07-18 ENCOUNTER — Encounter: Payer: Medicare HMO | Admitting: Internal Medicine

## 2014-07-24 ENCOUNTER — Encounter: Payer: Medicare HMO | Admitting: Internal Medicine

## 2014-07-27 ENCOUNTER — Telehealth: Payer: Self-pay | Admitting: Internal Medicine

## 2014-07-27 NOTE — Telephone Encounter (Signed)
Patient no showed for cpe on 2/2.  Please advise.

## 2014-08-20 ENCOUNTER — Telehealth: Payer: Self-pay | Admitting: *Deleted

## 2014-08-20 MED ORDER — CIPROFLOXACIN HCL 250 MG PO TABS: 250.0000 mg | ORAL_TABLET | Freq: Two times a day (BID) | ORAL | Status: DC

## 2014-08-20 NOTE — Telephone Encounter (Signed)
Left msg on triage stating she have a UTI needing antibiotic call into her pharmacy. Called pt back inform her she will need to be seen B4 md could rx something. Pt states she has an appt already schedule for 08/29/14 for her cox. She is not able to come in any sooner...Johny Chess

## 2014-08-20 NOTE — Telephone Encounter (Signed)
Ok Cipro x 3 d Thx

## 2014-08-20 NOTE — Telephone Encounter (Signed)
Called pt no answer LMOM md sent cipro to [facility].../lmb

## 2014-08-27 ENCOUNTER — Encounter: Payer: Self-pay | Admitting: Internal Medicine

## 2014-08-29 ENCOUNTER — Other Ambulatory Visit (INDEPENDENT_AMBULATORY_CARE_PROVIDER_SITE_OTHER): Payer: Commercial Managed Care - HMO

## 2014-08-29 ENCOUNTER — Ambulatory Visit (INDEPENDENT_AMBULATORY_CARE_PROVIDER_SITE_OTHER): Payer: Commercial Managed Care - HMO | Admitting: Internal Medicine

## 2014-08-29 ENCOUNTER — Encounter: Payer: Self-pay | Admitting: Internal Medicine

## 2014-08-29 VITALS — HR 72 | Temp 97.3°F | Ht 64.0 in | Wt 159.0 lb

## 2014-08-29 DIAGNOSIS — Z0000 Encounter for general adult medical examination without abnormal findings: Secondary | ICD-10-CM

## 2014-08-29 DIAGNOSIS — G4700 Insomnia, unspecified: Secondary | ICD-10-CM | POA: Diagnosis not present

## 2014-08-29 DIAGNOSIS — R300 Dysuria: Secondary | ICD-10-CM

## 2014-08-29 DIAGNOSIS — H9193 Unspecified hearing loss, bilateral: Secondary | ICD-10-CM | POA: Diagnosis not present

## 2014-08-29 DIAGNOSIS — E538 Deficiency of other specified B group vitamins: Secondary | ICD-10-CM

## 2014-08-29 DIAGNOSIS — Z23 Encounter for immunization: Secondary | ICD-10-CM | POA: Diagnosis not present

## 2014-08-29 LAB — BASIC METABOLIC PANEL
BUN: 20 mg/dL (ref 6–23)
CHLORIDE: 105 meq/L (ref 96–112)
CO2: 28 mEq/L (ref 19–32)
Calcium: 9.4 mg/dL (ref 8.4–10.5)
Creatinine, Ser: 0.79 mg/dL (ref 0.40–1.20)
GFR: 76.31 mL/min (ref >60.00)
GLUCOSE: 90 mg/dL (ref 70–99)
POTASSIUM: 4.6 meq/L (ref 3.5–5.1)
SODIUM: 137 meq/L (ref 135–145)

## 2014-08-29 LAB — LIPID PANEL
CHOL/HDL RATIO: 3
Cholesterol: 196 mg/dL (ref 0–200)
HDL: 60.7 mg/dL (ref >39.00)
NONHDL: 135.3
Triglycerides: 263 mg/dL — ABNORMAL HIGH (ref 0.0–149.0)
VLDL: 52.6 mg/dL — ABNORMAL HIGH (ref 0.0–40.0)

## 2014-08-29 LAB — LDL CHOLESTEROL, DIRECT: Direct LDL: 109 mg/dL

## 2014-08-29 LAB — URINALYSIS
Hgb urine dipstick: NEGATIVE
Ketones, ur: NEGATIVE
LEUKOCYTES UA: NEGATIVE
Nitrite: NEGATIVE
PH: 6 (ref 5.0–8.0)
Total Protein, Urine: NEGATIVE
URINE GLUCOSE: NEGATIVE
Urobilinogen, UA: 0.2 (ref 0.0–1.0)

## 2014-08-29 LAB — HEPATIC FUNCTION PANEL
ALBUMIN: 4.2 g/dL (ref 3.5–5.2)
ALT: 12 U/L (ref 0–35)
AST: 14 U/L (ref 0–37)
Alkaline Phosphatase: 68 U/L (ref 39–117)
Bilirubin, Direct: 0.1 mg/dL (ref 0.0–0.3)
Total Bilirubin: 0.4 mg/dL (ref 0.2–1.2)
Total Protein: 6.9 g/dL (ref 6.0–8.3)

## 2014-08-29 LAB — TSH: TSH: 2.41 u[IU]/mL (ref 0.35–4.50)

## 2014-08-29 LAB — CBC WITH DIFFERENTIAL/PLATELET
Basophils Absolute: 0 10*3/uL (ref 0.0–0.1)
Basophils Relative: 0.5 % (ref 0.0–3.0)
EOS ABS: 0.2 10*3/uL (ref 0.0–0.7)
Eosinophils Relative: 2 % (ref 0.0–5.0)
HEMATOCRIT: 38.7 % (ref 36.0–46.0)
Hemoglobin: 13.3 g/dL (ref 12.0–15.0)
Lymphocytes Relative: 41.7 % (ref 12.0–46.0)
Lymphs Abs: 3.2 10*3/uL (ref 0.7–4.0)
MCHC: 34.3 g/dL (ref 30.0–36.0)
MCV: 88.9 fl (ref 78.0–100.0)
MONO ABS: 0.4 10*3/uL (ref 0.1–1.0)
Monocytes Relative: 4.8 % (ref 3.0–12.0)
Neutro Abs: 3.9 10*3/uL (ref 1.4–7.7)
Neutrophils Relative %: 51 % (ref 43.0–77.0)
Platelets: 271 10*3/uL (ref 150.0–400.0)
RBC: 4.36 Mil/uL (ref 3.87–5.11)
RDW: 13.4 % (ref 11.5–15.5)
WBC: 7.7 10*3/uL (ref 4.0–10.5)

## 2014-08-29 LAB — VITAMIN B12: VITAMIN B 12: 1017 pg/mL — AB (ref 211–911)

## 2014-08-29 MED ORDER — SUVOREXANT 15 MG PO TABS: 15.0000 mg | ORAL_TABLET | Freq: Every evening | ORAL | Status: DC | PRN

## 2014-08-29 MED ORDER — ESTROGENS, CONJUGATED 0.625 MG/GM VA CREA: TOPICAL_CREAM | VAGINAL | Status: DC

## 2014-08-29 MED ORDER — PANTOPRAZOLE SODIUM 40 MG PO TBEC: 40.0000 mg | DELAYED_RELEASE_TABLET | Freq: Every day | ORAL | Status: DC

## 2014-08-29 MED ORDER — CITALOPRAM HYDROBROMIDE 20 MG PO TABS: 20.0000 mg | ORAL_TABLET | Freq: Every day | ORAL | Status: DC

## 2014-08-29 MED ORDER — DIPHENOXYLATE-ATROPINE 2.5-0.025 MG PO TABS: 1.0000 | ORAL_TABLET | Freq: Two times a day (BID) | ORAL | Status: DC

## 2014-08-29 NOTE — Assessment & Plan Note (Signed)
Melatonin x 5 d Rx: Belsomra

## 2014-08-29 NOTE — Assessment & Plan Note (Signed)
On PO Vit B12 Labs

## 2014-08-29 NOTE — Progress Notes (Signed)
Pre visit review using our clinic review tool, if applicable. No additional management support is needed unless otherwise documented below in the visit note. 

## 2014-08-29 NOTE — Assessment & Plan Note (Signed)
UA, Cx Took Cipro x3d

## 2014-08-29 NOTE — Progress Notes (Signed)
   Subjective:    HPI  The patient is here for a wellness exam.   C/o hearing issues  C/o urinary burning, Cipro did not help x 3-4 weeks  The patient is here to follow up on chronic Vit B12 deficiency, depression, anxiety, headaches and chronic moderate fibromyalgia symptoms controlled with medicines.  Lactose free milk helped bloating  BP Readings from Last 3 Encounters:  04/27/14 114/66  01/11/14 110/68  07/28/13 124/80   Wt Readings from Last 3 Encounters:  08/29/14 159 lb (72.122 kg)  04/27/14 156 lb 6.4 oz (70.943 kg)  01/11/14 157 lb (71.215 kg)      Review of Systems  Constitutional: Negative for fever, chills, diaphoresis, activity change, appetite change, fatigue and unexpected weight change.  HENT: Negative for congestion, dental problem, ear pain, hearing loss, mouth sores, postnasal drip, sinus pressure, sneezing, sore throat and voice change.   Eyes: Negative for pain and visual disturbance.  Respiratory: Negative for cough, chest tightness, wheezing and stridor.   Cardiovascular: Negative for chest pain, palpitations and leg swelling.  Gastrointestinal: Negative for nausea, vomiting, abdominal pain, blood in stool, abdominal distention and rectal pain.  Genitourinary: Negative for dysuria, hematuria, decreased urine volume, vaginal bleeding, vaginal discharge, difficulty urinating, vaginal pain and menstrual problem.  Musculoskeletal: Negative for back pain, joint swelling, gait problem and neck pain.  Skin: Negative for color change, rash and wound.  Neurological: Negative for dizziness, tremors, syncope, speech difficulty and light-headedness.  Hematological: Negative for adenopathy.  Psychiatric/Behavioral: Negative for suicidal ideas, hallucinations, behavioral problems, confusion and sleep disturbance. The patient is nervous/anxious. The patient is not hyperactive.        Objective:   Physical Exam  Constitutional: She appears well-developed. No  distress.  HENT:  Head: Normocephalic.  Right Ear: External ear normal.  Left Ear: External ear normal.  Nose: Nose normal.  Mouth/Throat: Oropharynx is clear and moist.  Eyes: Conjunctivae are normal. Pupils are equal, round, and reactive to light. Right eye exhibits no discharge. Left eye exhibits no discharge.  Neck: Normal range of motion. Neck supple. No JVD present. No tracheal deviation present. No thyromegaly present.  Cardiovascular: Normal rate, regular rhythm and normal heart sounds.   Pulmonary/Chest: No stridor. No respiratory distress. She has no wheezes.  Abdominal: Soft. Bowel sounds are normal. She exhibits no distension and no mass. There is no tenderness. There is no rebound and no guarding.  Musculoskeletal: She exhibits no edema or tenderness.  Lymphadenopathy:    She has no cervical adenopathy.  Neurological: She displays normal reflexes. No cranial nerve deficit. She exhibits normal muscle tone. Coordination normal.  Skin: No rash noted. No erythema.  Psychiatric: She has a normal mood and affect. Her behavior is normal. Judgment and thought content normal.    Lab Results  Component Value Date   WBC 7.1 07/12/2012   HGB 12.5 07/12/2012   HCT 37.9 07/12/2012   PLT 280.0 07/12/2012   GLUCOSE 86 05/20/2010   CHOL 215* 05/20/2010   TRIG 203.0* 05/20/2010   HDL 82.00 05/20/2010   LDLDIRECT 99.1 05/20/2010   ALT 11 05/20/2010   AST 15 05/20/2010   NA 140 05/20/2010   K 4.0 05/20/2010   CL 104 05/20/2010   CREATININE 0.7 05/20/2010   BUN 18 05/20/2010   CO2 28 05/20/2010   TSH 2.53 05/20/2010    Cologuard (-) 2015     Assessment & Plan:

## 2014-08-29 NOTE — Assessment & Plan Note (Signed)
Here for medicare wellness/physical  Diet: heart healthy  Physical activity: not sedentary  Depression/mood screen: negative  Hearing: decreased to whispered voice  Visual acuity: grossly normal, performs annual eye exam  ADLs: capable  Fall risk: none  Home safety: good  Cognitive evaluation: intact to orientation, naming, recall and repetition  EOL planning: adv directives, full code/ I agree  I have personally reviewed and have noted  1. The patient's medical, surgical and social history  2. Their use of alcohol, tobacco or illicit drugs  3. Their current medications and supplements  4. The patient's functional ability including ADL's, fall risks, home safety risks and hearing or visual impairment.  5. Diet and physical activities  6. Evidence for depression or mood disorders  Cologuard (-) 2015    Today patient counseled on age appropriate routine health concerns for screening and prevention, each reviewed and up to date or declined. Immunizations reviewed and up to date or declined. Labs ordered and reviewed. Risk factors for depression reviewed and negative. Hearing function and visual acuity are intact. ADLs screened and addressed as needed. Functional ability and level of safety reviewed and appropriate. Education, counseling and referrals performed based on assessed risks today. Patient provided with a copy of personalized plan for preventive services.

## 2014-08-29 NOTE — Patient Instructions (Signed)
Preventive Care for Adults A healthy lifestyle and preventive care can promote health and wellness. Preventive health guidelines for women include the following key practices.  A routine yearly physical is a good way to check with your health care provider about your health and preventive screening. It is a chance to share any concerns and updates on your health and to receive a thorough exam.  Visit your dentist for a routine exam and preventive care every 6 months. Brush your teeth twice a day and floss once a day. Good oral hygiene prevents tooth decay and gum disease.  The frequency of eye exams is based on your age, health, family medical history, use of contact lenses, and other factors. Follow your health care provider's recommendations for frequency of eye exams.  Eat a healthy diet. Foods like vegetables, fruits, whole grains, low-fat dairy products, and lean protein foods contain the nutrients you need without too many calories. Decrease your intake of foods high in solid fats, added sugars, and salt. Eat the right amount of calories for you.Get information about a proper diet from your health care provider, if necessary.  Regular physical exercise is one of the most important things you can do for your health. Most adults should get at least 150 minutes of moderate-intensity exercise (any activity that increases your heart rate and causes you to sweat) each week. In addition, most adults need muscle-strengthening exercises on 2 or more days a week.  Maintain a healthy weight. The body mass index (BMI) is a screening tool to identify possible weight problems. It provides an estimate of body fat based on height and weight. Your health care provider can find your BMI and can help you achieve or maintain a healthy weight.For adults 20 years and older:  A BMI below 18.5 is considered underweight.  A BMI of 18.5 to 24.9 is normal.  A BMI of 25 to 29.9 is considered overweight.  A BMI of  30 and above is considered obese.  Maintain normal blood lipids and cholesterol levels by exercising and minimizing your intake of saturated fat. Eat a balanced diet with plenty of fruit and vegetables. Blood tests for lipids and cholesterol should begin at age 76 and be repeated every 5 years. If your lipid or cholesterol levels are high, you are over 50, or you are at high risk for heart disease, you may need your cholesterol levels checked more frequently.Ongoing high lipid and cholesterol levels should be treated with medicines if diet and exercise are not working.  If you smoke, find out from your health care provider how to quit. If you do not use tobacco, do not start.  Lung cancer screening is recommended for adults aged 22-80 years who are at high risk for developing lung cancer because of a history of smoking. A yearly low-dose CT scan of the lungs is recommended for people who have at least a 30-pack-year history of smoking and are a current smoker or have quit within the past 15 years. A pack year of smoking is smoking an average of 1 pack of cigarettes a day for 1 year (for example: 1 pack a day for 30 years or 2 packs a day for 15 years). Yearly screening should continue until the smoker has stopped smoking for at least 15 years. Yearly screening should be stopped for people who develop a health problem that would prevent them from having lung cancer treatment.  If you are pregnant, do not drink alcohol. If you are breastfeeding,  be very cautious about drinking alcohol. If you are not pregnant and choose to drink alcohol, do not have more than 1 drink per day. One drink is considered to be 12 ounces (355 mL) of beer, 5 ounces (148 mL) of wine, or 1.5 ounces (44 mL) of liquor.  Avoid use of street drugs. Do not share needles with anyone. Ask for help if you need support or instructions about stopping the use of drugs.  High blood pressure causes heart disease and increases the risk of  stroke. Your blood pressure should be checked at least every 1 to 2 years. Ongoing high blood pressure should be treated with medicines if weight loss and exercise do not work.  If you are 75-52 years old, ask your health care provider if you should take aspirin to prevent strokes.  Diabetes screening involves taking a blood sample to check your fasting blood sugar level. This should be done once every 3 years, after age 15, if you are within normal weight and without risk factors for diabetes. Testing should be considered at a younger age or be carried out more frequently if you are overweight and have at least 1 risk factor for diabetes.  Breast cancer screening is essential preventive care for women. You should practice "breast self-awareness." This means understanding the normal appearance and feel of your breasts and may include breast self-examination. Any changes detected, no matter how small, should be reported to a health care provider. Women in their 58s and 30s should have a clinical breast exam (CBE) by a health care provider as part of a regular health exam every 1 to 3 years. After age 16, women should have a CBE every year. Starting at age 53, women should consider having a mammogram (breast X-ray test) every year. Women who have a family history of breast cancer should talk to their health care provider about genetic screening. Women at a high risk of breast cancer should talk to their health care providers about having an MRI and a mammogram every year.  Breast cancer gene (BRCA)-related cancer risk assessment is recommended for women who have family members with BRCA-related cancers. BRCA-related cancers include breast, ovarian, tubal, and peritoneal cancers. Having family members with these cancers may be associated with an increased risk for harmful changes (mutations) in the breast cancer genes BRCA1 and BRCA2. Results of the assessment will determine the need for genetic counseling and  BRCA1 and BRCA2 testing.  Routine pelvic exams to screen for cancer are no longer recommended for nonpregnant women who are considered low risk for cancer of the pelvic organs (ovaries, uterus, and vagina) and who do not have symptoms. Ask your health care provider if a screening pelvic exam is right for you.  If you have had past treatment for cervical cancer or a condition that could lead to cancer, you need Pap tests and screening for cancer for at least 20 years after your treatment. If Pap tests have been discontinued, your risk factors (such as having a new sexual partner) need to be reassessed to determine if screening should be resumed. Some women have medical problems that increase the chance of getting cervical cancer. In these cases, your health care provider may recommend more frequent screening and Pap tests.  The HPV test is an additional test that may be used for cervical cancer screening. The HPV test looks for the virus that can cause the cell changes on the cervix. The cells collected during the Pap test can be  tested for HPV. The HPV test could be used to screen women aged 30 years and older, and should be used in women of any age who have unclear Pap test results. After the age of 30, women should have HPV testing at the same frequency as a Pap test.  Colorectal cancer can be detected and often prevented. Most routine colorectal cancer screening begins at the age of 50 years and continues through age 75 years. However, your health care provider may recommend screening at an earlier age if you have risk factors for colon cancer. On a yearly basis, your health care provider may provide home test kits to check for hidden blood in the stool. Use of a small camera at the end of a tube, to directly examine the colon (sigmoidoscopy or colonoscopy), can detect the earliest forms of colorectal cancer. Talk to your health care provider about this at age 50, when routine screening begins. Direct  exam of the colon should be repeated every 5-10 years through age 75 years, unless early forms of pre-cancerous polyps or small growths are found.  People who are at an increased risk for hepatitis B should be screened for this virus. You are considered at high risk for hepatitis B if:  You were born in a country where hepatitis B occurs often. Talk with your health care provider about which countries are considered high risk.  Your parents were born in a high-risk country and you have not received a shot to protect against hepatitis B (hepatitis B vaccine).  You have HIV or AIDS.  You use needles to inject street drugs.  You live with, or have sex with, someone who has hepatitis B.  You get hemodialysis treatment.  You take certain medicines for conditions like cancer, organ transplantation, and autoimmune conditions.  Hepatitis C blood testing is recommended for all people born from 1945 through 1965 and any individual with known risks for hepatitis C.  Practice safe sex. Use condoms and avoid high-risk sexual practices to reduce the spread of sexually transmitted infections (STIs). STIs include gonorrhea, chlamydia, syphilis, trichomonas, herpes, HPV, and human immunodeficiency virus (HIV). Herpes, HIV, and HPV are viral illnesses that have no cure. They can result in disability, cancer, and death.  You should be screened for sexually transmitted illnesses (STIs) including gonorrhea and chlamydia if:  You are sexually active and are younger than 24 years.  You are older than 24 years and your health care provider tells you that you are at risk for this type of infection.  Your sexual activity has changed since you were last screened and you are at an increased risk for chlamydia or gonorrhea. Ask your health care provider if you are at risk.  If you are at risk of being infected with HIV, it is recommended that you take a prescription medicine daily to prevent HIV infection. This is  called preexposure prophylaxis (PrEP). You are considered at risk if:  You are a heterosexual woman, are sexually active, and are at increased risk for HIV infection.  You take drugs by injection.  You are sexually active with a partner who has HIV.  Talk with your health care provider about whether you are at high risk of being infected with HIV. If you choose to begin PrEP, you should first be tested for HIV. You should then be tested every 3 months for as long as you are taking PrEP.  Osteoporosis is a disease in which the bones lose minerals and strength   with aging. This can result in serious bone fractures or breaks. The risk of osteoporosis can be identified using a bone density scan. Women ages 65 years and over and women at risk for fractures or osteoporosis should discuss screening with their health care providers. Ask your health care provider whether you should take a calcium supplement or vitamin D to reduce the rate of osteoporosis.  Menopause can be associated with physical symptoms and risks. Hormone replacement therapy is available to decrease symptoms and risks. You should talk to your health care provider about whether hormone replacement therapy is right for you.  Use sunscreen. Apply sunscreen liberally and repeatedly throughout the day. You should seek shade when your shadow is shorter than you. Protect yourself by wearing long sleeves, pants, a wide-brimmed hat, and sunglasses year round, whenever you are outdoors.  Once a month, do a whole body skin exam, using a mirror to look at the skin on your back. Tell your health care provider of new moles, moles that have irregular borders, moles that are larger than a pencil eraser, or moles that have changed in shape or color.  Stay current with required vaccines (immunizations).  Influenza vaccine. All adults should be immunized every year.  Tetanus, diphtheria, and acellular pertussis (Td, Tdap) vaccine. Pregnant women should  receive 1 dose of Tdap vaccine during each pregnancy. The dose should be obtained regardless of the length of time since the last dose. Immunization is preferred during the 27th-36th week of gestation. An adult who has not previously received Tdap or who does not know her vaccine status should receive 1 dose of Tdap. This initial dose should be followed by tetanus and diphtheria toxoids (Td) booster doses every 10 years. Adults with an unknown or incomplete history of completing a 3-dose immunization series with Td-containing vaccines should begin or complete a primary immunization series including a Tdap dose. Adults should receive a Td booster every 10 years.  Varicella vaccine. An adult without evidence of immunity to varicella should receive 2 doses or a second dose if she has previously received 1 dose. Pregnant females who do not have evidence of immunity should receive the first dose after pregnancy. This first dose should be obtained before leaving the health care facility. The second dose should be obtained 4-8 weeks after the first dose.  Human papillomavirus (HPV) vaccine. Females aged 13-26 years who have not received the vaccine previously should obtain the 3-dose series. The vaccine is not recommended for use in pregnant females. However, pregnancy testing is not needed before receiving a dose. If a female is found to be pregnant after receiving a dose, no treatment is needed. In that case, the remaining doses should be delayed until after the pregnancy. Immunization is recommended for any person with an immunocompromised condition through the age of 26 years if she did not get any or all doses earlier. During the 3-dose series, the second dose should be obtained 4-8 weeks after the first dose. The third dose should be obtained 24 weeks after the first dose and 16 weeks after the second dose.  Zoster vaccine. One dose is recommended for adults aged 60 years or older unless certain conditions are  present.  Measles, mumps, and rubella (MMR) vaccine. Adults born before 1957 generally are considered immune to measles and mumps. Adults born in 1957 or later should have 1 or more doses of MMR vaccine unless there is a contraindication to the vaccine or there is laboratory evidence of immunity to   each of the three diseases. A routine second dose of MMR vaccine should be obtained at least 28 days after the first dose for students attending postsecondary schools, health care workers, or international travelers. People who received inactivated measles vaccine or an unknown type of measles vaccine during 1963-1967 should receive 2 doses of MMR vaccine. People who received inactivated mumps vaccine or an unknown type of mumps vaccine before 1979 and are at high risk for mumps infection should consider immunization with 2 doses of MMR vaccine. For females of childbearing age, rubella immunity should be determined. If there is no evidence of immunity, females who are not pregnant should be vaccinated. If there is no evidence of immunity, females who are pregnant should delay immunization until after pregnancy. Unvaccinated health care workers born before 1957 who lack laboratory evidence of measles, mumps, or rubella immunity or laboratory confirmation of disease should consider measles and mumps immunization with 2 doses of MMR vaccine or rubella immunization with 1 dose of MMR vaccine.  Pneumococcal 13-valent conjugate (PCV13) vaccine. When indicated, a person who is uncertain of her immunization history and has no record of immunization should receive the PCV13 vaccine. An adult aged 19 years or older who has certain medical conditions and has not been previously immunized should receive 1 dose of PCV13 vaccine. This PCV13 should be followed with a dose of pneumococcal polysaccharide (PPSV23) vaccine. The PPSV23 vaccine dose should be obtained at least 8 weeks after the dose of PCV13 vaccine. An adult aged 19  years or older who has certain medical conditions and previously received 1 or more doses of PPSV23 vaccine should receive 1 dose of PCV13. The PCV13 vaccine dose should be obtained 1 or more years after the last PPSV23 vaccine dose.  Pneumococcal polysaccharide (PPSV23) vaccine. When PCV13 is also indicated, PCV13 should be obtained first. All adults aged 65 years and older should be immunized. An adult younger than age 65 years who has certain medical conditions should be immunized. Any person who resides in a nursing home or long-term care facility should be immunized. An adult smoker should be immunized. People with an immunocompromised condition and certain other conditions should receive both PCV13 and PPSV23 vaccines. People with human immunodeficiency virus (HIV) infection should be immunized as soon as possible after diagnosis. Immunization during chemotherapy or radiation therapy should be avoided. Routine use of PPSV23 vaccine is not recommended for American Indians, [place] Natives, or people younger than 65 years unless there are medical conditions that require PPSV23 vaccine. When indicated, people who have unknown immunization and have no record of immunization should receive PPSV23 vaccine. One-time revaccination 5 years after the first dose of PPSV23 is recommended for people aged 19-64 years who have chronic kidney failure, nephrotic syndrome, asplenia, or immunocompromised conditions. People who received 1-2 doses of PPSV23 before age 65 years should receive another dose of PPSV23 vaccine at age 65 years or later if at least 5 years have passed since the previous dose. Doses of PPSV23 are not needed for people immunized with PPSV23 at or after age 65 years.  Meningococcal vaccine. Adults with asplenia or persistent complement component deficiencies should receive 2 doses of quadrivalent meningococcal conjugate (MenACWY-D) vaccine. The doses should be obtained at least 2 months apart.  Microbiologists working with certain meningococcal bacteria, military recruits, people at risk during an outbreak, and people who travel to or live in countries with a high rate of meningitis should be immunized. A first-year college student up through age   21 years who is living in a residence hall should receive a dose if she did not receive a dose on or after her 16th birthday. Adults who have certain high-risk conditions should receive one or more doses of vaccine.  Hepatitis A vaccine. Adults who wish to be protected from this disease, have certain high-risk conditions, work with hepatitis A-infected animals, work in hepatitis A research labs, or travel to or work in countries with a high rate of hepatitis A should be immunized. Adults who were previously unvaccinated and who anticipate close contact with an international adoptee during the first 60 days after arrival in the [place] from a country with a high rate of hepatitis A should be immunized.  Hepatitis B vaccine. Adults who wish to be protected from this disease, have certain high-risk conditions, may be exposed to blood or other infectious body fluids, are household contacts or sex partners of hepatitis B positive people, are clients or workers in certain care facilities, or travel to or work in countries with a high rate of hepatitis B should be immunized.  Haemophilus influenzae type b (Hib) vaccine. A previously unvaccinated person with asplenia or sickle cell disease or having a scheduled splenectomy should receive 1 dose of Hib vaccine. Regardless of previous immunization, a recipient of a hematopoietic stem cell transplant should receive a 3-dose series 6-12 months after her successful transplant. Hib vaccine is not recommended for adults with HIV infection. Preventive Services / Frequency Ages 64 to 68 years  Blood pressure check.** / Every 1 to 2 years.  Lipid and cholesterol check.** / Every 5 years beginning at age  22.  Clinical breast exam.** / Every 3 years for women in their 88s and 53s.  BRCA-related cancer risk assessment.** / For women who have family members with a BRCA-related cancer (breast, ovarian, tubal, or peritoneal cancers).  Pap test.** / Every 2 years from ages 90 through 51. Every 3 years starting at age 21 through age 56 or 3 with a history of 3 consecutive normal Pap tests.  HPV screening.** / Every 3 years from ages 24 through ages 1 to 46 with a history of 3 consecutive normal Pap tests.  Hepatitis C blood test.** / For any individual with known risks for hepatitis C.  Skin self-exam. / Monthly.  Influenza vaccine. / Every year.  Tetanus, diphtheria, and acellular pertussis (Tdap, Td) vaccine.** / Consult your health care provider. Pregnant women should receive 1 dose of Tdap vaccine during each pregnancy. 1 dose of Td every 10 years.  Varicella vaccine.** / Consult your health care provider. Pregnant females who do not have evidence of immunity should receive the first dose after pregnancy.  HPV vaccine. / 3 doses over 6 months, if 72 and younger. The vaccine is not recommended for use in pregnant females. However, pregnancy testing is not needed before receiving a dose.  Measles, mumps, rubella (MMR) vaccine.** / You need at least 1 dose of MMR if you were born in 1957 or later. You may also need a 2nd dose. For females of childbearing age, rubella immunity should be determined. If there is no evidence of immunity, females who are not pregnant should be vaccinated. If there is no evidence of immunity, females who are pregnant should delay immunization until after pregnancy.  Pneumococcal 13-valent conjugate (PCV13) vaccine.** / Consult your health care provider.  Pneumococcal polysaccharide (PPSV23) vaccine.** / 1 to 2 doses if you smoke cigarettes or if you have certain conditions.  Meningococcal vaccine.** /  1 dose if you are age 19 to 21 years and a first-year college  student living in a residence hall, or have one of several medical conditions, you need to get vaccinated against meningococcal disease. You may also need additional booster doses.  Hepatitis A vaccine.** / Consult your health care provider.  Hepatitis B vaccine.** / Consult your health care provider.  Haemophilus influenzae type b (Hib) vaccine.** / Consult your health care provider. Ages 40 to 64 years  Blood pressure check.** / Every 1 to 2 years.  Lipid and cholesterol check.** / Every 5 years beginning at age 20 years.  Lung cancer screening. / Every year if you are aged 55-80 years and have a 30-pack-year history of smoking and currently smoke or have quit within the past 15 years. Yearly screening is stopped once you have quit smoking for at least 15 years or develop a health problem that would prevent you from having lung cancer treatment.  Clinical breast exam.** / Every year after age 40 years.  BRCA-related cancer risk assessment.** / For women who have family members with a BRCA-related cancer (breast, ovarian, tubal, or peritoneal cancers).  Mammogram.** / Every year beginning at age 40 years and continuing for as long as you are in good health. Consult with your health care provider.  Pap test.** / Every 3 years starting at age 30 years through age 65 or 70 years with a history of 3 consecutive normal Pap tests.  HPV screening.** / Every 3 years from ages 30 years through ages 65 to 70 years with a history of 3 consecutive normal Pap tests.  Fecal occult blood test (FOBT) of stool. / Every year beginning at age 50 years and continuing until age 75 years. You may not need to do this test if you get a colonoscopy every 10 years.  Flexible sigmoidoscopy or colonoscopy.** / Every 5 years for a flexible sigmoidoscopy or every 10 years for a colonoscopy beginning at age 50 years and continuing until age 75 years.  Hepatitis C blood test.** / For all people born from 1945 through  1965 and any individual with known risks for hepatitis C.  Skin self-exam. / Monthly.  Influenza vaccine. / Every year.  Tetanus, diphtheria, and acellular pertussis (Tdap/Td) vaccine.** / Consult your health care provider. Pregnant women should receive 1 dose of Tdap vaccine during each pregnancy. 1 dose of Td every 10 years.  Varicella vaccine.** / Consult your health care provider. Pregnant females who do not have evidence of immunity should receive the first dose after pregnancy.  Zoster vaccine.** / 1 dose for adults aged 60 years or older.  Measles, mumps, rubella (MMR) vaccine.** / You need at least 1 dose of MMR if you were born in 1957 or later. You may also need a 2nd dose. For females of childbearing age, rubella immunity should be determined. If there is no evidence of immunity, females who are not pregnant should be vaccinated. If there is no evidence of immunity, females who are pregnant should delay immunization until after pregnancy.  Pneumococcal 13-valent conjugate (PCV13) vaccine.** / Consult your health care provider.  Pneumococcal polysaccharide (PPSV23) vaccine.** / 1 to 2 doses if you smoke cigarettes or if you have certain conditions.  Meningococcal vaccine.** / Consult your health care provider.  Hepatitis A vaccine.** / Consult your health care provider.  Hepatitis B vaccine.** / Consult your health care provider.  Haemophilus influenzae type b (Hib) vaccine.** / Consult your health care provider. Ages 65   years and over  Blood pressure check.** / Every 1 to 2 years.  Lipid and cholesterol check.** / Every 5 years beginning at age 22 years.  Lung cancer screening. / Every year if you are aged 73-80 years and have a 30-pack-year history of smoking and currently smoke or have quit within the past 15 years. Yearly screening is stopped once you have quit smoking for at least 15 years or develop a health problem that would prevent you from having lung cancer  treatment.  Clinical breast exam.** / Every year after age 4 years.  BRCA-related cancer risk assessment.** / For women who have family members with a BRCA-related cancer (breast, ovarian, tubal, or peritoneal cancers).  Mammogram.** / Every year beginning at age 40 years and continuing for as long as you are in good health. Consult with your health care provider.  Pap test.** / Every 3 years starting at age 9 years through age 34 or 91 years with 3 consecutive normal Pap tests. Testing can be stopped between 65 and 70 years with 3 consecutive normal Pap tests and no abnormal Pap or HPV tests in the past 10 years.  HPV screening.** / Every 3 years from ages 57 years through ages 64 or 45 years with a history of 3 consecutive normal Pap tests. Testing can be stopped between 65 and 70 years with 3 consecutive normal Pap tests and no abnormal Pap or HPV tests in the past 10 years.  Fecal occult blood test (FOBT) of stool. / Every year beginning at age 15 years and continuing until age 17 years. You may not need to do this test if you get a colonoscopy every 10 years.  Flexible sigmoidoscopy or colonoscopy.** / Every 5 years for a flexible sigmoidoscopy or every 10 years for a colonoscopy beginning at age 86 years and continuing until age 71 years.  Hepatitis C blood test.** / For all people born from [birth year] through 1967 and any individual with known risks for hepatitis C.  Osteoporosis screening.** / A one-time screening for women ages 83 years and over and women at risk for fractures or osteoporosis.  Skin self-exam. / Monthly.  Influenza vaccine. / Every year.  Tetanus, diphtheria, and acellular pertussis (Tdap/Td) vaccine.** / 1 dose of Td every 10 years.  Varicella vaccine.** / Consult your health care provider.  Zoster vaccine.** / 1 dose for adults aged 61 years or older.  Pneumococcal 13-valent conjugate (PCV13) vaccine.** / Consult your health care provider.  Pneumococcal  polysaccharide (PPSV23) vaccine.** / 1 dose for all adults aged 28 years and older.  Meningococcal vaccine.** / Consult your health care provider.  Hepatitis A vaccine.** / Consult your health care provider.  Hepatitis B vaccine.** / Consult your health care provider.  Haemophilus influenzae type b (Hib) vaccine.** / Consult your health care provider. ** Family history and personal history of risk and conditions may change your health care provider's recommendations. Document Released: 08/04/2001 Document Revised: 10/23/2013 Document Reviewed: 11/03/2010 [facility] Patient Information 2015 [place], [place]. This information is not intended to replace advice given to you by your health care provider. Make sure you discuss any questions you have with your health care provider.

## 2014-08-29 NOTE — Assessment & Plan Note (Signed)
Will ref for a formal eval

## 2014-08-31 LAB — CULTURE, URINE COMPREHENSIVE
COLONY COUNT: NO GROWTH
ORGANISM ID, BACTERIA: NO GROWTH

## 2014-09-05 ENCOUNTER — Telehealth: Payer: Self-pay | Admitting: *Deleted

## 2014-09-05 ENCOUNTER — Telehealth: Payer: Self-pay

## 2014-09-05 ENCOUNTER — Telehealth: Payer: Self-pay | Admitting: Internal Medicine

## 2014-09-05 NOTE — Telephone Encounter (Signed)
[facility] - Client [facility] [facility] Patient Name: [name] Gender: Female DOB: 07-10-1945 Age: 69 Y 92 M 2 D Return Phone Number: [phone] (Primary) Address: City/State/Zip: [place] [address] Client [facility] - Client Client Site [place] - Day Physician Plotnikov, Alex Contact Type Call Call Type Triage / Clinical Relationship To Patient Self Appointment Disposition EMR Appointment Scheduled Info pasted into Epic Yes Return Phone Number [phone] (Primary) Chief Complaint Immunization Reaction Initial Comment Caller states was given pneumonia shot last Wed. having swelling, itching, red and painful PreDisposition Call Doctor Nurse Assessment Nurse: Erlene Quan, RN, Manuela Schwartz Date/Time Eilene Ghazi Time): 09/05/2014 2:46:07 PM Confirm and document reason for call. If symptomatic, describe symptoms. ---Caller states was given pneumonia shot last Wednesday - having swelling, itching, red and painfulness at the injection site - even a shirt with sleeves hurt the site - states the site looked fine up until a few days ago but now possible infection Has the patient traveled out of the country within the last 30 days? ---Not Applicable Does the patient require triage? ---Yes Related visit to physician within the last 2 weeks? ---Yes Does the PT have any chronic conditions? (i.e. diabetes, asthma, etc.) ---No Guidelines Guideline Title Affirmed Question Affirmed Notes Nurse Date/Time (Eastern Time) Immunization Reactions [1] Redness or red streak around the injection site AND [2] begins > 48 hours after shot AND [3] no fever (Exception: red area < 1 inch or 2.5 cm wide) Macario Carls 09/05/2014 2:48:39 PM Disp. Time Eilene Ghazi Time) Disposition Final User 09/05/2014 2:49:52 PM See Physician within 24 Hours Yes Erlene Quan, RN, Manuela Schwartz PLEASE NOTE: All timestamps contained within this  report are represented as [place] Standard Time. CONFIDENTIALTY NOTICE: This fax transmission is intended only for the addressee. It contains information that is legally privileged, confidential or otherwise protected from use or disclosure. If you are not the intended recipient, you are strictly prohibited from reviewing, disclosing, copying using or disseminating any of this information or taking any action in reliance on or regarding this information. If you have received this fax in error, please notify us immediately by telephone so that we can arrange for its return to [place]. Phone: [phone], Toll-Free: [phone], Fax: [phone] Page: 2 of 2 Call Id: [id number] Caller Understands: Yes Disagree/Comply: Comply Care Advice Given Per Guideline SEE PHYSICIAN WITHIN 24 HOURS: PAIN MEDICINES: * For pain relief, take acetaminophen, ibuprofen, or naproxen. * Use the lowest amount that makes your pain feel better. ACETAMINOPHEN (E.G., TYLENOL): * Take 650 mg (two 325 mg pills) by mouth every 4-6 hours as needed. Each Regular Strength Tylenol pill has 325 mg of acetaminophen. The most you should take each day is 3,250 mg (10 Regular Strength pills a day). * Another choice is to take 1,000 mg (two 500 mg pills) every 8 hours as needed. Each Extra Strength Tylenol pill has 500 mg of acetaminophen. The most you should take each day is 3,000 mg (6 Extra Strength pills a day). IBUPROFEN (E.G., MOTRIN, ADVIL): * Take 400 mg (two 200 mg pills) by mouth every 6 hours as needed. * Another choice is to take 600 mg (three 200 mg pills) by mouth every 8 hours as needed. * The most you should take each day is 1,200 mg (six 200 mg pills a day), unless your doctor has told you to take more. LOCAL HEAT: Apply warm wet washcloth or a heating pad for 20 minutes four  times a day for pain relief. * Fever occurs CALL BACK IF: * You become worse. CARE ADVICE given per Immunization Reactions (Adult) guideline. After  Care Instructions Given Call Event Type User Date / Time Description Comments User: Clint Guy, RN Date/Time Eilene Ghazi Time): 09/05/2014 3:07:29 PM appointment scheduled with Dr Elna Breslow 09-06-14 @ 3:15 PM Referrals REFERRED TO PCP OFFICE

## 2014-09-05 NOTE — Telephone Encounter (Signed)
PLEASE NOTE: All timestamps contained within this report are represented as [place] Standard Time. CONFIDENTIALTY NOTICE: This fax transmission is intended only for the addressee. It contains information that is legally privileged, confidential or otherwise protected from use or disclosure. If you are not the intended recipient, you are strictly prohibited from reviewing, disclosing, copying using or disseminating any of this information or taking any action in reliance on or regarding this information. If you have received this fax in error, please notify us immediately by telephone so that we can arrange for its return to [place]. Phone: [phone], Toll-Free: [phone], Fax: [phone] Page: 1 of 1 Call Id: [id number] [name] - Client [place] Patient Name: [name] DOB: [birth year] Initial Comment Caller states was given pneumonia shot last Wed. having swelling, itching, red and painful Nurse Assessment Nurse: Erlene Quan RN, Manuela Schwartz Date/Time Eilene Ghazi Time): 09/05/2014 2:46:07 PM Confirm and document reason for call. If symptomatic, describe symptoms. ---Caller states was given pneumonia shot last Wednesday - having swelling, itching, red and painfulness at the injection site - even a shirt with sleeves hurt the site - states the site looked fine up until a few days ago but now possible infection Has the patient traveled out of the country within the last 30 days? ---Not Applicable Does the patient require triage? ---Yes Related visit to physician within the last 2 weeks? ---Yes Does the PT have any chronic conditions? (i.e. diabetes, asthma, etc.) ---No Guidelines Guideline Title Affirmed Question Affirmed Notes Immunization Reactions [1] Redness or red streak around the injection site AND [2] begins > 48 hours after shot AND [3] no fever (Exception: red area < 1 inch or 2.5 cm wide) Final Disposition  User See Physician within 24 Hours Erlene Quan, RN, Manuela Schwartz Comments appointment scheduled with Dr Elna Breslow 09-06-14 @ 3:15 PM

## 2014-09-05 NOTE — Telephone Encounter (Signed)
Left message for pt to call back if he wants flu shot

## 2014-09-06 ENCOUNTER — Ambulatory Visit (INDEPENDENT_AMBULATORY_CARE_PROVIDER_SITE_OTHER): Payer: Commercial Managed Care - HMO | Admitting: Family

## 2014-09-06 ENCOUNTER — Encounter: Payer: Self-pay | Admitting: Family

## 2014-09-06 VITALS — BP 128/82 | HR 64 | Temp 97.8°F | Resp 18 | Wt 159.0 lb

## 2014-09-06 DIAGNOSIS — T8090XA Unspecified complication following infusion and therapeutic injection, initial encounter: Secondary | ICD-10-CM | POA: Insufficient documentation

## 2014-09-06 MED ORDER — AMOXICILLIN 875 MG PO TABS: 875.0000 mg | ORAL_TABLET | Freq: Two times a day (BID) | ORAL | Status: DC

## 2014-09-06 NOTE — Patient Instructions (Signed)
Thank you for choosing [facility].  Summary/Instructions:  Your prescription(s) have been submitted to your pharmacy or been printed and provided for you. Please take as directed and contact our office if you believe you are having problem(s) with the medication(s) or have any questions.  If your symptoms worsen or fail to improve, please contact our office for further instruction, or in case of emergency go directly to the emergency room at the closest medical facility.     

## 2014-09-06 NOTE — Assessment & Plan Note (Signed)
Symptoms and exam consistent with injection site reaction. Start amoxicillin. Continue to clean wound clean and dry. Follow-up if symptoms worsen or fail to improve.

## 2014-09-06 NOTE — Progress Notes (Signed)
   Subjective:    Patient ID: [name], female    DOB: July 11, 1945, 69 y.o.   MRN: [id number]  Chief Complaint  Patient presents with  . Arm Pain    Left arm swollen, red, hot to the touch, and irritated, started 5 days after the pneumonia shot, the sx started x4 days ago    HPI:  [name] is a 69 y.o. female who presents today for an acute visit.  Associated symptoms of left arm pain started about 4 days ago and is located in her left upper arm. Area is hot to touch, and irritated. Pain is described as bruise like. Has tried triamcinalone cream which she indicates has helped some. The rash started around the injection site and has progressively expanded.   Allergies  Allergen Reactions  . Bupropion Hcl     REACTION: head buzzing  . Fluoxetine Hcl     REACTION: anxious  . Latex   . Propoxyphene N-Acetaminophen   . Venlafaxine     REACTION: anxious    Current Outpatient Prescriptions on File Prior to Visit  Medication Sig Dispense Refill  . ciprofloxacin (CIPRO) 250 MG tablet Take 1 tablet (250 mg total) by mouth 2 (two) times daily. 6 tablet 0  . citalopram (CELEXA) 20 MG tablet Take 1 tablet (20 mg total) by mouth daily. 90 tablet 3  . conjugated estrogens (PREMARIN) vaginal cream Place vaginally every 3 (three) days. pv 42.5 g 6  . diphenoxylate-atropine (LOMOTIL) 2.5-0.025 MG per tablet Take 1 tablet by mouth 2 (two) times daily. 30 tablet 3  . hydrocortisone (ANUSOL-HC) 2.5 % rectal cream Place 1 application rectally 2 (two) times daily. Needs applicator to place some cream inside 30 g 0  . pantoprazole (PROTONIX) 40 MG tablet Take 1 tablet (40 mg total) by mouth daily. 90 tablet 3  . Suvorexant (BELSOMRA) 15 MG TABS Take 15 mg by mouth at bedtime as needed. 90 tablet 1  . loratadine (CLARITIN) 10 MG tablet Take 1 tablet (10 mg total) by mouth daily. 30 tablet 11   No current facility-administered medications on file prior to visit.    Review of Systems   Constitutional: Negative for fever and chills.  Skin: Positive for rash.      Objective:    BP 128/82 mmHg  Pulse 64  Temp(Src) 97.8 F (36.6 C) (Oral)  Resp 18  Wt 159 lb (72.122 kg)  SpO2 98% Nursing note and vital signs reviewed.  Physical Exam  Constitutional: She is oriented to person, place, and time. She appears well-developed and well-nourished. No distress.  Cardiovascular: Normal rate, regular rhythm, normal heart sounds and intact distal pulses.   Pulmonary/Chest: Effort normal and breath sounds normal.  Neurological: She is alert and oriented to person, place, and time.  Skin: Skin is warm and dry.  Reddened, warm area covering her left deltoid down to about halfway down up her arm. Tenderness elicited over injection site of left deltoid.  Psychiatric: She has a normal mood and affect. Her behavior is normal. Judgment and thought content normal.       Assessment & Plan:

## 2014-09-06 NOTE — Progress Notes (Signed)
Pre visit review using our clinic review tool, if applicable. No additional management support is needed unless otherwise documented below in the visit note. 

## 2014-09-17 ENCOUNTER — Telehealth: Payer: Self-pay

## 2014-09-17 MED ORDER — ZALEPLON 5 MG PO CAPS: 5.0000 mg | ORAL_CAPSULE | Freq: Every evening | ORAL | Status: DC | PRN

## 2014-09-17 NOTE — Telephone Encounter (Signed)
Zaleplon ok Thx

## 2014-09-17 NOTE — Telephone Encounter (Signed)
Preferred formularies include: Zapeplon and temazepam. Please advise.

## 2014-09-17 NOTE — Telephone Encounter (Signed)
PA for Belsomra denied

## 2014-09-17 NOTE — Addendum Note (Signed)
Addended by: Cassandria Anger on: 09/17/2014 05:26 PM   Modules accepted: Orders, Medications

## 2014-09-18 NOTE — Telephone Encounter (Signed)
Left detailed mess informing pt.  

## 2014-10-11 ENCOUNTER — Ambulatory Visit (INDEPENDENT_AMBULATORY_CARE_PROVIDER_SITE_OTHER): Payer: Commercial Managed Care - HMO | Admitting: Internal Medicine

## 2014-10-11 ENCOUNTER — Encounter: Payer: Self-pay | Admitting: Internal Medicine

## 2014-10-11 VITALS — BP 130/84 | HR 64 | Temp 98.2°F | Wt 163.0 lb

## 2014-10-11 DIAGNOSIS — R300 Dysuria: Secondary | ICD-10-CM

## 2014-10-11 DIAGNOSIS — N3289 Other specified disorders of bladder: Secondary | ICD-10-CM | POA: Diagnosis not present

## 2014-10-11 MED ORDER — AMOXICILLIN 875 MG PO TABS: 875.0000 mg | ORAL_TABLET | Freq: Two times a day (BID) | ORAL | Status: DC

## 2014-10-11 MED ORDER — MIRABEGRON ER 50 MG PO TB24: 50.0000 mg | ORAL_TABLET | Freq: Every day | ORAL | Status: DC

## 2014-10-11 MED ORDER — GENTLECATH URINARY CATHETER MISC: Status: DC

## 2014-10-11 MED ORDER — PHENAZOPYRIDINE HCL 100 MG PO TABS: 100.0000 mg | ORAL_TABLET | Freq: Three times a day (TID) | ORAL | Status: DC | PRN

## 2014-10-11 MED ORDER — FLUCONAZOLE 150 MG PO TABS: 150.0000 mg | ORAL_TABLET | Freq: Once | ORAL | Status: DC

## 2014-10-11 NOTE — Assessment & Plan Note (Signed)
Recurrent UA and Cx Amoxicillin po Diflucan

## 2014-10-11 NOTE — Progress Notes (Signed)
Pre visit review using our clinic review tool, if applicable. No additional management support is needed unless otherwise documented below in the visit note. 

## 2014-10-11 NOTE — Assessment & Plan Note (Signed)
Myrbetriq qd

## 2014-10-11 NOTE — Progress Notes (Signed)
Subjective:    Dysuria  This is a recurrent problem. The pain is moderate. Associated symptoms include frequency. Pertinent negatives include no chills, discharge, hematuria, nausea or vomiting. The treatment provided moderate relief.  Urinary Frequency  Associated symptoms include frequency. Pertinent negatives include no chills, discharge, hematuria, nausea or vomiting.  Pt saw her GYN: swab and UA were negative 1.5 weeks ago... Pyridium helped.  Urol Dr Carlota Raspberry in [place], had 3 surgeries; she has to use a catheter in and out - very rare...  The patient is here to follow up on chronic Vit B12 deficiency, depression, anxiety, headaches and chronic moderate fibromyalgia symptoms controlled with medicines.  Lactose free milk helped bloating  BP Readings from Last 3 Encounters:  10/11/14 130/84  09/06/14 128/82  04/27/14 114/66   Wt Readings from Last 3 Encounters:  10/11/14 163 lb (73.936 kg)  09/06/14 159 lb (72.122 kg)  08/29/14 159 lb (72.122 kg)      Review of Systems  Constitutional: Negative for fever, chills, diaphoresis, activity change, appetite change, fatigue and unexpected weight change.  HENT: Negative for congestion, dental problem, ear pain, hearing loss, mouth sores, postnasal drip, sinus pressure, sneezing, sore throat and voice change.   Eyes: Negative for pain and visual disturbance.  Respiratory: Negative for cough, chest tightness, wheezing and stridor.   Cardiovascular: Negative for chest pain, palpitations and leg swelling.  Gastrointestinal: Negative for nausea, vomiting, abdominal pain, blood in stool, abdominal distention and rectal pain.  Genitourinary: Positive for dysuria and frequency. Negative for hematuria, decreased urine volume, vaginal bleeding, vaginal discharge, difficulty urinating, vaginal pain and menstrual problem.  Musculoskeletal: Negative for back pain, joint swelling, gait problem and neck pain.  Skin: Negative for color change, rash  and wound.  Neurological: Negative for dizziness, tremors, syncope, speech difficulty and light-headedness.  Hematological: Negative for adenopathy.  Psychiatric/Behavioral: Negative for suicidal ideas, hallucinations, behavioral problems, confusion and sleep disturbance. The patient is nervous/anxious. The patient is not hyperactive.        Objective:   Physical Exam  Constitutional: She appears well-developed. No distress.  HENT:  Head: Normocephalic.  Right Ear: External ear normal.  Left Ear: External ear normal.  Nose: Nose normal.  Mouth/Throat: Oropharynx is clear and moist.  Eyes: Conjunctivae are normal. Pupils are equal, round, and reactive to light. Right eye exhibits no discharge. Left eye exhibits no discharge.  Neck: Normal range of motion. Neck supple. No JVD present. No tracheal deviation present. No thyromegaly present.  Cardiovascular: Normal rate, regular rhythm and normal heart sounds.   Pulmonary/Chest: No stridor. No respiratory distress. She has no wheezes.  Abdominal: Soft. Bowel sounds are normal. She exhibits no distension and no mass. There is no tenderness. There is no rebound and no guarding.  Musculoskeletal: She exhibits no edema or tenderness.  Lymphadenopathy:    She has no cervical adenopathy.  Neurological: She displays normal reflexes. No cranial nerve deficit. She exhibits normal muscle tone. Coordination normal.  Skin: No rash noted. No erythema.  Psychiatric: She has a normal mood and affect. Her behavior is normal. Judgment and thought content normal.    Lab Results  Component Value Date   WBC 7.7 08/29/2014   HGB 13.3 08/29/2014   HCT 38.7 08/29/2014   PLT 271.0 08/29/2014   GLUCOSE 90 08/29/2014   CHOL 196 08/29/2014   TRIG 263.0* 08/29/2014   HDL 60.70 08/29/2014   LDLDIRECT 109.0 08/29/2014   ALT 12 08/29/2014   AST 14 08/29/2014  NA 137 08/29/2014   K 4.6 08/29/2014   CL 105 08/29/2014   CREATININE 0.79 08/29/2014   BUN 20  08/29/2014   CO2 28 08/29/2014   TSH 2.41 08/29/2014    Cologuard (-) 2015     Assessment & Plan:

## 2014-11-01 ENCOUNTER — Encounter: Payer: Self-pay | Admitting: Gastroenterology

## 2014-11-30 ENCOUNTER — Ambulatory Visit: Payer: Commercial Managed Care - HMO | Admitting: Internal Medicine

## 2014-11-30 DIAGNOSIS — Z0289 Encounter for other administrative examinations: Secondary | ICD-10-CM

## 2014-12-11 ENCOUNTER — Encounter: Payer: Self-pay | Admitting: *Deleted

## 2015-02-08 ENCOUNTER — Telehealth: Payer: Self-pay | Admitting: Internal Medicine

## 2015-02-08 MED ORDER — ESTROGENS, CONJUGATED 0.625 MG/GM VA CREA: TOPICAL_CREAM | VAGINAL | Status: AC

## 2015-02-08 NOTE — Telephone Encounter (Signed)
Regarding   conjugated estrogens (PREMARIN) vaginal cream [[id number]]      . CURRENT SIG- 42.5 g dispense qty. The patients insurance is charging too much for the patient to afford at this qty/refill rate  According to [facility] [address], the only way to do a 30 days script with refills (as the patient requests) is to RX a 30.0 g container dispense qty with refills. Please [facility] [address] for this. The patient states that she is out of the cream.

## 2015-02-08 NOTE — Telephone Encounter (Signed)
New rx sent. See meds. Pt informed

## 2015-02-18 ENCOUNTER — Telehealth: Payer: Self-pay | Admitting: Internal Medicine

## 2015-02-18 DIAGNOSIS — K589 Irritable bowel syndrome without diarrhea: Secondary | ICD-10-CM

## 2015-02-18 NOTE — Telephone Encounter (Signed)
auth for what? Thx

## 2015-02-18 NOTE — Telephone Encounter (Signed)
Patient stated that she need a Authorization before her next office visit with Dr Henrene Pastor, so [employer] will pay, please advise

## 2015-02-19 NOTE — Telephone Encounter (Signed)
She needs a referral placed per [facility].

## 2015-02-19 NOTE — Telephone Encounter (Signed)
Ok Thx 

## 2015-03-20 ENCOUNTER — Other Ambulatory Visit (INDEPENDENT_AMBULATORY_CARE_PROVIDER_SITE_OTHER): Payer: Commercial Managed Care - HMO

## 2015-03-20 ENCOUNTER — Ambulatory Visit (INDEPENDENT_AMBULATORY_CARE_PROVIDER_SITE_OTHER): Payer: Commercial Managed Care - HMO | Admitting: Internal Medicine

## 2015-03-20 ENCOUNTER — Encounter: Payer: Self-pay | Admitting: Internal Medicine

## 2015-03-20 VITALS — BP 108/72 | HR 80 | Wt 163.0 lb

## 2015-03-20 DIAGNOSIS — G4700 Insomnia, unspecified: Secondary | ICD-10-CM | POA: Diagnosis not present

## 2015-03-20 DIAGNOSIS — Z23 Encounter for immunization: Secondary | ICD-10-CM | POA: Diagnosis not present

## 2015-03-20 DIAGNOSIS — F4323 Adjustment disorder with mixed anxiety and depressed mood: Secondary | ICD-10-CM

## 2015-03-20 DIAGNOSIS — E538 Deficiency of other specified B group vitamins: Secondary | ICD-10-CM | POA: Diagnosis not present

## 2015-03-20 DIAGNOSIS — R300 Dysuria: Secondary | ICD-10-CM | POA: Diagnosis not present

## 2015-03-20 LAB — CBC WITH DIFFERENTIAL/PLATELET
Basophils Absolute: 0 10*3/uL (ref 0.0–0.1)
Basophils Relative: 0.5 % (ref 0.0–3.0)
EOS PCT: 0.9 % (ref 0.0–5.0)
Eosinophils Absolute: 0.1 10*3/uL (ref 0.0–0.7)
HCT: 42 % (ref 36.0–46.0)
Hemoglobin: 14 g/dL (ref 12.0–15.0)
LYMPHS ABS: 2.5 10*3/uL (ref 0.7–4.0)
Lymphocytes Relative: 32.6 % (ref 12.0–46.0)
MCHC: 33.4 g/dL (ref 30.0–36.0)
MCV: 91.1 fl (ref 78.0–100.0)
MONO ABS: 0.3 10*3/uL (ref 0.1–1.0)
Monocytes Relative: 4 % (ref 3.0–12.0)
NEUTROS ABS: 4.8 10*3/uL (ref 1.4–7.7)
NEUTROS PCT: 62 % (ref 43.0–77.0)
PLATELETS: 301 10*3/uL (ref 150.0–400.0)
RBC: 4.62 Mil/uL (ref 3.87–5.11)
RDW: 14.3 % (ref 11.5–15.5)
WBC: 7.8 10*3/uL (ref 4.0–10.5)

## 2015-03-20 LAB — BASIC METABOLIC PANEL
BUN: 13 mg/dL (ref 6–23)
CO2: 30 meq/L (ref 19–32)
Calcium: 9.2 mg/dL (ref 8.4–10.5)
Chloride: 104 mEq/L (ref 96–112)
Creatinine, Ser: 0.84 mg/dL (ref 0.40–1.20)
GFR: 70.98 mL/min (ref >60.00)
GLUCOSE: 118 mg/dL — AB (ref 70–99)
POTASSIUM: 4.3 meq/L (ref 3.5–5.1)
Sodium: 141 mEq/L (ref 135–145)

## 2015-03-20 LAB — URINALYSIS
BILIRUBIN URINE: NEGATIVE
Hgb urine dipstick: NEGATIVE
KETONES UR: NEGATIVE
LEUKOCYTES UA: NEGATIVE
Nitrite: NEGATIVE
PH: 6 (ref 5.0–8.0)
Specific Gravity, Urine: >=1.03 — AB (ref 1.000–1.030)
TOTAL PROTEIN, URINE-UPE24: NEGATIVE
Urine Glucose: NEGATIVE
Urobilinogen, UA: 1 (ref 0.0–1.0)

## 2015-03-20 LAB — HEPATIC FUNCTION PANEL
ALBUMIN: 4.3 g/dL (ref 3.5–5.2)
ALT: 11 U/L (ref 0–35)
AST: 14 U/L (ref 0–37)
Alkaline Phosphatase: 70 U/L (ref 39–117)
BILIRUBIN TOTAL: 0.4 mg/dL (ref 0.2–1.2)
Bilirubin, Direct: 0.1 mg/dL (ref 0.0–0.3)
Total Protein: 7.1 g/dL (ref 6.0–8.3)

## 2015-03-20 LAB — VITAMIN B12: Vitamin B-12: 925 pg/mL — ABNORMAL HIGH (ref 211–911)

## 2015-03-20 LAB — TSH: TSH: 1.63 u[IU]/mL (ref 0.35–4.50)

## 2015-03-20 MED ORDER — CITALOPRAM HYDROBROMIDE 40 MG PO TABS: 40.0000 mg | ORAL_TABLET | Freq: Every day | ORAL | Status: DC

## 2015-03-20 MED ORDER — ARIPIPRAZOLE 2 MG PO TABS: 2.0000 mg | ORAL_TABLET | Freq: Every day | ORAL | Status: DC

## 2015-03-20 NOTE — Assessment & Plan Note (Signed)
Check B12 

## 2015-03-20 NOTE — Assessment & Plan Note (Addendum)
Chronic  9/16 worse. 5/16 son died Increase citalopram to 40 mg/d Added Abilify Labs Pt declined counseling  Pt will go

## 2015-03-20 NOTE — Assessment & Plan Note (Signed)
Resolved

## 2015-03-20 NOTE — Progress Notes (Signed)
Subjective:  Patient ID: [name], female    DOB: 1945/09/07  Age: 69 y.o. MRN: [id number]  CC: No chief complaint on file.   HPI ARONDA BURFORD presents for a worsening depression and stress. Mother is in a NH. Oldest son has died 2 mo ago. C/o marital stress. Sleeping a lot.    Outpatient Prescriptions Prior to Visit  Medication Sig Dispense Refill  . conjugated estrogens (PREMARIN) vaginal cream Place vaginally every 3 (three) days. pv 30 g 6  . pantoprazole (PROTONIX) 40 MG tablet Take 1 tablet (40 mg total) by mouth daily. 90 tablet 3  . Catheters (GENTLECATH URINARY CATHETER) MISC Use prn 2 each 1  . citalopram (CELEXA) 20 MG tablet Take 1 tablet (20 mg total) by mouth daily. 90 tablet 3  . diphenoxylate-atropine (LOMOTIL) 2.5-0.025 MG per tablet Take 1 tablet by mouth 2 (two) times daily. 30 tablet 3  . hydrocortisone (ANUSOL-HC) 2.5 % rectal cream Place 1 application rectally 2 (two) times daily. Needs applicator to place some cream inside (Patient not taking: Reported on 03/20/2015) 30 g 0  . loratadine (CLARITIN) 10 MG tablet Take 1 tablet (10 mg total) by mouth daily. 30 tablet 11  . amoxicillin (AMOXIL) 875 MG tablet Take 1 tablet (875 mg total) by mouth 2 (two) times daily. 20 tablet 0  . fluconazole (DIFLUCAN) 150 MG tablet Take 1 tablet (150 mg total) by mouth once. (Patient not taking: Reported on 03/20/2015) 1 tablet 1  . mirabegron ER (MYRBETRIQ) 50 MG TB24 tablet Take 1 tablet (50 mg total) by mouth daily. (Patient not taking: Reported on 03/20/2015) 30 tablet 11  . phenazopyridine (PYRIDIUM) 100 MG tablet Take 1 tablet (100 mg total) by mouth 3 (three) times daily as needed for pain. (Patient not taking: Reported on 03/20/2015) 30 tablet 0  . zaleplon (SONATA) 5 MG capsule Take 1 capsule (5 mg total) by mouth at bedtime as needed for sleep. (Patient not taking: Reported on 10/11/2014) 30 capsule 3   No facility-administered medications prior to visit.     ROS Review of Systems  Constitutional: Negative for chills, activity change, appetite change, fatigue and unexpected weight change.  HENT: Negative for congestion, mouth sores and sinus pressure.   Eyes: Negative for visual disturbance.  Respiratory: Negative for cough and chest tightness.   Gastrointestinal: Negative for nausea and abdominal pain.  Genitourinary: Negative for frequency, difficulty urinating and vaginal pain.  Musculoskeletal: Negative for back pain and gait problem.  Skin: Negative for pallor and rash.  Neurological: Negative for dizziness, tremors, weakness, numbness and headaches.  Hematological: Does not bruise/bleed easily.  Psychiatric/Behavioral: Positive for sleep disturbance and decreased concentration. Negative for suicidal ideas, behavioral problems, confusion, self-injury and agitation. The patient is nervous/anxious.     Objective:  BP 108/72 mmHg  Pulse 80  Wt 163 lb (73.936 kg)  SpO2 97%  BP Readings from Last 3 Encounters:  03/20/15 108/72  10/11/14 130/84  09/06/14 128/82    Wt Readings from Last 3 Encounters:  03/20/15 163 lb (73.936 kg)  10/11/14 163 lb (73.936 kg)  09/06/14 159 lb (72.122 kg)    Physical Exam  Constitutional: She appears well-developed. No distress.  HENT:  Head: Normocephalic.  Right Ear: External ear normal.  Left Ear: External ear normal.  Nose: Nose normal.  Mouth/Throat: Oropharynx is clear and moist.  Eyes: Conjunctivae are normal. Pupils are equal, round, and reactive to light. Right eye exhibits no discharge. Left eye exhibits no  discharge.  Neck: Normal range of motion. Neck supple. No JVD present. No tracheal deviation present. No thyromegaly present.  Cardiovascular: Normal rate, regular rhythm and normal heart sounds.   Pulmonary/Chest: No stridor. No respiratory distress. She has no wheezes.  Abdominal: Soft. Bowel sounds are normal. She exhibits no distension and no mass. There is no tenderness.  There is no rebound and no guarding.  Musculoskeletal: She exhibits no edema or tenderness.  Lymphadenopathy:    She has no cervical adenopathy.  Neurological: She displays normal reflexes. No cranial nerve deficit. She exhibits normal muscle tone. Coordination normal.  Skin: No rash noted. No erythema.  Psychiatric: Her behavior is normal. Judgment and thought content normal.    Lab Results  Component Value Date   WBC 7.8 03/20/2015   HGB 14.0 03/20/2015   HCT 42.0 03/20/2015   PLT 301.0 03/20/2015   GLUCOSE 118* 03/20/2015   CHOL 196 08/29/2014   TRIG 263.0* 08/29/2014   HDL 60.70 08/29/2014   LDLDIRECT 109.0 08/29/2014   ALT 11 03/20/2015   AST 14 03/20/2015   NA 141 03/20/2015   K 4.3 03/20/2015   CL 104 03/20/2015   CREATININE 0.84 03/20/2015   BUN 13 03/20/2015   CO2 30 03/20/2015   TSH 1.63 03/20/2015    Dg Ankle Complete Left  07/28/2012   *RADIOLOGY REPORT*  Clinical Data: Medial left ankle pain since an injury 3 weeks ago.  LEFT ANKLE COMPLETE - 3+ VIEW  Comparison: None.  Findings: There is no fracture, dislocation, or other significant osseous abnormality.  There is a small ankle effusion.  IMPRESSION: Small ankle effusion.   Original Report Authenticated By: Lorriane Shire, M.D.    Assessment & Plan:   Diagnoses and all orders for this visit:  B12 deficiency -     TSH; Future -     Vitamin B12; Future -     Basic metabolic panel; Future -     CBC with Differential/Platelet; Future -     Hepatic function panel; Future  Adjustment disorder with mixed anxiety and depressed mood -     TSH; Future -     Vitamin B12; Future -     Basic metabolic panel; Future -     CBC with Differential/Platelet; Future -     Hepatic function panel; Future  Need for influenza vaccination -     Flu Vaccine QUAD 36+ mos IM  Insomnia  Other orders -     Discontinue: citalopram (CELEXA) 40 MG tablet; Take 1 tablet (40 mg total) by mouth daily. -     ARIPiprazole (ABILIFY)  2 MG tablet; Take 1 tablet (2 mg total) by mouth daily. -     citalopram (CELEXA) 40 MG tablet; Take 1 tablet (40 mg total) by mouth daily.   I have discontinued Ms. Knaus's diphenoxylate-atropine, zaleplon, GENTLECATH URINARY CATHETER, amoxicillin, fluconazole, mirabegron ER, and phenazopyridine. I am also having her start on ARIPiprazole. Additionally, I am having her maintain her loratadine, hydrocortisone, pantoprazole, conjugated estrogens, and citalopram.  Meds ordered this encounter  Medications  . DISCONTD: citalopram (CELEXA) 40 MG tablet    Sig: Take 1 tablet (40 mg total) by mouth daily.    Dispense:  30 tablet    Refill:  3  . ARIPiprazole (ABILIFY) 2 MG tablet    Sig: Take 1 tablet (2 mg total) by mouth daily.    Dispense:  30 tablet    Refill:  3  . citalopram (CELEXA) 40 MG tablet  Sig: Take 1 tablet (40 mg total) by mouth daily.    Dispense:  90 tablet    Refill:  3     Follow-up: Return in about 4 weeks (around 04/17/2015) for a follow-up visit.  Walker Kehr, MD

## 2015-03-20 NOTE — Progress Notes (Signed)
Pre visit review using our clinic review tool, if applicable. No additional management support is needed unless otherwise documented below in the visit note. 

## 2015-03-22 ENCOUNTER — Telehealth: Payer: Self-pay | Admitting: *Deleted

## 2015-03-22 NOTE — Telephone Encounter (Signed)
Pt left msg on triage Thursday afternoon stating the med (Abilify) that md rx is too expensive copay is $138. Requesting alternative...[name]

## 2015-03-22 NOTE — Telephone Encounter (Signed)
For now try Citalopram 40 mg/d. Other options are $$ too. Keep ROV Thx

## 2015-03-22 NOTE — Telephone Encounter (Signed)
Notified pt with md response. Rx was sent to [facility]...[name]

## 2015-03-23 LAB — CULTURE, URINE COMPREHENSIVE

## 2015-03-28 ENCOUNTER — Encounter: Payer: Self-pay | Admitting: Internal Medicine

## 2015-03-28 ENCOUNTER — Ambulatory Visit (INDEPENDENT_AMBULATORY_CARE_PROVIDER_SITE_OTHER): Payer: Commercial Managed Care - HMO | Admitting: Internal Medicine

## 2015-03-28 VITALS — BP 120/70 | HR 72 | Ht 64.0 in | Wt 163.6 lb

## 2015-03-28 DIAGNOSIS — K219 Gastro-esophageal reflux disease without esophagitis: Secondary | ICD-10-CM

## 2015-03-28 DIAGNOSIS — Z800 Family history of malignant neoplasm of digestive organs: Secondary | ICD-10-CM

## 2015-03-28 DIAGNOSIS — K589 Irritable bowel syndrome without diarrhea: Secondary | ICD-10-CM | POA: Diagnosis not present

## 2015-03-28 MED ORDER — NA SULFATE-K SULFATE-MG SULF 17.5-3.13-1.6 GM/177ML PO SOLN: 1.0000 | Freq: Once | ORAL | Status: DC

## 2015-03-28 MED ORDER — PANTOPRAZOLE SODIUM 40 MG PO TBEC: 40.0000 mg | DELAYED_RELEASE_TABLET | Freq: Two times a day (BID) | ORAL | Status: DC

## 2015-03-28 NOTE — Patient Instructions (Signed)
We have sent the following medications to your pharmacy for you to pick up at your convenience:  protonix  You have been scheduled for a colonoscopy. Please follow written instructions given to you at your visit today.  Please pick up your prep supplies at the pharmacy within the next 1-3 days. If you use inhalers (even only as needed), please bring them with you on the day of your procedure. Your physician has requested that you go to www.startemmi.com and enter the access code given to you at your visit today. This web site gives a general overview about your procedure. However, you should still follow specific instructions given to you by our office regarding your preparation for the procedure.

## 2015-03-28 NOTE — Progress Notes (Signed)
HISTORY OF PRESENT ILLNESS:  [name] is a 69 y.o. female with multiple medical problems as listed below who presents today with a chief complaint of worsening reflux disease over the past year. As well, need for colonoscopy. The patient was last evaluated in this office 04/27/2014 by the GI nurse practitioner for hemorrhoids and possible vaginal [place] infection. For a patient of Dr. Sharlett Iles. My first visit with the patient today. Patient has a long-standing history of reflux disease for which she has been on PPI. Most recent upper endoscopy with Dr. Sharlett Iles 02/05/2010 revealing hiatal hernia. The patient has been on pantoprazole 40 mg daily. Previously had been on Dexilant milligrams daily with good control. However, the medication was cost prohibitive. Current symptoms have been worse over the past year. She describes bubbling or regurgitation with significant pyrosis. No dysphagia. She has had 10 pound weight gain over the past year. She will take an extra pantoprazole or antiacids for breakthrough symptoms. She does prop up on pillows at night. Symptoms are most prominent later in the day and at night. Symptoms can be exacerbated by late meals. Next, she reports a history of colon cancer in her grandfather and father. Her last colonoscopy was performed February 2011. Examination revealed severe left colon diverticulosis and hypertrophic anal papilla. No other abnormalities. She does have a history of chronic diarrhea. This continues intermittently. She has been diagnosed with IBS. Prior colon biopsies for Clostridium difficile have been negative. As well, negative duodenal biopsies. She is interested in her surveillance colonoscopy.  REVIEW OF SYSTEMS:  All non-GI ROS negative except for anxiety, back pain, cough, depression, fatigue, itching, night sweats, insomnia, shortness of breath  Past Medical History  Diagnosis Date  . Hiatal hernia   . Esophageal reflux   . Diverticulosis of  colon (without mention of hemorrhage)   . Irritable bowel syndrome   . Anal fissure   . Incontinence of feces   . Degeneration of intervertebral disc, site unspecified   . Depressive disorder, not elsewhere classified   . Other and unspecified ovarian cyst   . Anxiety   . Depression   . Blind loop syndrome   . Other B-complex deficiencies   . History of colon polyps 08/24/2011    hyperplastic  . Family history of malignant neoplasm of gastrointestinal tract   . Anemia, unspecified   . Internal hemorrhoids     Past Surgical History  Procedure Laterality Date  . Cholecystectomy  2003  . Abdominal hysterectomy  1972  . Cesarean section  1971  . Bladder suspension  [phone]  . Breast enhancement surgery  1980  . Abdominoplasty  1978    Social History DARNITA WOODRUM  reports that she has never smoked. She has never used smokeless tobacco. She reports that she drinks alcohol. She reports that she does not use illicit drugs.  family history includes Colon cancer in her paternal grandfather; Coronary artery disease in her father; Diabetes in her father; Hypertension in her father; Lung cancer in her paternal uncle.  Allergies  Allergen Reactions  . Bupropion Hcl     REACTION: head buzzing  . Fluoxetine Hcl     REACTION: anxious  . Latex   . Propoxyphene N-Acetaminophen   . Venlafaxine     REACTION: anxious  . Nickel Rash       PHYSICAL EXAMINATION: Vital signs: BP 120/70 mmHg  Pulse 72  Ht 5\' 4"  (1.626 m)  Wt 163 lb 9.6 oz (74.208 kg)  BMI  28.07 kg/m2  Constitutional: Out of shape and overweight appearing but generally well-appearing otherwise, no acute distress Psychiatric: alert and oriented x3, cooperative Eyes: extraocular movements intact, anicteric, conjunctiva pink Mouth: oral pharynx moist, no lesions Neck: supple without thyromegaly Lymph: no lymphadenopathy Cardiovascular: heart regular rate and rhythm, no murmur Lungs: clear to auscultation  bilaterally Abdomen: soft, obese, deferred until upcoming colonoscopy nontender, nondistended, no obvious ascites, no peritoneal signs, normal bowel sounds, no organomegaly Rectal: Extremities: no clubbing cyanosis or lower extremity edema bilaterally Skin: no lesions on visible extremities Neuro: No focal deficits. Cranial nerves intact   ASSESSMENT:  #1. GERD. Deteriorated. Breakthrough symptoms on once daily pantoprazole. Suspect exacerbation related to weight gain #2. History of IBS. Ongoing with intermittent diarrhea. No worse. #3. Family history of colon cancer in grandfather and father. Last colonoscopy 2011. Due for follow-up  PLAN:  #1. Reflux precautions. I have reviewed these with the patient. Emphasis has been placed on weight loss primarily. In addition avoiding large or late night meals and elevation of the head of the bed #2. Reflux precautions brochure provided as well #3. Change pantoprazole prescription to 40 mg twice daily. Prescribed #4. Trial of probiotic for IBS #5. Surveillance colonoscopy.The nature of the procedure, as well as the risks, benefits, and alternatives were carefully and thoroughly reviewed with the patient. Ample time for discussion and questions allowed. The patient understood, was satisfied, and agreed to proceed.  40 minutes was spent with this patient's evaluation. Greater than 50% of the time use for counseling regarding her areas GI diagnoses, the treatment, and follow-up plans. Multiple questions answered to her satisfaction

## 2015-04-22 ENCOUNTER — Ambulatory Visit (INDEPENDENT_AMBULATORY_CARE_PROVIDER_SITE_OTHER): Payer: Commercial Managed Care - HMO | Admitting: Internal Medicine

## 2015-04-22 ENCOUNTER — Encounter: Payer: Self-pay | Admitting: Internal Medicine

## 2015-04-22 VITALS — BP 112/72 | HR 72 | Wt 161.0 lb

## 2015-04-22 DIAGNOSIS — E538 Deficiency of other specified B group vitamins: Secondary | ICD-10-CM

## 2015-04-22 DIAGNOSIS — F4323 Adjustment disorder with mixed anxiety and depressed mood: Secondary | ICD-10-CM

## 2015-04-22 DIAGNOSIS — Z23 Encounter for immunization: Secondary | ICD-10-CM | POA: Diagnosis not present

## 2015-04-22 DIAGNOSIS — G4700 Insomnia, unspecified: Secondary | ICD-10-CM

## 2015-04-22 DIAGNOSIS — R531 Weakness: Secondary | ICD-10-CM | POA: Diagnosis not present

## 2015-04-22 NOTE — Patient Instructions (Signed)
Chair yoga or yoga - Christiana Fuchs

## 2015-04-22 NOTE — Assessment & Plan Note (Signed)
10/16 better on Celexa 40 mg/d

## 2015-04-22 NOTE — Progress Notes (Signed)
Subjective:  Patient ID: [name], female    DOB: 03/10/1946  Age: 69 y.o. MRN: [id number]  CC: No chief complaint on file.   HPI [name] presents for depression. Feeling better  Outpatient Prescriptions Prior to Visit  Medication Sig Dispense Refill  . citalopram (CELEXA) 40 MG tablet Take 1 tablet (40 mg total) by mouth daily. 90 tablet 3  . conjugated estrogens (PREMARIN) vaginal cream Place vaginally every 3 (three) days. pv 30 g 6  . hydrocortisone (ANUSOL-HC) 2.5 % rectal cream Place 1 application rectally 2 (two) times daily. Needs applicator to place some cream inside 30 g 0  . Na Sulfate-K Sulfate-Mg Sulf SOLN Take 1 kit by mouth once. 354 mL 0  . pantoprazole (PROTONIX) 40 MG tablet Take 1 tablet (40 mg total) by mouth 2 (two) times daily. 180 tablet 3  . Probiotic Product (PHILLIPS COLON HEALTH PO) Take 1 capsule by mouth daily.    Marland Kitchen loratadine (CLARITIN) 10 MG tablet Take 1 tablet (10 mg total) by mouth daily. 30 tablet 11   No facility-administered medications prior to visit.    ROS Review of Systems  Constitutional: Negative for chills, activity change, appetite change, fatigue and unexpected weight change.  HENT: Negative for congestion, mouth sores and sinus pressure.   Eyes: Negative for visual disturbance.  Respiratory: Negative for cough and chest tightness.   Gastrointestinal: Negative for nausea and abdominal pain.  Genitourinary: Negative for frequency, difficulty urinating and vaginal pain.  Musculoskeletal: Negative for back pain and gait problem.  Skin: Negative for pallor and rash.  Neurological: Negative for dizziness, tremors, weakness, numbness and headaches.  Psychiatric/Behavioral: Negative for suicidal ideas, confusion, sleep disturbance, dysphoric mood and decreased concentration. The patient is not nervous/anxious.     Objective:  BP 112/72 mmHg  Pulse 72  Wt 161 lb (73.029 kg)  SpO2 96%  BP Readings from Last 3  Encounters:  04/22/15 112/72  03/28/15 120/70  03/20/15 108/72    Wt Readings from Last 3 Encounters:  04/22/15 161 lb (73.029 kg)  03/28/15 163 lb 9.6 oz (74.208 kg)  03/20/15 163 lb (73.936 kg)    Physical Exam  Constitutional: She appears well-developed. No distress.  HENT:  Head: Normocephalic.  Right Ear: External ear normal.  Left Ear: External ear normal.  Nose: Nose normal.  Mouth/Throat: Oropharynx is clear and moist.  Eyes: Conjunctivae are normal. Pupils are equal, round, and reactive to light. Right eye exhibits no discharge. Left eye exhibits no discharge.  Neck: Normal range of motion. Neck supple. No JVD present. No tracheal deviation present. No thyromegaly present.  Cardiovascular: Normal rate, regular rhythm and normal heart sounds.   Pulmonary/Chest: No stridor. No respiratory distress. She has no wheezes.  Abdominal: Soft. Bowel sounds are normal. She exhibits no distension and no mass. There is no tenderness. There is no rebound and no guarding.  Musculoskeletal: She exhibits no edema or tenderness.  Lymphadenopathy:    She has no cervical adenopathy.  Neurological: She displays normal reflexes. No cranial nerve deficit. She exhibits normal muscle tone. Coordination normal.  Skin: No rash noted. No erythema.  Psychiatric: She has a normal mood and affect. Her behavior is normal. Judgment and thought content normal.    Lab Results  Component Value Date   WBC 7.8 03/20/2015   HGB 14.0 03/20/2015   HCT 42.0 03/20/2015   PLT 301.0 03/20/2015   GLUCOSE 118* 03/20/2015   CHOL 196 08/29/2014   TRIG 263.0*  08/29/2014   HDL 60.70 08/29/2014   LDLDIRECT 109.0 08/29/2014   ALT 11 03/20/2015   AST 14 03/20/2015   NA 141 03/20/2015   K 4.3 03/20/2015   CL 104 03/20/2015   CREATININE 0.84 03/20/2015   BUN 13 03/20/2015   CO2 30 03/20/2015   TSH 1.63 03/20/2015    Dg Ankle Complete Left  07/28/2012  *RADIOLOGY REPORT* Clinical Data: Medial left ankle pain  since an injury 3 weeks ago. LEFT ANKLE COMPLETE - 3+ VIEW Comparison: None. Findings: There is no fracture, dislocation, or other significant osseous abnormality.  There is a small ankle effusion. IMPRESSION: Small ankle effusion. Original Report Authenticated By: Lorriane Shire, M.D.    Assessment & Plan:   Diagnoses and all orders for this visit:  B12 deficiency  Insomnia  Adjustment disorder with mixed anxiety and depressed mood  Asthenia  Need for TD vaccine -     Td vaccine greater than or equal to 7yo preservative free IM  I am having [name] maintain her loratadine, hydrocortisone, conjugated estrogens, citalopram, Probiotic Product ([place]), Na Sulfate-K Sulfate-Mg Sulf, and pantoprazole.  No orders of the defined types were placed in this encounter.     Follow-up: Return in about 3 months (around 07/23/2015) for a follow-up visit.  Walker Kehr, MD

## 2015-04-22 NOTE — Assessment & Plan Note (Signed)
Better  

## 2015-04-22 NOTE — Progress Notes (Signed)
Pre visit review using our clinic review tool, if applicable. No additional management support is needed unless otherwise documented below in the visit note. 

## 2015-04-22 NOTE — Assessment & Plan Note (Signed)
Better on Celexa

## 2015-04-22 NOTE — Assessment & Plan Note (Signed)
On B12 

## 2015-04-23 DIAGNOSIS — K648 Other hemorrhoids: Secondary | ICD-10-CM

## 2015-04-23 HISTORY — DX: Other hemorrhoids: K64.8

## 2015-05-23 ENCOUNTER — Telehealth: Payer: Self-pay | Admitting: Internal Medicine

## 2015-05-23 NOTE — Telephone Encounter (Signed)
error 

## 2015-05-27 ENCOUNTER — Telehealth: Payer: Self-pay | Admitting: Internal Medicine

## 2015-05-27 NOTE — Telephone Encounter (Signed)
Spoke with pt and she states this morning she felt like she had to have a BM and she had diarrhea. States she had several diarrhea stools and the toilet was full of blood. Pt reports it was a dark purple maroon color. States she thinks it is easing off now. Pt instructed to go to the ER to be evaluated. Pt verbalized understanding.

## 2015-05-29 ENCOUNTER — Inpatient Hospital Stay (HOSPITAL_COMMUNITY): Payer: Commercial Managed Care - HMO | Admitting: Certified Registered"

## 2015-05-29 ENCOUNTER — Inpatient Hospital Stay (HOSPITAL_COMMUNITY)
Admission: EM | Admit: 2015-05-29 | Discharge: 2015-06-10 | DRG: 378 | Disposition: A | Discharge status: Home Health | Payer: Commercial Managed Care - HMO | Attending: Internal Medicine | Admitting: Internal Medicine

## 2015-05-29 ENCOUNTER — Encounter (HOSPITAL_COMMUNITY): Payer: Self-pay | Admitting: Emergency Medicine

## 2015-05-29 ENCOUNTER — Encounter (HOSPITAL_COMMUNITY)
Admission: EM | Disposition: A | Discharge status: Home Health | Payer: Self-pay | Source: Home / Self Care | Attending: Internal Medicine

## 2015-05-29 DIAGNOSIS — Z881 Allergy status to other antibiotic agents status: Secondary | ICD-10-CM | POA: Diagnosis not present

## 2015-05-29 DIAGNOSIS — K0381 Cracked tooth: Secondary | ICD-10-CM | POA: Diagnosis present

## 2015-05-29 DIAGNOSIS — Z800 Family history of malignant neoplasm of digestive organs: Secondary | ICD-10-CM | POA: Diagnosis not present

## 2015-05-29 DIAGNOSIS — Z91040 Latex allergy status: Secondary | ICD-10-CM

## 2015-05-29 DIAGNOSIS — K589 Irritable bowel syndrome without diarrhea: Secondary | ICD-10-CM | POA: Diagnosis present

## 2015-05-29 DIAGNOSIS — E876 Hypokalemia: Secondary | ICD-10-CM | POA: Diagnosis present

## 2015-05-29 DIAGNOSIS — Z888 Allergy status to other drugs, medicaments and biological substances status: Secondary | ICD-10-CM | POA: Diagnosis not present

## 2015-05-29 DIAGNOSIS — K449 Diaphragmatic hernia without obstruction or gangrene: Secondary | ICD-10-CM | POA: Diagnosis present

## 2015-05-29 DIAGNOSIS — K0889 Other specified disorders of teeth and supporting structures: Secondary | ICD-10-CM | POA: Diagnosis present

## 2015-05-29 DIAGNOSIS — K317 Polyp of stomach and duodenum: Secondary | ICD-10-CM

## 2015-05-29 DIAGNOSIS — Z86010 Personal history of colonic polyps: Secondary | ICD-10-CM | POA: Diagnosis not present

## 2015-05-29 DIAGNOSIS — K219 Gastro-esophageal reflux disease without esophagitis: Secondary | ICD-10-CM | POA: Diagnosis present

## 2015-05-29 DIAGNOSIS — F329 Major depressive disorder, single episode, unspecified: Secondary | ICD-10-CM | POA: Diagnosis present

## 2015-05-29 DIAGNOSIS — K921 Melena: Secondary | ICD-10-CM

## 2015-05-29 DIAGNOSIS — D62 Acute posthemorrhagic anemia: Secondary | ICD-10-CM | POA: Diagnosis present

## 2015-05-29 DIAGNOSIS — K5731 Diverticulosis of large intestine without perforation or abscess with bleeding: Secondary | ICD-10-CM | POA: Diagnosis present

## 2015-05-29 DIAGNOSIS — Z79899 Other long term (current) drug therapy: Secondary | ICD-10-CM

## 2015-05-29 DIAGNOSIS — K922 Gastrointestinal hemorrhage, unspecified: Secondary | ICD-10-CM

## 2015-05-29 DIAGNOSIS — F32A Depression, unspecified: Secondary | ICD-10-CM | POA: Diagnosis present

## 2015-05-29 DIAGNOSIS — F411 Generalized anxiety disorder: Secondary | ICD-10-CM | POA: Diagnosis present

## 2015-05-29 DIAGNOSIS — Z91048 Other nonmedicinal substance allergy status: Secondary | ICD-10-CM

## 2015-05-29 HISTORY — PX: ESOPHAGOGASTRODUODENOSCOPY: SHX5428

## 2015-05-29 HISTORY — DX: Gastro-esophageal reflux disease without esophagitis: K21.9

## 2015-05-29 HISTORY — DX: Other specified anxiety disorders: F41.8

## 2015-05-29 HISTORY — DX: Other specified diseases of intestine: K63.89

## 2015-05-29 LAB — HEMOGLOBIN AND HEMATOCRIT, BLOOD
HCT: 31.4 % — ABNORMAL LOW (ref 36.0–46.0)
HCT: 27.3 % — ABNORMAL LOW (ref 36.0–46.0)
HEMOGLOBIN: 10.5 g/dL — AB (ref 12.0–15.0)
Hemoglobin: 9.4 g/dL — ABNORMAL LOW (ref 12.0–15.0)

## 2015-05-29 LAB — CBC WITH DIFFERENTIAL/PLATELET
BASOS ABS: 0 10*3/uL (ref 0.0–0.1)
Basophils Relative: 1 %
EOS PCT: 1 %
Eosinophils Absolute: 0.1 10*3/uL (ref 0.0–0.7)
HCT: 31.5 % — ABNORMAL LOW (ref 36.0–46.0)
Hemoglobin: 10.7 g/dL — ABNORMAL LOW (ref 12.0–15.0)
LYMPHS PCT: 26 %
Lymphs Abs: 1.7 10*3/uL (ref 0.7–4.0)
MCH: 30.7 pg (ref 26.0–34.0)
MCHC: 34 g/dL (ref 30.0–36.0)
MCV: 90.5 fL (ref 78.0–100.0)
MONO ABS: 0.3 10*3/uL (ref 0.1–1.0)
Monocytes Relative: 5 %
Neutro Abs: 4.4 10*3/uL (ref 1.7–7.7)
Neutrophils Relative %: 67 %
PLATELETS: 218 10*3/uL (ref 150–400)
RBC: 3.48 MIL/uL — ABNORMAL LOW (ref 3.87–5.11)
RDW: 13.4 % (ref 11.5–15.5)
WBC: 6.6 10*3/uL (ref 4.0–10.5)

## 2015-05-29 LAB — CBC
HEMATOCRIT: 24.6 % — AB (ref 36.0–46.0)
HEMOGLOBIN: 8.4 g/dL — AB (ref 12.0–15.0)
MCH: 30.2 pg (ref 26.0–34.0)
MCHC: 34.1 g/dL (ref 30.0–36.0)
MCV: 88.5 fL (ref 78.0–100.0)
Platelets: 144 10*3/uL — ABNORMAL LOW (ref 150–400)
RBC: 2.78 MIL/uL — AB (ref 3.87–5.11)
RDW: 14.4 % (ref 11.5–15.5)
WBC: 8.2 10*3/uL (ref 4.0–10.5)

## 2015-05-29 LAB — BASIC METABOLIC PANEL
ANION GAP: 10 (ref 5–15)
BUN: 31 mg/dL — AB (ref 6–20)
CALCIUM: 8.8 mg/dL — AB (ref 8.9–10.3)
CO2: 19 mmol/L — ABNORMAL LOW (ref 22–32)
CREATININE: 0.93 mg/dL (ref 0.44–1.00)
Chloride: 108 mmol/L (ref 101–111)
GFR calc Af Amer: >60 mL/min (ref >60)
GLUCOSE: 127 mg/dL — AB (ref 65–99)
Potassium: 3.6 mmol/L (ref 3.5–5.1)
Sodium: 137 mmol/L (ref 135–145)

## 2015-05-29 LAB — PROTIME-INR
INR: 1.34 (ref 0.00–1.49)
Prothrombin Time: 16.7 seconds — ABNORMAL HIGH (ref 11.6–15.2)

## 2015-05-29 LAB — PREPARE RBC (CROSSMATCH)

## 2015-05-29 LAB — ABO/RH: ABO/RH(D): A POS

## 2015-05-29 LAB — MRSA PCR SCREENING: MRSA by PCR: NEGATIVE

## 2015-05-29 LAB — POC OCCULT BLOOD, ED: Fecal Occult Bld: POSITIVE — AB

## 2015-05-29 SURGERY — EGD (ESOPHAGOGASTRODUODENOSCOPY)
Anesthesia: Monitor Anesthesia Care

## 2015-05-29 MED ORDER — SODIUM CHLORIDE 0.9 % IV BOLUS (SEPSIS)
1000.0000 mL | Freq: Once | INTRAVENOUS | Status: AC
  Administered 2015-05-29: 1000 mL via INTRAVENOUS

## 2015-05-29 MED ORDER — PANTOPRAZOLE SODIUM 40 MG IV SOLR
40.0000 mg | Freq: Once | INTRAVENOUS | Status: AC
  Administered 2015-05-29: 40 mg via INTRAVENOUS
  Filled 2015-05-29: qty 40

## 2015-05-29 MED ORDER — SODIUM CHLORIDE 0.9 % IV SOLN
10.0000 mL/h | Freq: Once | INTRAVENOUS | Status: AC
  Administered 2015-05-29: 10 mL/h via INTRAVENOUS

## 2015-05-29 MED ORDER — PEG-KCL-NACL-NASULF-NA ASC-C 100 G PO SOLR: 1.0000 | Freq: Once | ORAL | Status: DC

## 2015-05-29 MED ORDER — SODIUM CHLORIDE 0.9 % IV SOLN
INTRAVENOUS | Status: DC
  Administered 2015-05-29 – 2015-06-02 (×2): via INTRAVENOUS

## 2015-05-29 MED ORDER — SODIUM CHLORIDE 0.9 % IV SOLN
8.0000 mg/h | INTRAVENOUS | Status: DC
  Administered 2015-05-29: 8 mg/h via INTRAVENOUS
  Filled 2015-05-29 (×2): qty 80

## 2015-05-29 MED ORDER — PANTOPRAZOLE SODIUM 40 MG PO TBEC
40.0000 mg | DELAYED_RELEASE_TABLET | Freq: Two times a day (BID) | ORAL | Status: DC
  Administered 2015-05-29 – 2015-06-03 (×11): 40 mg via ORAL
  Filled 2015-05-29 (×10): qty 1

## 2015-05-29 MED ORDER — SODIUM CHLORIDE 0.9 % IV BOLUS (SEPSIS)
500.0000 mL | Freq: Once | INTRAVENOUS | Status: AC
  Administered 2015-05-29: 500 mL via INTRAVENOUS

## 2015-05-29 MED ORDER — LACTATED RINGERS IV SOLN
INTRAVENOUS | Status: DC
  Administered 2015-05-29: 1000 mL via INTRAVENOUS

## 2015-05-29 MED ORDER — PEG-KCL-NACL-NASULF-NA ASC-C 100 G PO SOLR
0.5000 | Freq: Once | ORAL | Status: AC
  Administered 2015-05-30: 100 g via ORAL
  Filled 2015-05-29: qty 1

## 2015-05-29 MED ORDER — SODIUM CHLORIDE 0.9 % IV SOLN
Freq: Once | INTRAVENOUS | Status: AC
  Administered 2015-05-30: 500 mL via INTRAVENOUS

## 2015-05-29 MED ORDER — PROPOFOL 10 MG/ML IV BOLUS
INTRAVENOUS | Status: DC | PRN
  Administered 2015-05-29 (×4): 20 mg via INTRAVENOUS

## 2015-05-29 MED ORDER — OXYCODONE HCL 5 MG PO TABS
5.0000 mg | ORAL_TABLET | ORAL | Status: DC | PRN
  Administered 2015-05-29 – 2015-05-31 (×5): 5 mg via ORAL
  Filled 2015-05-29 (×5): qty 1

## 2015-05-29 MED ORDER — SODIUM CHLORIDE 0.9 % IJ SOLN
3.0000 mL | Freq: Two times a day (BID) | INTRAMUSCULAR | Status: DC
Start: 2015-05-29 — End: 2015-05-31
  Administered 2015-05-29 – 2015-05-31 (×3): 3 mL via INTRAVENOUS

## 2015-05-29 MED ORDER — PROPOFOL 500 MG/50ML IV EMUL
INTRAVENOUS | Status: DC | PRN
  Administered 2015-05-29: 100 ug/kg/min via INTRAVENOUS

## 2015-05-29 MED ORDER — SODIUM CHLORIDE 0.9 % IV SOLN: INTRAVENOUS | Status: DC

## 2015-05-29 MED ORDER — MENTHOL 3 MG MT LOZG: 1.0000 | LOZENGE | OROMUCOSAL | Status: DC | PRN

## 2015-05-29 MED ORDER — CITALOPRAM HYDROBROMIDE 20 MG PO TABS
40.0000 mg | ORAL_TABLET | Freq: Every day | ORAL | Status: DC
  Administered 2015-05-30 – 2015-05-31 (×2): 40 mg via ORAL
  Filled 2015-05-29 (×2): qty 2

## 2015-05-29 MED ORDER — SODIUM CHLORIDE 0.9 % IV SOLN: Freq: Once | INTRAVENOUS | Status: DC

## 2015-05-29 MED ORDER — BUTAMBEN-TETRACAINE-BENZOCAINE 2-2-14 % EX AERO
INHALATION_SPRAY | CUTANEOUS | Status: DC | PRN
  Administered 2015-05-29: 2 via TOPICAL

## 2015-05-29 MED ORDER — PANTOPRAZOLE SODIUM 40 MG IV SOLR: 40.0000 mg | Freq: Two times a day (BID) | INTRAVENOUS | Status: DC

## 2015-05-29 MED ORDER — PEG-KCL-NACL-NASULF-NA ASC-C 100 G PO SOLR
0.5000 | Freq: Once | ORAL | Status: AC
  Administered 2015-05-29: 100 g via ORAL
  Filled 2015-05-29: qty 1

## 2015-05-29 NOTE — Anesthesia Preprocedure Evaluation (Addendum)
Anesthesia Evaluation  Patient identified by MRN, date of birth, ID band Patient awake    Reviewed: Allergy & Precautions, NPO status , Patient's Chart, lab work & pertinent test results  History of Anesthesia Complications Negative for: history of anesthetic complications  Airway Mallampati: II  TM Distance: >3 FB Neck ROM: Full  Mouth opening: Limited Mouth Opening  Dental  (+) Teeth Intact, Dental Advisory Given   Pulmonary    breath sounds clear to auscultation       Cardiovascular  Rhythm:Regular     Neuro/Psych  Headaches, Anxiety    GI/Hepatic GERD  Medicated and Poorly Controlled,  Endo/Other    Renal/GU      Musculoskeletal  (+) Arthritis ,   Abdominal   Peds  Hematology  (+) anemia ,   Anesthesia Other Findings   Reproductive/Obstetrics                            Anesthesia Physical Anesthesia Plan  ASA: II  Anesthesia Plan: MAC   Post-op Pain Management:    Induction: Intravenous  Airway Management Planned: Nasal Cannula  Additional Equipment: None  Intra-op Plan:   Post-operative Plan:   Informed Consent: I have reviewed the patients History and Physical, chart, labs and discussed the procedure including the risks, benefits and alternatives for the proposed anesthesia with the patient or authorized representative who has indicated his/her understanding and acceptance.   Dental advisory given  Plan Discussed with: CRNA and Anesthesiologist  Anesthesia Plan Comments:         Anesthesia Quick Evaluation

## 2015-05-29 NOTE — ED Notes (Signed)
Admitting MD at bedside.

## 2015-05-29 NOTE — Care Management Note (Addendum)
Case Management Note  Patient Details  Name: [name] MRN: [id number] Date of Birth: 24-Jun-1945  Subjective/Objective:                 Admitted with gib ,anemia. Independent with ADL's. No dme  usage.   Action/Plan: Return to home when medically stable. CM to f/u with disposition needs.     GI following -  EGD 05/29/15 Return to home when medically stable. CM to f/u with d/c needs.  Expected Discharge Date:                  Expected Discharge Plan:  Home/Self Care  In-House Referral:     Discharge planning Services  CM Consult  Post Acute Care Choice:    Choice offered to:     DME Arranged:    DME Agency:     HH Arranged:    HH Agency:     Status of Service:  In process, will continue to follow  Medicare Important Message Given:    Date Medicare IM Given:    Medicare IM give by:    Date Additional Medicare IM Given:    Additional Medicare Important Message give by:     If discussed at [place] of Stay Meetings, dates discussed:    Additional Comments:   [name] (Daughter)  [phone]  [address] [place], [place] [phone] 05/29/2015, 3:46 PM

## 2015-05-29 NOTE — Anesthesia Postprocedure Evaluation (Signed)
Anesthesia Post Note  Patient: [name]  Procedure(s) Performed: Procedure(s) (LRB): ESOPHAGOGASTRODUODENOSCOPY (EGD) (N/A)  Patient location during evaluation: Endoscopy Anesthesia Type: MAC Level of consciousness: awake Pain management: pain level controlled Vital Signs Assessment: post-procedure vital signs reviewed and stable Respiratory status: spontaneous breathing Cardiovascular status: stable Postop Assessment: no signs of nausea or vomiting Anesthetic complications: no    Last Vitals:  Filed Vitals:   05/29/15 1100 05/29/15 1110  BP: 116/55 104/61  Pulse: 87 86  Temp:    Resp: 17 17    Last Pain:  Filed Vitals:   05/29/15 1114  PainSc: 4                  [name]

## 2015-05-29 NOTE — ED Notes (Signed)
Blood infusion increased to 250 ml/hr. No distress or adverse reaction noted.

## 2015-05-29 NOTE — H&P (Signed)
History and Physical  Patient Name: [name]     [id number]    DOB: 1945-09-08    DOA: 05/29/2015 Referring physician: Thayer Jew, MD PCP: Walker Kehr, MD      Chief Complaint: Melena  HPI: [name] is a 70 y.o. female with a past medical history significant for anxiety who presents with melena for one week.  The patient has had worsening dental pain over the last month. This started with a broken tooth, for which she was hoping to get treatment in January when her supplemental insurance kicked in. In the meantime the patient was taking 2 tablets of ibuprofen or naproxen) she is not sure which) every 6 hours for about 3 weeks until last Friday when she had a bloody bowel movement.   She called her gastroenterologist who recommended follow-up appointment this week and also her son who is a GP who recommended she stop NSAIDs.  Unfortunately, after 2 days, she again had black sticky bowel movements several times yesterday and today. Today then she had a large frankly bloody bowel movement ("looked like a crime scene in there") and came to the ER.  In the ED, the patient was hemodynamically stable, had melena on exam that was hemoccult positive, and was noted to have hemoglobin 10.7 g/dL from previous 14 g/dL.  Pantoprazole bolus was administered and TR[facility] were asked to admit for suspected UGIB.  While in the ER, the patient had 2-3 large frankly bloody stools, and developed orthostatic symptoms with sitting up as well as BP 90/60 mmHg while standing.  Transfusion of PRBCs was administered as well as pantoprazole infusion and the patient was transferred to stepdown.       Review of Systems:  Pt complains of tooth pain, melena, hematochezia. Pt denies any abdominal pain, nausea, hematemesis.  All other systems negative except as just noted or noted in the history of present illness.   Allergies:  Allergies  Allergen Reactions  . Bupropion Hcl     REACTION:  head buzzing  . Fluoxetine Hcl     REACTION: anxious  . Latex   . Propoxyphene N-Acetaminophen   . Venlafaxine     REACTION: anxious  . Nickel Rash    Home medications: 1. Citalopram 40 mg daily 2. Pantoprazole 40 mg twice daily  Past medical history: 1. Anxiety 2. Diverticulosis 3. IBS No history of AAA  Past surgical history: 1. Cholecystectomy 2. Hysterectomy 3. C-section 4. Bladder suspension 5. Breast enhancement 6. Abdominoplasty  Family history:  Colon cancer in paternal GF.  Ruptured AAA in paternal GF. Lung cancer in paternal uncle. Coronary disease and DM in father.    Social History:  Patient is from [place] and [place] originally.  Non smoker.  Indepndent with all ADLs and ambulates without assistive device.         Physical Exam: BP 121/75 mmHg  Pulse 81  Temp(Src) 98.2 F (36.8 C) (Oral)  Resp 19  SpO2 99% General appearance: Well-developed, adult female, alert and in moderate distress from stress/anxiety.   Eyes: Anicteric, lids and lashes normal.     ENT: No nasal deformity, discharge, or epistaxis.  OP moist without lesions.   Skin: Warm and dry.  No pallor. Cardiac: RRR, nl S1-S2, no murmurs appreciated.  Respiratory: Normal respiratory rate and rhythm.  CTAB without rales or wheezes. Abdomen: Abdomen soft without rigidity.  No TTP at all. No ascites, distension.   Rectal: Frank blood in bed pan. Neuro: Sensorium intact and  responding to questions, attention normal.  Speech is fluent.  Moves all extremities equally and with normal coordination.    Psych: Behavior appropriate.  Affect tearful.  No evidence of aural or visual hallucinations or delusions.       Labs on Admission:  The metabolic panel shows normal sodium, potassium, renal function. Low bicarbonate, mildly. FOBT positive. The complete blood count shows anemia, hemoglobin 10.7 g/dL from 14 g/dL in September.     Assessment/Plan 1. UGIB:  This is new.  GI bleeding.   Report of melena and NSAID use suggests upper source.     -Consult to GI for urgent evaluation, appreciate cares -Pantoprazole gtt   2. Acute blood loss anemia:  This is new.   -Transfuse 1 unit now -Post-transfusion H/H  3. Depression/anxiety:  Continue home citalopram  4. Dental pain:  Hold NSAIDs, clearly Oxycodone PRN for pain Acetaminophen PRN for pain    DVT PPx: SCDs Diet: NPO for now Consultants: Gastroenterology Code Status: Full Family Communication: None present  Medical decision making: What exists of the patient's previous chart was reviewed in depth and the case was discussed with Dr. Dina Rich. Patient seen 3:53 AM on 05/29/2015.  Disposition Plan:  Admit to stepdown for acute bleeding.  Consutlation with GI.  Trend CBC and plan pending endoscopy or other recommended evaluation.      Edwin Dada [facility] Pager [phone]

## 2015-05-29 NOTE — H&P (View-Only) (Signed)
[facility] Consult: 9:44 AM 05/29/2015  LOS: 0 days    Referring Provider: Dr Thereasa Solo  Primary Care Physician:  Walker Kehr, MD Primary Gastroenterologist:  Formerly Dr. Sharlett Iles, now Dr Henrene Pastor, set up for surveillance colonoscopy 06/2015   Reason for Consultation:  GI bleed.  Melenic stool.     HPI: [name] is a 69 y.o. female.  Hx anxiety.  2003 cholecystectomy.  S/p 4 separate surgeries on her bladder.    Hx IBS-D for many years, though in recent years diarrhea has been intermittent.  Adenomatous colon polyp before 2002. GERD. Hx SB bacterial overgrowth.  04/30/2015 office Anoscopy.  For perianal itching.  Internal hemorrhoids.  08/2011 Flex Sig.  For unexplained diarrhea.  Mild diverticulosis.  Random biopsies with prominent lymphoid aggregate but otherwise normal.  07/2009 Colonoscopy.  For chronic diarrhea.  Severe sigmoid/descending diverticulosis, hypertrophied anal papillae (HP changes on path).  Random biopsies unremarkable.  01/2010 EGD. For unexplained abd pain.  HH, nodular fundal mucosa, otherwise normal. SB biopsies neg for celiac. Esophageal  Biopsy showed mild reflux changes, no Barretts 08/2005  Colonoscopy.  Diverticulosis. 08/2000  Colonoscopy.  Left sided tics.   Pt on BID PPI, probiotic at home.  Pt broke a tooth in 04/2015 and began use of Ibuprofen and/or Naprosyn for the last 3 weeks.  Noticed bloody BM 6 days ago, on Friday, at which point NSAID use ceased.  Stools turned melenic, black/maroon on 12/6. She continues to pass burgundy, malodorous liquid stools this AM in large quantity.  Rectal exam in ED confirmed melena.  BP 90/60.  She was started on PPI drip. .  Hgb 10.7, was 14 on 03/20/15.  Has received 1 PRBC so far.  BUN 31, was 13 on 03/20/15.  No abdominal pain.  Nausea  and dry heaving on Friday at start of bleeding but it resolved and has not recurred.  Mild dizziness, no chest pain, no palpitations, no dyspnea.    Past Medical History  Diagnosis Date  . GERD (gastroesophageal reflux disease) 2011    Hiatal hernia, nodular fundus on EGD  . Diverticulosis of colon (without mention of hemorrhage) 2002    left colon/sigmoid.   . Irritable bowel syndrome 2002    chronic diarrhea. biopsies neg for celiac, neg for microscopic/collagenous colitis   . Anal fissure   . Degeneration of intervertebral disc, site unspecified   . Other and unspecified ovarian cyst   . Depression with anxiety   . Bacterial overgrowth syndrome 2002  . Other B-complex deficiencies   . History of colon polyps 08/24/2011    hyperplastic 2013, adenomatous before 2002  . Anemia, unspecified   . Internal hemorrhoids 04/2015    pruritus ani.     Past Surgical History  Procedure Laterality Date  . Cholecystectomy  2003  . Abdominal hysterectomy  1972  . Cesarean section  1971  . Bladder suspension  [phone]  . Breast enhancement surgery  1980  . Abdominoplasty  1978    Prior to Admission medications   Medication Sig Start Date End  Date Taking? Authorizing Provider  citalopram (CELEXA) 40 MG tablet Take 1 tablet (40 mg total) by mouth daily. 03/20/15  Yes Aleksei Plotnikov V, MD  conjugated estrogens (PREMARIN) vaginal cream Place vaginally every 3 (three) days. pv 02/08/15  Yes Aleksei Plotnikov V, MD  pantoprazole (PROTONIX) 40 MG tablet Take 1 tablet (40 mg total) by mouth 2 (two) times daily. 03/28/15  Yes Irene Shipper, MD  Probiotic Product ([place] PO) Take 1 capsule by mouth daily.   Yes Historical Provider, MD  loratadine (CLARITIN) 10 MG tablet Take 1 tablet (10 mg total) by mouth daily. Patient not taking: Reported on 05/29/2015 02/29/12 02/28/13  Rowe Clack, MD    Scheduled Meds: . sodium chloride   Intravenous Once  . citalopram  40 mg Oral Daily   . sodium chloride  500 mL Intravenous Once  . sodium chloride  3 mL Intravenous Q12H   Infusions: . sodium chloride    . pantoprozole (PROTONIX) infusion 8 mg/hr (05/29/15 0800)   PRN Meds: oxyCODONE   Allergies as of 05/29/2015 - Review Complete 05/29/2015  Allergen Reaction Noted  . Bupropion hcl  05/23/2010  . Fluoxetine hcl  02/26/2009  . Latex  02/05/2010  . Propoxyphene n-acetaminophen    . Venlafaxine  02/26/2009  . Nickel Rash 03/28/2015    Family History  Problem Relation Age of Onset  . Hypertension Father   . Coronary artery disease Father   . Colon cancer Paternal Grandfather     Paternal greatfather  . Lung cancer Paternal Uncle   . Diabetes Father     Social History   Social History  . Marital Status: Divorced    Spouse Name: N/A  . Number of Children: 3  . Years of Education: N/A   Occupational History  . retired    Social History Main Topics  . Smoking status: Never Smoker   . Smokeless tobacco: Never Used  . Alcohol Use: 0.0 oz/week    0 Standard drinks or equivalent per week  . Drug Use: No  . Sexual Activity: Not on file   Other Topics Concern  . Not on file   Social History Narrative    REVIEW OF SYSTEMS: Constitutional:  Generally good energy level.  Weight stable ENT:  No nose bleeds Pulm:  No SOB or cough CV:  No palpitations, no LE edema.  GU:  Unable to urinate in bed pan.  Bladder feels full, uncomfortable.  Getting hypotensive when she gets up onto commode.  GI:  Per HPI Heme:  No unusual bleeding until last week, no  Excessive bruising   Transfusions:  None in past Neuro:  No headaches, no peripheral tingling or numbness Derm:  No itching, no rash or sores.  Endocrine:  No sweats or chills.  No polyuria or dysuria Immunization:  Reviewed.  fluvax up to date.  Travel:  None beyond local counties in last few months.    PHYSICAL EXAM: Vital signs in last 24 hours: Filed Vitals:   05/29/15 0900 05/29/15 0917  BP:  118/71 108/69  Pulse: 104 90  Temp:    Resp: 26 19   Wt Readings from Last 3 Encounters:  04/22/15 73.029 kg (161 lb)  03/28/15 74.208 kg (163 lb 9.6 oz)  03/20/15 73.936 kg (163 lb)   General: pleasant, comfortable, pale but overall looks well.  Head:  No asymmetry or swelling  Eyes:  No icterus or pallor Ears:  Not HOH  Nose:  No congestion  or discharge Mouth:  Clear bil, no labored breathing Neck:  No mass, no JVD, no TMG Lungs:  Clear bil.  No labored breathing or cough.  Heart: RRR.  No MRG.   Abdomen:  Soft, NT, ND.  Active BS.  No hernia  Rectal: large amount melenic stool   Musc/Skeltl: no joint swelling, redness or deformities Extremities:  No CCE  Neurologic:  Oriented x 3.  No limb weakness, no tremor.  No gross deficits Skin:  + pallor, no telangectasia, rash, sores Tattoos:  none Nodes:  No cervical adenopathy.    Psych:  Pleasant, cooperative, calm.   Intake/Output from previous day: 12/06 0701 - 12/07 0700 In: 335 [Blood:335] Out: -  Intake/Output this shift:    LAB RESULTS:  Recent Labs  05/29/15 0100 05/29/15 0325  WBC 6.6  --   HGB 10.7* 10.5*  HCT 31.5* 31.4*  PLT 218  --    BMET Lab Results  Component Value Date   NA 137 05/29/2015   NA 141 03/20/2015   NA 137 08/29/2014   K 3.6 05/29/2015   K 4.3 03/20/2015   K 4.6 08/29/2014   CL 108 05/29/2015   CL 104 03/20/2015   CL 105 08/29/2014   CO2 19* 05/29/2015   CO2 30 03/20/2015   CO2 28 08/29/2014   GLUCOSE 127* 05/29/2015   GLUCOSE 118* 03/20/2015   GLUCOSE 90 08/29/2014   BUN 31* 05/29/2015   BUN 13 03/20/2015   BUN 20 08/29/2014   CREATININE 0.93 05/29/2015   CREATININE 0.84 03/20/2015   CREATININE 0.79 08/29/2014   CALCIUM 8.8* 05/29/2015   CALCIUM 9.2 03/20/2015   CALCIUM 9.4 08/29/2014    Lipase     Component Value Date/Time   LIPASE 36.0 02/04/2010 1102    Drugs of Abuse  No results found for: LABOPIA, COCAINSCRNUR, LABBENZ, AMPHETMU, THCU, LABBARB    RADIOLOGY STUDIES: No results found.  ENDOSCOPIC STUDIES: Per HPI  IMPRESSION:   *  GI bleed, UGI source suspected.  Rule out NSAID induced ulcer.   *  ABL anemia. Normocytic.  S/p PRBC x 1.    *  IBS-D.  In recent years has calmed down  *  S/p multiple bladder surgeries, currently difficulty voiding.    PLAN:     *  EGD this AM    *  Hgb now, in 8 hours, CBC in AM.  coags now.   *  Place foley.   Azucena Freed  05/29/2015, 9:44 AM Pager: [phone]      Attending physician's note   I have taken a history, examined the patient and reviewed the chart. I agree with the Advanced Practitioner's note, impression and recommendations. Acute GI bleed with melena, very likely UGI source. R/O NSAID ulcer. IVFs and transfuse for Hb < 8. IV PPI infusion. Avoid NSAIDs. EGD today. The risks (including bleeding, perforation, infection, missed lesions, medication reactions and possible hospitalization or surgery if complications occur), benefits, and alternatives to endoscopy with possible biopsy and possible dilation were discussed with the patient and they consent to proceed.    Lucio Edward, MD [facility] [phone] Mon-Fri 8a-5p [phone] after 5p, weekends, holidays

## 2015-05-29 NOTE — Progress Notes (Signed)
[facility] TEAM 1 - Stepdown/ICU TEAM PROGRESS NOTE  [name] [id number] DOB: February 07, 1946 DOA: 05/29/2015 PCP: Walker Kehr, MD  Admit HPI / Brief Narrative: 69 y.o. female with a history of anxiety who presented with melena for one week.  The patient had worsening dental pain over the preceding month for which she was taking 2 tablets of ibuprofen every 6 hours until she had a bloody bowel movement.  She called her gastroenterologist who recommended a follow-up appointment this week, and also her son (a physician) who recommended she stop NSAIDs. Despite following this advice she again had black sticky bowel movements several times. The day of her presentation she had a large frankly bloody bowel movement ("looked like a crime scene in there") and came to the ER.  In the ED, the patient was hemodynamically stable, had melena on exam that was hemoccult positive, and was noted to have hemoglobin 10.7 g/dL from previous 14 g/dL. While in the ER, the patient had 2-3 large frankly bloody stools, and developed orthostatic symptoms with sitting up as well as BP 90/60 mmHg while standing. Transfusion of PRBCs was administered as well as pantoprazole infusion and the patient was transferred to stepdown.   HPI/Subjective: Pt seen for f/u visit.  Assessment/Plan:  UGIB  GI has been consulted - suspect UGI source - protonix gtt   Acute blood loss anemia   Depression/anxiety Continue home citalopram  Dental pain   Code Status: FULL Family Communication: no family present at time of exam Disposition Plan: SDU  Consultants: [facility]  Procedures: none  Antibiotics: none  DVT prophylaxis: SCDs  Objective: Blood pressure 120/71, pulse 72, temperature 98.2 F (36.8 C), temperature source Oral, resp. rate 15, SpO2 100 %.  Intake/Output Summary (Last 24 hours) at 05/29/15 0820 Last data filed at 05/29/15 0656  Gross per 24 hour  Intake    335 ml  Output      0 ml    Net    335 ml    Exam: No exam today.  Data Reviewed: Basic Metabolic Panel:  Recent Labs Lab 05/29/15 0100  NA 137  K 3.6  CL 108  CO2 19*  GLUCOSE 127*  BUN 31*  CREATININE 0.93  CALCIUM 8.8*    CBC:  Recent Labs Lab 05/29/15 0100 05/29/15 0325  WBC 6.6  --   NEUTROABS 4.4  --   HGB 10.7* 10.5*  HCT 31.5* 31.4*  MCV 90.5  --   PLT 218  --     Liver Function Tests: No results for input(s): AST, ALT, ALKPHOS, BILITOT, PROT, ALBUMIN in the last 168 hours. No results for input(s): LIPASE, AMYLASE in the last 168 hours. No results for input(s): AMMONIA in the last 168 hours.  Coags: No results for input(s): INR in the last 168 hours.  Invalid input(s): PT No results for input(s): APTT in the last 168 hours.  Cardiac Enzymes: No results for input(s): CKTOTAL, CKMB, CKMBINDEX, TROPONINI in the last 168 hours.  CBG: No results for input(s): GLUCAP in the last 168 hours.  No results found for this or any previous visit (from the past 240 hour(s)).   Studies:   Recent x-ray studies have been reviewed in detail by the Attending Physician  Scheduled Meds:  Scheduled Meds: . sodium chloride   Intravenous Once  . citalopram  40 mg Oral Daily  . sodium chloride  3 mL Intravenous Q12H    Time spent on care of this patient: No charge  Cherene Altes , MD   [facility] Office  [phone] Pager - Text Page per Amion as per below:  On-Call/Text Page:      [email or web address]      password [email or web address]  If 7PM-7AM, please contact night-coverage [email or web address] Password [email or web address] 05/29/2015, 8:20 AM   LOS: 0 days

## 2015-05-29 NOTE — ED Notes (Signed)
Pt reports being told in the past "that part of my intestines are wrapped around my ovary I think." Pt was sitting on bedside commode; dark red blood noted in bedpan. When pt stood up, pt c/o dizziness and appeared pale. Pt was assisted to bed. BP 92/63. Pt continues to have dark red diarrhea-like stools

## 2015-05-29 NOTE — Interval H&P Note (Signed)
History and Physical Interval Note:  05/29/2015 10:05 AM  [name]  has presented today for surgery, with the diagnosis of gi bleed, anemia.  The various methods of treatment have been discussed with the patient and family. After consideration of risks, benefits and other options for treatment, the patient has consented to  Procedure(s): ESOPHAGOGASTRODUODENOSCOPY (EGD) (N/A) as a surgical intervention .  The patient's history has been reviewed, patient examined, no change in status, stable for surgery.  I have reviewed the patient's chart and labs.  Questions were answered to the patient's satisfaction.     Pricilla Riffle. [facility] Plan

## 2015-05-29 NOTE — ED Notes (Signed)
Patient arrived via [facility]. EMS reports patient has had toothache x 3 weeks which she had been treating with Naproxen (NSAIDs). Patient experienced rectal bleeding on Friday, and then again today. BP 110/70, Pulse 98, Pulse ox 96% room air/99% on oxygen 2 LPM via nasal cannula. CBG 179. Denies abdominal pain. Patient is scheduled for routine colonoscopy in January 2017.

## 2015-05-29 NOTE — ED Provider Notes (Addendum)
CSN: [id number]     Arrival date & time 05/29/15  0052 History   By signing my name below, I, Forrestine Him, attest that this documentation has been prepared under the direction and in the presence of Merryl Hacker, MD.  Electronically Signed: Forrestine Him, ED Scribe. 05/29/2015. 1:36 AM.   Chief Complaint  Patient presents with  . Rectal Bleeding   The history is provided by the patient. No language interpreter was used.    HPI Comments: [name] brought in by EMS is a 69 y.o. female with a PMHx diverticulosis, internal hemorrhoids, and IBS who presents to the Emergency Department complaining of intermittent, ongoing rectal bleeding x 5 days. Pt states she has noted dark red blood when having "explosive diarrhea". She admits to 4 episodes since onset. No aggravating or alleviating factors at this time. Ms. Krizek also reports 1 episode of dry heaving today but denies any vomiting. Pt has has been taking several doses of Naproxen for ongoing dental pain. No recent fever, chills, or abdominal pain. Pt contacted her PCP prior to arrival, and was advised to come in for further evaluation. She is not currently on any anticoagulants.  PCP: Walker Kehr, MD    Past Medical History  Diagnosis Date  . Hiatal hernia   . Esophageal reflux   . Diverticulosis of colon (without mention of hemorrhage)   . Irritable bowel syndrome   . Anal fissure   . Incontinence of feces   . Degeneration of intervertebral disc, site unspecified   . Depressive disorder, not elsewhere classified   . Other and unspecified ovarian cyst   . Anxiety   . Depression   . Blind loop syndrome   . Other B-complex deficiencies   . History of colon polyps 08/24/2011    hyperplastic  . Family history of malignant neoplasm of gastrointestinal tract   . Anemia, unspecified   . Internal hemorrhoids    Past Surgical History  Procedure Laterality Date  . Cholecystectomy  2003  . Abdominal hysterectomy  1972  .  Cesarean section  1971  . Bladder suspension  [phone]  . Breast enhancement surgery  1980  . Abdominoplasty  1978   Family History  Problem Relation Age of Onset  . Hypertension Father   . Coronary artery disease Father   . Colon cancer Paternal Grandfather     Paternal greatfather  . Lung cancer Paternal Uncle   . Diabetes Father    Social History  Substance Use Topics  . Smoking status: Never Smoker   . Smokeless tobacco: Never Used  . Alcohol Use: 0.0 oz/week    0 Standard drinks or equivalent per week   OB History    No data available     Review of Systems  Constitutional: Negative for fever and chills.  HENT: Positive for dental problem.   Respiratory: Negative for cough.   Cardiovascular: Negative for chest pain.  Gastrointestinal: Positive for diarrhea and blood in stool. Negative for nausea, vomiting and abdominal pain.  Neurological: Negative for headaches.  Psychiatric/Behavioral: Negative for confusion.  All other systems reviewed and are negative.     Allergies  Bupropion hcl; Fluoxetine hcl; Latex; Propoxyphene n-acetaminophen; Venlafaxine; and Nickel  Home Medications   Prior to Admission medications   Medication Sig Start Date End Date Taking? Authorizing Provider  citalopram (CELEXA) 40 MG tablet Take 1 tablet (40 mg total) by mouth daily. 03/20/15  Yes Aleksei Plotnikov V, MD  conjugated estrogens (PREMARIN) vaginal cream  Place vaginally every 3 (three) days. pv 02/08/15  Yes Aleksei Plotnikov V, MD  naproxen sodium (ANAPROX) 220 MG tablet Take 220 mg by mouth 2 (two) times daily with a meal.   Yes Historical Provider, MD  pantoprazole (PROTONIX) 40 MG tablet Take 1 tablet (40 mg total) by mouth 2 (two) times daily. 03/28/15  Yes Irene Shipper, MD  Probiotic Product ([place] PO) Take 1 capsule by mouth daily.   Yes Historical Provider, MD  hydrocortisone (ANUSOL-HC) 2.5 % rectal cream Place 1 application rectally 2 (two) times daily.  Needs applicator to place some cream inside Patient not taking: Reported on 05/29/2015 04/30/14   Willia Craze, NP  loratadine (CLARITIN) 10 MG tablet Take 1 tablet (10 mg total) by mouth daily. Patient not taking: Reported on 05/29/2015 02/29/12 02/28/13  Rowe Clack, MD  Na Sulfate-K Sulfate-Mg Sulf SOLN Take 1 kit by mouth once. Patient not taking: Reported on 05/29/2015 03/28/15   Irene Shipper, MD   Triage Vitals: BP 121/75 mmHg  Pulse 81  Temp(Src) 98.2 F (36.8 C) (Oral)  Resp 19  SpO2 99%   Physical Exam  Constitutional: She is oriented to person, place, and time. She appears well-developed and well-nourished.  Appears younger than stated age  HENT:  Head: Normocephalic and atraumatic.  Cardiovascular: Normal rate, regular rhythm and normal heart sounds.   No murmur heard. Pulmonary/Chest: Effort normal and breath sounds normal. No respiratory distress. She has no wheezes.  Abdominal: Soft. Bowel sounds are normal. There is no tenderness. There is no rebound and no guarding.  Genitourinary:  Melena noted, no gross blood  Neurological: She is alert and oriented to person, place, and time.  Skin: Skin is warm and dry.  Psychiatric: She has a normal mood and affect.  Nursing note and vitals reviewed.   ED Course  Procedures (including critical care time)  CRITICAL CARE Performed by: Merryl Hacker   Total critical care time: 45 minutes  Critical care time was exclusive of separately billable procedures and treating other patients.  Critical care was necessary to treat or prevent imminent or life-threatening deterioration.  Critical care was time spent personally by me on the following activities: development of treatment plan with patient and/or surrogate as well as nursing, discussions with consultants, evaluation of patient's response to treatment, examination of patient, obtaining history from patient or surrogate, ordering and performing treatments and  interventions, ordering and review of laboratory studies, ordering and review of radiographic studies, pulse oximetry and re-evaluation of patient's condition.   DIAGNOSTIC STUDIES: Oxygen Saturation is 100% on RA, Normal by my interpretation.    COORDINATION OF CARE: 1:27 AM- Will give Protonix. Will order CBC and BMP. Discussed treatment plan with pt at bedside and pt agreed to plan.     1:51 AM- Spoke with Dr. Ardis Hughs regarding pts complaints and findings on exam. Pt will be admitted to medicine for further evaluation and he agreed to plan.   Labs Review Labs Reviewed  CBC WITH DIFFERENTIAL/PLATELET - Abnormal; Notable for the following:    RBC 3.48 (*)    Hemoglobin 10.7 (*)    HCT 31.5 (*)    All other components within normal limits  BASIC METABOLIC PANEL - Abnormal; Notable for the following:    CO2 19 (*)    Glucose, Bld 127 (*)    BUN 31 (*)    Calcium 8.8 (*)    All other components within normal limits  POC  OCCULT BLOOD, ED - Abnormal; Notable for the following:    Fecal Occult Bld POSITIVE (*)    All other components within normal limits  OCCULT BLOOD X 1 CARD TO LAB, STOOL  TYPE AND SCREEN  ABO/RH    Imaging Review No results found. I have personally reviewed and evaluated these images and lab results as part of my medical decision-making.   EKG Interpretation None      MDM   Final diagnoses:  Lower GI bleed    Patient presents with GI bleed. History of diverticulosis but also recent NSAID use. No abdominal pain and abdominal exam is reassuring. Given melena on exam and absence of abdominal pain, patient's bleeding could be secondary to diverticulosis; however, will give IV protonix to cover for brisk upper GI bleed.  Hemoglobin 10. Baseline 14. Discussed with Dr. Ardis Hughs on call for gastroenterology. Will admit patient to medicine. Patient is hemodynamically stable.  I personally performed the services described in this documentation, which was scribed in  my presence. The recorded information has been reviewed and is accurate.   Merryl Hacker, MD 05/29/15 [phone]  3:10AM Was advised by nursing that patient has had 3 large volume more bloody bowel movements. She got dizzy upon standing and dropped her systolic blood pressure to 92. Patient was placed on a Protonix drip, IV fluids ordered, both the hospitalist and the gastroenterologist were updated. I evaluated the patient. She does have large amount of more red stool in the bedpan. She is now complaining of some epigastric pain and cramping with bowel movements. Vital signs while sitting are stable. However, given evidence of ongoing bleeding and orthostasis, will change patient's bed request to a stepdown unit bed. Repeat H&H ordered. Blood product ordered as well.  Merryl Hacker, MD 05/29/15 [phone]

## 2015-05-29 NOTE — Transfer of Care (Signed)
Immediate Anesthesia Transfer of Care Note  Patient: [name]  Procedure(s) Performed: Procedure(s): ESOPHAGOGASTRODUODENOSCOPY (EGD) (N/A)  Patient Location: Endoscopy Unit  Anesthesia Type:MAC  Level of Consciousness: awake and patient cooperative  Airway & Oxygen Therapy: Patient Spontanous Breathing and Patient connected to nasal cannula oxygen  Post-op Assessment: Report given to RN, Post -op Vital signs reviewed and stable and Patient moving all extremities X 4  Post vital signs: Reviewed and stable  Last Vitals:  Filed Vitals:   05/29/15 1006 05/29/15 1052  BP: 142/72 104/41  Pulse:  85  Temp: 36.8 C 36.7 C  Resp: 16 23    Complications: No apparent anesthesia complications

## 2015-05-29 NOTE — Progress Notes (Signed)
UR COMPLETED  

## 2015-05-29 NOTE — Op Note (Signed)
[facility] [place], [address]   ENDOSCOPY PROCEDURE REPORT  PATIENT: [name], [name]  MR#: [id number] BIRTHDATE: Jan 22, 1946 , 69  yrs. old GENDER: female ENDOSCOPIST: Ladene Artist, MD, [facility] REFERRED BY:  [facility], [facility] PROCEDURE DATE:  05/29/2015 PROCEDURE:  EGD w/ biopsy ASA CLASS:     Class II INDICATIONS:  melena. MEDICATIONS: Monitored anesthesia care and Per Anesthesia TOPICAL ANESTHETIC: none DESCRIPTION OF PROCEDURE: After the risks benefits and alternatives of the procedure were thoroughly explained, informed consent was obtained.  The PENTAX GASTOROSCOPE [id number] endoscope was introduced through the mouth and advanced to the third portion of the duodenum , Without limitations.  The instrument was slowly withdrawn as the mucosa was fully examined.    STOMACH: A few sessile polyps ranging between 3-32mm in size were found in the gastric body.  Multiple sampling biopsies were performed.   The stomach otherwise appeared normal. DUODENUM: The duodenal mucosa showed no abnormalities in the 3rd part of the duodenum, bulb and 2nd part duodenum. ESOPHAGUS: The mucosa of the esophagus appeared normal.  Retroflexed views revealed a 6 cm hiatal hernia.  No blood noted. The scope was then withdrawn from the patient and the procedure completed.  COMPLICATIONS: There were no immediate complications.  ENDOSCOPIC IMPRESSION: 1.   Few sessile polyps in the gastric body; multiple sampling biopsies performed 2.   6 cm hiatal hernia 3.   The EGD otherwise appeared normal  RECOMMENDATIONS: 1.  Await pathology results 2.  Anti-reflux regimen 3.  Continue PPI for GERD 4.  Proceed with a Colonoscopy.  eSigned:  Ladene Artist, MD, [facility] 05/29/2015 10:50 AM

## 2015-05-29 NOTE — Progress Notes (Signed)
Patient c/o inability to urinate in bedpan, currently hemodynamically unstable so BSC not an option. Spoke with GI PA Sarah and order to place f/c was obtained. 51fr foley placed after peri care was completed. Clear yellow urine return immediatly seen. Pecolia Ades RN as second witness present during insertion.

## 2015-05-29 NOTE — Anesthesia Procedure Notes (Signed)
Procedure Name: MAC Date/Time: 05/29/2015 10:30 AM Performed by: Melina Copa, Bernita Beckstrom R Pre-anesthesia Checklist: Patient identified, Emergency Drugs available, Suction available, Patient being monitored and Timeout performed Patient Re-evaluated:Patient Re-evaluated prior to inductionOxygen Delivery Method: Nasal cannula Placement Confirmation: positive ETCO2 Dental Injury: Teeth and Oropharynx as per pre-operative assessment

## 2015-05-29 NOTE — Consult Note (Signed)
[facility] Consult: 9:44 AM 05/29/2015  LOS: 0 days    Referring Provider: Dr Thereasa Solo  Primary Care Physician:  Walker Kehr, MD Primary Gastroenterologist:  Formerly Dr. Sharlett Iles, now Dr Henrene Pastor, set up for surveillance colonoscopy 06/2015   Reason for Consultation:  GI bleed.  Melenic stool.     HPI: [name] is a 69 y.o. female.  Hx anxiety.  2003 cholecystectomy.  S/p 4 separate surgeries on her bladder.    Hx IBS-D for many years, though in recent years diarrhea has been intermittent.  Adenomatous colon polyp before 2002. GERD. Hx SB bacterial overgrowth.  04/30/2015 office Anoscopy.  For perianal itching.  Internal hemorrhoids.  08/2011 Flex Sig.  For unexplained diarrhea.  Mild diverticulosis.  Random biopsies with prominent lymphoid aggregate but otherwise normal.  07/2009 Colonoscopy.  For chronic diarrhea.  Severe sigmoid/descending diverticulosis, hypertrophied anal papillae (HP changes on path).  Random biopsies unremarkable.  01/2010 EGD. For unexplained abd pain.  HH, nodular fundal mucosa, otherwise normal. SB biopsies neg for celiac. Esophageal  Biopsy showed mild reflux changes, no Barretts 08/2005  Colonoscopy.  Diverticulosis. 08/2000  Colonoscopy.  Left sided tics.   Pt on BID PPI, probiotic at home.  Pt broke a tooth in 04/2015 and began use of Ibuprofen and/or Naprosyn for the last 3 weeks.  Noticed bloody BM 6 days ago, on Friday, at which point NSAID use ceased.  Stools turned melenic, black/maroon on 12/6. She continues to pass burgundy, malodorous liquid stools this AM in large quantity.  Rectal exam in ED confirmed melena.  BP 90/60.  She was started on PPI drip. .  Hgb 10.7, was 14 on 03/20/15.  Has received 1 PRBC so far.  BUN 31, was 13 on 03/20/15.  No abdominal pain.  Nausea  and dry heaving on Friday at start of bleeding but it resolved and has not recurred.  Mild dizziness, no chest pain, no palpitations, no dyspnea.    Past Medical History  Diagnosis Date  . GERD (gastroesophageal reflux disease) 2011    Hiatal hernia, nodular fundus on EGD  . Diverticulosis of colon (without mention of hemorrhage) 2002    left colon/sigmoid.   . Irritable bowel syndrome 2002    chronic diarrhea. biopsies neg for celiac, neg for microscopic/collagenous colitis   . Anal fissure   . Degeneration of intervertebral disc, site unspecified   . Other and unspecified ovarian cyst   . Depression with anxiety   . Bacterial overgrowth syndrome 2002  . Other B-complex deficiencies   . History of colon polyps 08/24/2011    hyperplastic 2013, adenomatous before 2002  . Anemia, unspecified   . Internal hemorrhoids 04/2015    pruritus ani.     Past Surgical History  Procedure Laterality Date  . Cholecystectomy  2003  . Abdominal hysterectomy  1972  . Cesarean section  1971  . Bladder suspension  [phone]  . Breast enhancement surgery  1980  . Abdominoplasty  1978    Prior to Admission medications   Medication Sig Start Date End  Date Taking? Authorizing Provider  citalopram (CELEXA) 40 MG tablet Take 1 tablet (40 mg total) by mouth daily. 03/20/15  Yes Aleksei Plotnikov V, MD  conjugated estrogens (PREMARIN) vaginal cream Place vaginally every 3 (three) days. pv 02/08/15  Yes Aleksei Plotnikov V, MD  pantoprazole (PROTONIX) 40 MG tablet Take 1 tablet (40 mg total) by mouth 2 (two) times daily. 03/28/15  Yes Irene Shipper, MD  Probiotic Product ([place] PO) Take 1 capsule by mouth daily.   Yes Historical Provider, MD  loratadine (CLARITIN) 10 MG tablet Take 1 tablet (10 mg total) by mouth daily. Patient not taking: Reported on 05/29/2015 02/29/12 02/28/13  Rowe Clack, MD    Scheduled Meds: . sodium chloride   Intravenous Once  . citalopram  40 mg Oral Daily   . sodium chloride  500 mL Intravenous Once  . sodium chloride  3 mL Intravenous Q12H   Infusions: . sodium chloride    . pantoprozole (PROTONIX) infusion 8 mg/hr (05/29/15 0800)   PRN Meds: oxyCODONE   Allergies as of 05/29/2015 - Review Complete 05/29/2015  Allergen Reaction Noted  . Bupropion hcl  05/23/2010  . Fluoxetine hcl  02/26/2009  . Latex  02/05/2010  . Propoxyphene n-acetaminophen    . Venlafaxine  02/26/2009  . Nickel Rash 03/28/2015    Family History  Problem Relation Age of Onset  . Hypertension Father   . Coronary artery disease Father   . Colon cancer Paternal Grandfather     Paternal greatfather  . Lung cancer Paternal Uncle   . Diabetes Father     Social History   Social History  . Marital Status: Divorced    Spouse Name: N/A  . Number of Children: 3  . Years of Education: N/A   Occupational History  . retired    Social History Main Topics  . Smoking status: Never Smoker   . Smokeless tobacco: Never Used  . Alcohol Use: 0.0 oz/week    0 Standard drinks or equivalent per week  . Drug Use: No  . Sexual Activity: Not on file   Other Topics Concern  . Not on file   Social History Narrative    REVIEW OF SYSTEMS: Constitutional:  Generally good energy level.  Weight stable ENT:  No nose bleeds Pulm:  No SOB or cough CV:  No palpitations, no LE edema.  GU:  Unable to urinate in bed pan.  Bladder feels full, uncomfortable.  Getting hypotensive when she gets up onto commode.  GI:  Per HPI Heme:  No unusual bleeding until last week, no  Excessive bruising   Transfusions:  None in past Neuro:  No headaches, no peripheral tingling or numbness Derm:  No itching, no rash or sores.  Endocrine:  No sweats or chills.  No polyuria or dysuria Immunization:  Reviewed.  fluvax up to date.  Travel:  None beyond local counties in last few months.    PHYSICAL EXAM: Vital signs in last 24 hours: Filed Vitals:   05/29/15 0900 05/29/15 0917  BP:  118/71 108/69  Pulse: 104 90  Temp:    Resp: 26 19   Wt Readings from Last 3 Encounters:  04/22/15 73.029 kg (161 lb)  03/28/15 74.208 kg (163 lb 9.6 oz)  03/20/15 73.936 kg (163 lb)   General: pleasant, comfortable, pale but overall looks well.  Head:  No asymmetry or swelling  Eyes:  No icterus or pallor Ears:  Not HOH  Nose:  No congestion  or discharge Mouth:  Clear bil, no labored breathing Neck:  No mass, no JVD, no TMG Lungs:  Clear bil.  No labored breathing or cough.  Heart: RRR.  No MRG.   Abdomen:  Soft, NT, ND.  Active BS.  No hernia  Rectal: large amount melenic stool   Musc/Skeltl: no joint swelling, redness or deformities Extremities:  No CCE  Neurologic:  Oriented x 3.  No limb weakness, no tremor.  No gross deficits Skin:  + pallor, no telangectasia, rash, sores Tattoos:  none Nodes:  No cervical adenopathy.    Psych:  Pleasant, cooperative, calm.   Intake/Output from previous day: 12/06 0701 - 12/07 0700 In: 335 [Blood:335] Out: -  Intake/Output this shift:    LAB RESULTS:  Recent Labs  05/29/15 0100 05/29/15 0325  WBC 6.6  --   HGB 10.7* 10.5*  HCT 31.5* 31.4*  PLT 218  --    BMET Lab Results  Component Value Date   NA 137 05/29/2015   NA 141 03/20/2015   NA 137 08/29/2014   K 3.6 05/29/2015   K 4.3 03/20/2015   K 4.6 08/29/2014   CL 108 05/29/2015   CL 104 03/20/2015   CL 105 08/29/2014   CO2 19* 05/29/2015   CO2 30 03/20/2015   CO2 28 08/29/2014   GLUCOSE 127* 05/29/2015   GLUCOSE 118* 03/20/2015   GLUCOSE 90 08/29/2014   BUN 31* 05/29/2015   BUN 13 03/20/2015   BUN 20 08/29/2014   CREATININE 0.93 05/29/2015   CREATININE 0.84 03/20/2015   CREATININE 0.79 08/29/2014   CALCIUM 8.8* 05/29/2015   CALCIUM 9.2 03/20/2015   CALCIUM 9.4 08/29/2014    Lipase     Component Value Date/Time   LIPASE 36.0 02/04/2010 1102    Drugs of Abuse  No results found for: LABOPIA, COCAINSCRNUR, LABBENZ, AMPHETMU, THCU, LABBARB    RADIOLOGY STUDIES: No results found.  ENDOSCOPIC STUDIES: Per HPI  IMPRESSION:   *  GI bleed, UGI source suspected.  Rule out NSAID induced ulcer.   *  ABL anemia. Normocytic.  S/p PRBC x 1.    *  IBS-D.  In recent years has calmed down  *  S/p multiple bladder surgeries, currently difficulty voiding.    PLAN:     *  EGD this AM    *  Hgb now, in 8 hours, CBC in AM.  coags now.   *  Place foley.   Azucena Freed  05/29/2015, 9:44 AM Pager: [phone]      Attending physician's note   I have taken a history, examined the patient and reviewed the chart. I agree with the Advanced Practitioner's note, impression and recommendations. Acute GI bleed with melena, very likely UGI source. R/O NSAID ulcer. IVFs and transfuse for Hb < 8. IV PPI infusion. Avoid NSAIDs. EGD today. The risks (including bleeding, perforation, infection, missed lesions, medication reactions and possible hospitalization or surgery if complications occur), benefits, and alternatives to endoscopy with possible biopsy and possible dilation were discussed with the patient and they consent to proceed.    Lucio Edward, MD [facility] [phone] Mon-Fri 8a-5p [phone] after 5p, weekends, holidays

## 2015-05-30 ENCOUNTER — Encounter (HOSPITAL_COMMUNITY): Payer: Self-pay | Admitting: Gastroenterology

## 2015-05-30 ENCOUNTER — Telehealth: Payer: Self-pay

## 2015-05-30 ENCOUNTER — Encounter (HOSPITAL_COMMUNITY)
Admission: EM | Disposition: A | Discharge status: Home Health | Payer: Commercial Managed Care - HMO | Source: Home / Self Care | Attending: Internal Medicine

## 2015-05-30 DIAGNOSIS — F411 Generalized anxiety disorder: Secondary | ICD-10-CM | POA: Diagnosis present

## 2015-05-30 DIAGNOSIS — Z86010 Personal history of colonic polyps: Secondary | ICD-10-CM

## 2015-05-30 DIAGNOSIS — K922 Gastrointestinal hemorrhage, unspecified: Secondary | ICD-10-CM | POA: Diagnosis present

## 2015-05-30 DIAGNOSIS — F32A Depression, unspecified: Secondary | ICD-10-CM | POA: Diagnosis present

## 2015-05-30 DIAGNOSIS — F329 Major depressive disorder, single episode, unspecified: Secondary | ICD-10-CM | POA: Diagnosis present

## 2015-05-30 DIAGNOSIS — K317 Polyp of stomach and duodenum: Secondary | ICD-10-CM | POA: Diagnosis present

## 2015-05-30 HISTORY — PX: COLONOSCOPY: SHX5424

## 2015-05-30 LAB — COMPREHENSIVE METABOLIC PANEL
ALBUMIN: 2.6 g/dL — AB (ref 3.5–5.0)
ALK PHOS: 37 U/L — AB (ref 38–126)
ALT: 13 U/L — ABNORMAL LOW (ref 14–54)
AST: 15 U/L (ref 15–41)
Anion gap: 5 (ref 5–15)
BILIRUBIN TOTAL: 0.6 mg/dL (ref 0.3–1.2)
BUN: 14 mg/dL (ref 6–20)
CALCIUM: 7.4 mg/dL — AB (ref 8.9–10.3)
CO2: 22 mmol/L (ref 22–32)
Chloride: 115 mmol/L — ABNORMAL HIGH (ref 101–111)
Creatinine, Ser: 0.68 mg/dL (ref 0.44–1.00)
GFR calc Af Amer: >60 mL/min (ref >60)
GFR calc non Af Amer: >60 mL/min (ref >60)
GLUCOSE: 97 mg/dL (ref 65–99)
Potassium: 3.5 mmol/L (ref 3.5–5.1)
Sodium: 142 mmol/L (ref 135–145)
TOTAL PROTEIN: 4.2 g/dL — AB (ref 6.5–8.1)

## 2015-05-30 LAB — CBC
HEMATOCRIT: 26.1 % — AB (ref 36.0–46.0)
HEMOGLOBIN: 9 g/dL — AB (ref 12.0–15.0)
MCH: 31 pg (ref 26.0–34.0)
MCHC: 34.5 g/dL (ref 30.0–36.0)
MCV: 90 fL (ref 78.0–100.0)
Platelets: 126 10*3/uL — ABNORMAL LOW (ref 150–400)
RBC: 2.9 MIL/uL — ABNORMAL LOW (ref 3.87–5.11)
RDW: 14.7 % (ref 11.5–15.5)
WBC: 5.5 10*3/uL (ref 4.0–10.5)

## 2015-05-30 SURGERY — COLONOSCOPY
Anesthesia: Moderate Sedation

## 2015-05-30 MED ORDER — FENTANYL CITRATE (PF) 100 MCG/2ML IJ SOLN
INTRAMUSCULAR | Status: DC | PRN
  Administered 2015-05-30 (×3): 25 ug via INTRAVENOUS

## 2015-05-30 MED ORDER — MIDAZOLAM HCL 5 MG/5ML IJ SOLN
INTRAMUSCULAR | Status: DC | PRN
  Administered 2015-05-30: 1 mg via INTRAVENOUS
  Administered 2015-05-30 (×2): 2 mg via INTRAVENOUS
  Administered 2015-05-30: 1 mg via INTRAVENOUS

## 2015-05-30 MED ORDER — MIDAZOLAM HCL 5 MG/ML IJ SOLN
INTRAMUSCULAR | Status: AC
Start: 2015-05-30 — End: 2015-05-30
  Filled 2015-05-30: qty 2

## 2015-05-30 MED ORDER — FENTANYL CITRATE (PF) 100 MCG/2ML IJ SOLN
INTRAMUSCULAR | Status: AC
  Filled 2015-05-30: qty 4

## 2015-05-30 MED ORDER — SODIUM CHLORIDE 0.9 % IV SOLN: INTRAVENOUS | Status: DC

## 2015-05-30 MED ORDER — DIPHENHYDRAMINE HCL 50 MG/ML IJ SOLN
INTRAMUSCULAR | Status: AC
  Filled 2015-05-30: qty 1

## 2015-05-30 MED ORDER — DIPHENHYDRAMINE HCL 50 MG/ML IJ SOLN
INTRAMUSCULAR | Status: DC | PRN
  Administered 2015-05-30: 25 mg via INTRAVENOUS

## 2015-05-30 NOTE — Telephone Encounter (Signed)
appt cancelled

## 2015-05-30 NOTE — Telephone Encounter (Signed)
-----   Message from Ladene Artist, MD sent at 05/30/2015 11:55 AM EST ----- [name] pt had colonoscopy today at [facility]. Was scheduled for colonoscopy with [name] in [facility] in 06/2015 and this should be cancelled.

## 2015-05-30 NOTE — Op Note (Signed)
[facility] [place], [address]   COLONOSCOPY PROCEDURE REPORT  PATIENT: [name], [name]  MR#: [id number] BIRTHDATE: Sep 17, 1945 , 69  yrs. old GENDER: female ENDOSCOPIST: Ladene Artist, MD, [facility] REFERRED [id number] Hospitalists PROCEDURE DATE:  05/30/2015 PROCEDURE:   Colonoscopy, diagnostic First Screening Colonoscopy - Avg.  risk and is 50 yrs.  old or older - No.  Prior Negative Screening - Now for repeat screening. N/A  History of Adenoma - Now for follow-up colonoscopy & has been > or = to 3 yrs.  Yes hx of adenoma.  Has been 3 or more years since last colonoscopy.  Polyps removed today? No Recommend repeat exam, <10 yrs? Yes high risk ASA CLASS:   Class II INDICATIONS:Evaluation of unexplained GI bleeding, Surveillance due to prior colonic neoplasia, PH Colon Adenoma, and melena. MEDICATIONS: Benadryl 25 mg IV, Fentanyl 75 mcg IV, and Versed 6 mg IV DESCRIPTION OF PROCEDURE:   After the risks benefits and alternatives of the procedure were thoroughly explained, informed consent was obtained.  The digital rectal exam revealed no abnormalities of the rectum.   The Pentax Ped Colon [id number] endoscope was introduced through the anus and advanced to the cecum, which was identified by both the appendix and ileocecal valve. No adverse events experienced.   The quality of the prep was adequate (MoviPrep was used)  The instrument was then slowly withdrawn as the colon was fully examined. Estimated blood loss is zero unless otherwise noted in this procedure report.    COLON FINDINGS: There was moderate diverticulosis noted in the sigmoid colon and descending colon with associated tortuosity, muscular hypertrophy and luminal narrowing.   There was mild diverticulosis noted in the transverse colon. Small amounts of old blood noted in left colon, rinsed/suctioned, no acitve bleeding was noted.  The examination was otherwise normal.   Retroflexed views revealed no abnormalities. The time to cecum = 7.2 Withdrawal time = 14.8   The scope was withdrawn and the procedure completed. COMPLICATIONS: There were no immediate complications.  ENDOSCOPIC IMPRESSION: 1.   Moderate diverticulosis in the sigmoid colon and descending colon 2.   Mild diverticulosis in the transverse colon  RECOMMENDATIONS: 1.  Hold Aspirin and all other NSAIDS for 2 weeks. 2.  High fiber diet with liberal fluid intake. 3.  Consider repeat colonoscopy in 5 years for surveillance, per Dr. Henrene Pastor 4.  Presumed diverticular bleed  eSigned:  Ladene Artist, MD, [facility] 05/30/2015 11:53 AM

## 2015-05-30 NOTE — Progress Notes (Signed)
[place] TEAM 1 - Stepdown/ICU TEAM Progress Note  [name] [id number] DOB: Aug 29, 1945 DOA: 05/29/2015 PCP: Walker Kehr, MD  Admit HPI / Brief Narrative: 69 y.o. WF PMHx Anxiety, diverticulosis, IBS  Presents with melena for one week.  The patient has had worsening dental pain over the last month. This started with a broken tooth, for which she was hoping to get treatment in January when her supplemental insurance kicked in. In the meantime the patient was taking 2 tablets of ibuprofen or naproxen) she is not sure which) every 6 hours for about 3 weeks until last Friday when she had a bloody bowel movement.   She called her gastroenterologist who recommended follow-up appointment this week and also her son who is a GP who recommended she stop NSAIDs. Unfortunately, after 2 days, she again had black sticky bowel movements several times yesterday and today. Today then she had a large frankly bloody bowel movement ("looked like a crime scene in there") and came to the ER.  In the ED, the patient was hemodynamically stable, had melena on exam that was hemoccult positive, and was noted to have hemoglobin 10.7 g/dL from previous 14 g/dL. Pantoprazole bolus was administered and TRH were asked to admit for suspected UGIB.  While in the ER, the patient had 2-3 large frankly bloody stools, and developed orthostatic symptoms with sitting up as well as BP 90/60 mmHg while standing. Transfusion of PRBCs was administered as well as pantoprazole infusion and the patient was transferred to stepdown.    HPI/Subjective: 12/8 A/O 4, negative N/V, negative. States negative previous history of GI bleed. States GI bleed started~a week ago.  Assessment/Plan: Multiple gastric polyps/UGIB/LGIB  -GI has been consulted -12/7S/P EGD showing multiple polyps pathology pending -Continue protonix 40 mg BID -Colonoscopy today  Acute blood loss anemia  -Stable overnight continue to monitor  closely  Depression/anxiety -Continue citalopram 40 mg daily  Dental pain     Code Status: FULL Family Communication: no family present at time of exam Disposition Plan: Per GI    Consultants: Dr.Malcolm T Fuller Plan GI  Procedure/Significant Events: 12/7 EGD;-Few sessile polyps in the gastric body; multiple biopsies pending - 6 cm hiatal hernia   Culture 12/7 polyp pathology pending  Antibiotics: NA  DVT prophylaxis: SCD   Devices NA   LINES / TUBES:  NA    Continuous Infusions: . sodium chloride 125 mL/hr at 05/29/15 1126    Objective: VITAL SIGNS: Temp: 98.7 F (37.1 C) (12/08 1935) Temp Source: Oral (12/08 1935) BP: 111/77 mmHg (12/08 1630) Pulse Rate: 95 (12/08 1630) SPO2; FIO2:   Intake/Output Summary (Last 24 hours) at 05/30/15 1950 Last data filed at 05/30/15 1651  Gross per 24 hour  Intake   1830 ml  Output   2250 ml  Net   -420 ml     Exam: General: A/O 4, NAD No acute respiratory distress Eyes: Negative headache, eye pain, double vision,negative scleral hemorrhage ENT: Negative Runny nose, negative ear pain, negative gingival bleeding, Neck:  Negative scars, masses, torticollis, lymphadenopathy, JVD Lungs: Clear to auscultation bilaterally without wheezes or crackles Cardiovascular: Regular rate and rhythm without murmur gallop or rub normal S1 and S2 Abdomen:negative abdominal pain, nondistended, positive soft, bowel sounds, no rebound, no ascites, no appreciable mass Extremities: No significant cyanosis, clubbing, or edema bilateral lower extremities Psychiatric:  Negative depression, negative anxiety, negative fatigue, negative mania  Neurologic:  Cranial nerves II through XII intact, tongue/uvula midline, all extremities muscle strength 5/5, sensation  intact throughout, negative dysarthria, negative expressive aphasia, negative receptive aphasia.   Data Reviewed: Basic Metabolic Panel:  Recent Labs Lab 05/29/15 0100  05/30/15 0550  NA 137 142  K 3.6 3.5  CL 108 115*  CO2 19* 22  GLUCOSE 127* 97  BUN 31* 14  CREATININE 0.93 0.68  CALCIUM 8.8* 7.4*   Liver Function Tests:  Recent Labs Lab 05/30/15 0550  AST 15  ALT 13*  ALKPHOS 37*  BILITOT 0.6  PROT 4.2*  ALBUMIN 2.6*   No results for input(s): LIPASE, AMYLASE in the last 168 hours. No results for input(s): AMMONIA in the last 168 hours. CBC:  Recent Labs Lab 05/29/15 0100 05/29/15 0325 05/29/15 1006 05/29/15 1524 05/30/15 0550  WBC 6.6  --   --  8.2 5.5  NEUTROABS 4.4  --   --   --   --   HGB 10.7* 10.5* 9.4* 8.4* 9.0*  HCT 31.5* 31.4* 27.3* 24.6* 26.1*  MCV 90.5  --   --  88.5 90.0  PLT 218  --   --  144* 126*   Cardiac Enzymes: No results for input(s): CKTOTAL, CKMB, CKMBINDEX, TROPONINI in the last 168 hours. BNP (last 3 results) No results for input(s): BNP in the last 8760 hours.  ProBNP (last 3 results) No results for input(s): PROBNP in the last 8760 hours.  CBG: No results for input(s): GLUCAP in the last 168 hours.  Recent Results (from the past 240 hour(s))  MRSA PCR Screening     Status: None   Collection Time: 05/29/15  5:47 PM  Result Value Ref Range Status   MRSA by PCR NEGATIVE NEGATIVE Final    Comment:        The GeneXpert MRSA Assay (FDA approved for NASAL specimens only), is one component of a comprehensive MRSA colonization surveillance program. It is not intended to diagnose MRSA infection nor to guide or monitor treatment for MRSA infections.      Studies:  Recent x-ray studies have been reviewed in detail by the Attending Physician  Scheduled Meds:  Scheduled Meds: . sodium chloride   Intravenous Once  . citalopram  40 mg Oral Daily  . pantoprazole  40 mg Oral BID  . sodium chloride  3 mL Intravenous Q12H    Time spent on care of this patient: 40 mins   WOODS, Geraldo Docker , MD  [facility] Office  [phone] Pager - [phone]  On-Call/Text Page:       [email or web address]      password [email or web address]  If 7PM-7AM, please contact night-coverage [email or web address] Password [email or web address] 05/30/2015, 7:50 PM   LOS: 1 day   Care during the described time interval was provided by me .  I have reviewed this patient's available data, including medical history, events of note, physical examination, and all test results as part of my evaluation. I have personally reviewed and interpreted all radiology studies.   Dia Crawford, MD [phone] Pager

## 2015-05-30 NOTE — Interval H&P Note (Signed)
History and Physical Interval Note:  05/30/2015 11:04 AM  [name]  has presented today for surgery, with the diagnosis of gi bleed, anemia.  The various methods of treatment have been discussed with the patient and family. After consideration of risks, benefits and other options for treatment, the patient has consented to  Procedure(s): COLONOSCOPY (N/A) as a surgical intervention .  The patient's history has been reviewed, patient examined, no change in status, stable for surgery.  I have reviewed the patient's chart and labs.  Questions were answered to the patient's satisfaction.     Pricilla Riffle. Fuller Plan

## 2015-05-31 ENCOUNTER — Encounter (HOSPITAL_COMMUNITY): Payer: Self-pay | Admitting: Gastroenterology

## 2015-05-31 LAB — CBC
HCT: 25.5 % — ABNORMAL LOW (ref 36.0–46.0)
HEMOGLOBIN: 8.5 g/dL — AB (ref 12.0–15.0)
MCH: 30.1 pg (ref 26.0–34.0)
MCHC: 33.3 g/dL (ref 30.0–36.0)
MCV: 90.4 fL (ref 78.0–100.0)
PLATELETS: 127 10*3/uL — AB (ref 150–400)
RBC: 2.82 MIL/uL — ABNORMAL LOW (ref 3.87–5.11)
RDW: 14.6 % (ref 11.5–15.5)
WBC: 3.7 10*3/uL — ABNORMAL LOW (ref 4.0–10.5)

## 2015-05-31 MED ORDER — TRAMADOL HCL 50 MG PO TABS
50.0000 mg | ORAL_TABLET | Freq: Four times a day (QID) | ORAL | Status: DC | PRN
  Administered 2015-05-31 – 2015-06-10 (×26): 50 mg via ORAL
  Filled 2015-05-31 (×27): qty 1

## 2015-05-31 MED ORDER — CITALOPRAM HYDROBROMIDE 20 MG PO TABS
20.0000 mg | ORAL_TABLET | Freq: Every day | ORAL | Status: DC
  Administered 2015-06-01 – 2015-06-10 (×10): 20 mg via ORAL
  Filled 2015-05-31 (×10): qty 1

## 2015-05-31 MED ORDER — OXYCODONE HCL 5 MG PO TABS: 5.0000 mg | ORAL_TABLET | ORAL | Status: DC | PRN

## 2015-05-31 NOTE — Progress Notes (Signed)
[place] TEAM 1 - Stepdown/ICU TEAM PROGRESS NOTE  [name] [id number] DOB: [birth year] DOA: 05/29/2015 PCP: Walker Kehr, MD  Admit HPI / Brief Narrative: 69 y.o. female with a history of anxiety who presented with melena for one week.  The patient had worsening dental pain over the preceding month for which she was taking 2 tablets of ibuprofen every 6 hours until she had a bloody bowel movement.  She called her gastroenterologist who recommended a follow-up appointment, and also her son (a physician) who recommended she stop NSAIDs. Despite following this advice she again had black sticky bowel movements several times. The day of her presentation she had a large frankly bloody bowel movement ("looked like a crime scene in there") and came to the ER.  In the ED, the patient was hemodynamically stable, had melena on exam that was hemoccult positive, and was noted to have hemoglobin 10.7 g/dL from previous 14 g/dL. While in the ER, the patient had 2-3 large frankly bloody stools, and developed orthostatic symptoms with sitting up as well as BP 90/60 mmHg while standing. Transfusion of PRBCs was administered as well as pantoprazole infusion and the patient was transferred to stepdown.   HPI/Subjective: The patient is resting comfortably in bed.  She reports no further episodes of bleeding since yesterday.  She is tolerating her clear liquid diet without difficulty.  She complains of ongoing dental pain and a generalized headache which she refers to as "a migraine."  She denies shortness of breath chest pain or abdominal pain.  Assessment/Plan:  UGIB v/s LGIB GI following - no clear source on EGD or colo - presumed diverticular source - avoid NSAIDs/ASA for 2weeks - advance diet and follow  Acute blood loss anemia  Due to GIB - Hgb appears to have stabilized - recheck this AM and again tomorrow morning  Multiple gastric polyps Noted on EGD - path reveals fundic gland polyps     Moderate diverticulosis  Noted on colo - discussed adding fiber to diet w/ pt, and possibility of recurrent bleeding   Depression/anxiety Continue home citalopram  Dental pain  Adjust pain med and follow clinically - pt is connected w/ dentist for f/u after d/c   Code Status: FULL Family Communication: no family present at time of exam Disposition Plan: stable for transfer to med bed - PT/OT evals - prob d/c home in 24-48hrs  Consultants: [facility]  Procedures: 12/7 EGD -Few sessile polyps in the gastric body; multiple biopsies pending - 6 cm hiatal hernia 12/8 Colo - Moderate diverticulosis in the sigmoid colon and descending colon - Mild diverticulosis in the transverse colon  Antibiotics: none  DVT prophylaxis: SCDs  Objective: Blood pressure 106/61, pulse 80, temperature 98 F (36.7 C), temperature source Oral, resp. rate 12, height 5\' 4"  (1.626 m), weight 72.576 kg (160 lb), SpO2 100 %.  Intake/Output Summary (Last 24 hours) at 05/31/15 0936 Last data filed at 05/31/15 0800  Gross per 24 hour  Intake    690 ml  Output   2150 ml  Net  -1460 ml   Exam: General: No acute respiratory distress Lungs: Clear to auscultation bilaterally without wheezes or crackles Cardiovascular: Regular rate and rhythm without murmur gallop or rub normal S1 and S2 Abdomen: Nontender, nondistended, soft, bowel sounds positive, no rebound, no ascites, no appreciable mass Extremities: No significant cyanosis, clubbing, or edema bilateral lower extremities   Data Reviewed: Basic Metabolic Panel:  Recent Labs Lab 05/29/15 0100 05/30/15 0550  NA  137 142  K 3.6 3.5  CL 108 115*  CO2 19* 22  GLUCOSE 127* 97  BUN 31* 14  CREATININE 0.93 0.68  CALCIUM 8.8* 7.4*    CBC:  Recent Labs Lab 05/29/15 0100 05/29/15 0325 05/29/15 1006 05/29/15 1524 05/30/15 0550  WBC 6.6  --   --  8.2 5.5  NEUTROABS 4.4  --   --   --   --   HGB 10.7* 10.5* 9.4* 8.4* 9.0*  HCT 31.5* 31.4*  27.3* 24.6* 26.1*  MCV 90.5  --   --  88.5 90.0  PLT 218  --   --  144* 126*    Liver Function Tests:  Recent Labs Lab 05/30/15 0550  AST 15  ALT 13*  ALKPHOS 37*  BILITOT 0.6  PROT 4.2*  ALBUMIN 2.6*   Coags:  Recent Labs Lab 05/29/15 1006  INR 1.34    Recent Results (from the past 240 hour(s))  MRSA PCR Screening     Status: None   Collection Time: 05/29/15  5:47 PM  Result Value Ref Range Status   MRSA by PCR NEGATIVE NEGATIVE Final    Comment:        The GeneXpert MRSA Assay (FDA approved for NASAL specimens only), is one component of a comprehensive MRSA colonization surveillance program. It is not intended to diagnose MRSA infection nor to guide or monitor treatment for MRSA infections.      Studies:   Recent x-ray studies have been reviewed in detail by the Attending Physician  Scheduled Meds:  Scheduled Meds: . sodium chloride   Intravenous Once  . citalopram  40 mg Oral Daily  . pantoprazole  40 mg Oral BID  . sodium chloride  3 mL Intravenous Q12H    Time spent on care of this patient: 35 mins   MCCLUNG,JEFFREY T , MD   [facility] Office  [phone] Pager - Text Page per Shea Evans as per below:  On-Call/Text Page:      [email or web address]      password [email or web address]  If 7PM-7AM, please contact night-coverage [email or web address] Password [email or web address] 05/31/2015, 9:36 AM   LOS: 2 days

## 2015-05-31 NOTE — Progress Notes (Signed)
Patient transferred from 3S via wheelchair. Alert and oriented. Accalmated to room. Assessed. Pull RAC IV d/t irritation and swelling. Bruising to left AC from blood draw and to right hand. Skin intact, one assist, moderate fall risk. Will continue to follow as needed.

## 2015-05-31 NOTE — Progress Notes (Signed)
Daily Rounding Note  05/31/2015, 9:35 AM  LOS: 2 days   SUBJECTIVE:       No blood passed since early yesterday evening.  Diet advanced to D 3.    Still with tooth pain and some associated headache.   OBJECTIVE:         Vital signs in last 24 hours:    Temp:  [97.6 F (36.4 C)-98.7 F (37.1 C)] 98 F (36.7 C) (12/09 0740) Pulse Rate:  [76-95] 80 (12/09 0740) Resp:  [12-43] 12 (12/09 0740) BP: (75-151)/(30-133) 106/61 mmHg (12/09 0740) SpO2:  [93 %-100 %] 100 % (12/09 0740) Weight:  [72.576 kg (160 lb)] 72.576 kg (160 lb) (12/08 1023) Last BM Date: 05/30/15 Filed Weights   05/30/15 1023  Weight: 72.576 kg (160 lb)   General: comfortable, pale Heart: RRR Chest: clear bil.  No labored resps or cough Abdomen: soft, NT, ND.  BS active  Extremities: no CCE Neuro/Psych:  Oriented x 3.  No gross deficits.  Fully alert.   Intake/Output from previous day: 12/08 0701 - 12/09 0700 In: 36 [P.O.:120; I.V.:450] Out: 2250 [Urine:2250]  Intake/Output this shift: Total I/O In: 120 [P.O.:120] Out: 200 [Urine:200]  Lab Results:  Recent Labs  05/29/15 0100  05/29/15 1006 05/29/15 1524 05/30/15 0550  WBC 6.6  --   --  8.2 5.5  HGB 10.7*  < > 9.4* 8.4* 9.0*  HCT 31.5*  < > 27.3* 24.6* 26.1*  PLT 218  --   --  144* 126*  < > = values in this interval not displayed. BMET  Recent Labs  05/29/15 0100 05/30/15 0550  NA 137 142  K 3.6 3.5  CL 108 115*  CO2 19* 22  GLUCOSE 127* 97  BUN 31* 14  CREATININE 0.93 0.68  CALCIUM 8.8* 7.4*   LFT  Recent Labs  05/30/15 0550  PROT 4.2*  ALBUMIN 2.6*  AST 15  ALT 13*  ALKPHOS 37*  BILITOT 0.6   PT/INR  Recent Labs  05/29/15 1006  LABPROT 16.7*  INR 1.34   Hepatitis Panel No results for input(s): HEPBSAG, HCVAB, HEPAIGM, HEPBIGM in the last 72 hours.  Studies/Results: No results found.   Scheduled Meds: . sodium chloride   Intravenous Once  .  citalopram  40 mg Oral Daily  . pantoprazole  40 mg Oral BID  . sodium chloride  3 mL Intravenous Q12H   Continuous Infusions: . sodium chloride 125 mL/hr at 05/29/15 1126  . sodium chloride     PRN Meds:.menthol-cetylpyridinium, oxyCODONE   ASSESMENT:   * GI bleed.  Melenic/burgundy stools 12/7 EGD: sessile gastric polyps removed (path: benign fundal gland polyp), 6 cm HH.   12/8 Colonoscopy: transverse, descending,sigmoid tics Presumed diverticular bleed.   * ABL anemia. Normocytic. S/p PRBC x 2: 12/7 and 12/8. CBC pending.   *  Azotemia, resolved.  *  Dental pain, cracked tooth.    * IBS-D. In recent years has calmed down    PLAN   *  Await Hgb. BID oral PPI as at home.  CBC in AM.    *  Avoid ASA, NSAIDs.       *  GI  Follow up prn.  If rebleeds, go to nuc med RBC scan and if +: angiogram/embolization.  All d/w pt.  *  Home tomorrow if no further acute bleeding.      Azucena Freed  05/31/2015, 9:35 AM Pager: [phone]  Attending physician's note   I have taken an interval history, reviewed the chart and examined the patient. I agree with the Advanced Practitioner's note, impression and recommendations. GI bleed has resolved. Hb=8.5 today. Presumed diverticular bleed. Avoid ASA/NSAIDs and heavy lifting/heavy exertion for at least 2 weeks. If bleeding recurs consider nuclear RBC scan and angiogram. Advance to solid food. Continue PPI bid for chronic GERD. If diet tolerated, Hb stable and no recurrent bleeding should be ok for discharge tomorrow. GI signing off. Outpatient GI follow up with Dr. Scarlette Shorts.   Lucio Edward, MD [facility] [phone] Mon-Fri 8a-5p [phone] after 5p, weekends, holidays

## 2015-06-01 LAB — CBC
HEMATOCRIT: 25.7 % — AB (ref 36.0–46.0)
HEMOGLOBIN: 8.8 g/dL — AB (ref 12.0–15.0)
MCH: 30.6 pg (ref 26.0–34.0)
MCHC: 34.2 g/dL (ref 30.0–36.0)
MCV: 89.2 fL (ref 78.0–100.0)
Platelets: 130 10*3/uL — ABNORMAL LOW (ref 150–400)
RBC: 2.88 MIL/uL — ABNORMAL LOW (ref 3.87–5.11)
RDW: 14.5 % (ref 11.5–15.5)
WBC: 3.8 10*3/uL — ABNORMAL LOW (ref 4.0–10.5)

## 2015-06-01 LAB — BASIC METABOLIC PANEL
Anion gap: 6 (ref 5–15)
BUN: 7 mg/dL (ref 6–20)
CHLORIDE: 108 mmol/L (ref 101–111)
CO2: 26 mmol/L (ref 22–32)
CREATININE: 0.81 mg/dL (ref 0.44–1.00)
Calcium: 8.1 mg/dL — ABNORMAL LOW (ref 8.9–10.3)
GFR calc Af Amer: >60 mL/min (ref >60)
GFR calc non Af Amer: >60 mL/min (ref >60)
GLUCOSE: 104 mg/dL — AB (ref 65–99)
Potassium: 3.3 mmol/L — ABNORMAL LOW (ref 3.5–5.1)
SODIUM: 140 mmol/L (ref 135–145)

## 2015-06-01 LAB — IRON AND TIBC
Iron: 22 ug/dL — ABNORMAL LOW (ref 28–170)
SATURATION RATIOS: 8 % — AB (ref 10.4–31.8)
TIBC: 272 ug/dL (ref 250–450)
UIBC: 250 ug/dL

## 2015-06-01 LAB — VITAMIN B12: Vitamin B-12: 603 pg/mL (ref 180–914)

## 2015-06-01 LAB — FERRITIN: FERRITIN: 53 ng/mL (ref 11–307)

## 2015-06-01 LAB — RETICULOCYTES
RBC.: 2.85 MIL/uL — AB (ref 3.87–5.11)
RETIC CT PCT: 3.6 % — AB (ref 0.4–3.1)
Retic Count, Absolute: 102.6 10*3/uL (ref 19.0–186.0)

## 2015-06-01 LAB — FOLATE: Folate: 13.2 ng/mL (ref >5.9)

## 2015-06-01 MED ORDER — POTASSIUM CHLORIDE CRYS ER 20 MEQ PO TBCR
40.0000 meq | EXTENDED_RELEASE_TABLET | Freq: Two times a day (BID) | ORAL | Status: AC
  Administered 2015-06-01 (×2): 40 meq via ORAL
  Filled 2015-06-01 (×2): qty 2

## 2015-06-01 NOTE — Progress Notes (Signed)
[place] TEAM 1 - Stepdown/ICU TEAM PROGRESS NOTE  [name] [id number] DOB: 24-Jul-1945 DOA: 05/29/2015 PCP: Walker Kehr, MD  Admit HPI / Brief Narrative: 69 y.o. female with a history of anxiety who presented with melena for one week.  The patient had worsening dental pain over the preceding month for which she was taking 2 tablets of ibuprofen every 6 hours until she had a bloody bowel movement.  She called her gastroenterologist who recommended a follow-up appointment, and also her son (a physician) who recommended she stop NSAIDs. Despite following this advice she again had black sticky bowel movements several times. The day of her presentation she had a large frankly bloody bowel movement ("looked like a crime scene in there") and came to the ER.  In the ED, the patient was hemodynamically stable, had melena on exam that was hemoccult positive, and was noted to have hemoglobin 10.7 g/dL from previous 14 g/dL. While in the ER, the patient had 2-3 large frankly bloody stools, and developed orthostatic symptoms with sitting up as well as BP 90/60 mmHg while standing. Transfusion of PRBCs was administered as well as pantoprazole infusion and the patient was transferred to stepdown.   HPI/Subjective: The pt reports no further bleeding, but states she feels very weak and unstable on her feet.  She lives alone and is worried about going home today.  She denies cp, sob, n/v, or abdom pain.    Assessment/Plan:  UGIB v/s LGIB GI following - no clear source on EGD or colo - presumed diverticular source - avoid NSAIDs/ASA for 2weeks - tolerating advanced diet w/o difficulty  Acute blood loss anemia  Due to GIB - Hgb appears to have stabilized - check Fe studies to determine if loading w/ IV Fe will be beneficial   Multiple gastric polyps Noted on EGD - path reveals fundic gland polyps   Moderate diverticulosis  Noted on colo - discussed adding fiber to diet w/ pt, and  possibility of recurrent bleeding   Depression/anxiety Continue home citalopram  Dental pain  Adjust pain med and follow clinically - pt is connected w/ dentist for f/u after d/c   Mild hypokalemia Replace and recheck in AM   Deconditioning Pt does not appear to be safe for d/c to home today - cont to encourage ambulation - reassess in AM   Code Status: FULL Family Communication: no family present at time of exam Disposition Plan: prob d/c home in 24-48hrs  Consultants: [facility]  Procedures: 12/7 EGD -Few sessile polyps in the gastric body; multiple biopsies pending - 6 cm hiatal hernia 12/8 Colo - Moderate diverticulosis in the sigmoid colon and descending colon - Mild diverticulosis in the transverse colon  Antibiotics: none  DVT prophylaxis: SCDs  Objective: Blood pressure 131/69, pulse 91, temperature 99.2 F (37.3 C), temperature source Oral, resp. rate 20, height 5\' 4"  (1.626 m), weight 74 kg (163 lb 2.3 oz), SpO2 97 %.  Intake/Output Summary (Last 24 hours) at 06/01/15 1314 Last data filed at 06/01/15 0645  Gross per 24 hour  Intake      0 ml  Output    250 ml  Net   -250 ml   Exam: General: No acute respiratory distress - alert - flat affect  Lungs: Clear to auscultation bilaterally without wheezes or crackles Cardiovascular: Regular rate and rhythm without murmur gallop or rub  Abdomen: Nontender, nondistended, soft, bowel sounds positive, no rebound, no ascites, no appreciable mass Extremities: No significant cyanosis, clubbing,  edema bilateral lower extremities   Data Reviewed: Basic Metabolic Panel:  Recent Labs Lab 05/29/15 0100 05/30/15 0550 06/01/15 0431  NA 137 142 140  K 3.6 3.5 3.3*  CL 108 115* 108  CO2 19* 22 26  GLUCOSE 127* 97 104*  BUN 31* 14 7  CREATININE 0.93 0.68 0.81  CALCIUM 8.8* 7.4* 8.1*    CBC:  Recent Labs Lab 05/29/15 0100  05/29/15 1006 05/29/15 1524 05/30/15 0550 05/31/15 1105 06/01/15 0431  WBC 6.6   --   --  8.2 5.5 3.7* 3.8*  NEUTROABS 4.4  --   --   --   --   --   --   HGB 10.7*  < > 9.4* 8.4* 9.0* 8.5* 8.8*  HCT 31.5*  < > 27.3* 24.6* 26.1* 25.5* 25.7*  MCV 90.5  --   --  88.5 90.0 90.4 89.2  PLT 218  --   --  144* 126* 127* 130*  < > = values in this interval not displayed.  Liver Function Tests:  Recent Labs Lab 05/30/15 0550  AST 15  ALT 13*  ALKPHOS 37*  BILITOT 0.6  PROT 4.2*  ALBUMIN 2.6*   Coags:  Recent Labs Lab 05/29/15 1006  INR 1.34    Recent Results (from the past 240 hour(s))  MRSA PCR Screening     Status: None   Collection Time: 05/29/15  5:47 PM  Result Value Ref Range Status   MRSA by PCR NEGATIVE NEGATIVE Final    Comment:        The GeneXpert MRSA Assay (FDA approved for NASAL specimens only), is one component of a comprehensive MRSA colonization surveillance program. It is not intended to diagnose MRSA infection nor to guide or monitor treatment for MRSA infections.      Studies:   Recent x-ray studies have been reviewed in detail by the Attending Physician  Scheduled Meds:  Scheduled Meds: . citalopram  20 mg Oral Daily  . pantoprazole  40 mg Oral BID    Time spent on care of this patient: 25 mins   [facility] T , MD   [facility] Office  [phone] Pager - Text Page per Shea Evans as per below:  On-Call/Text Page:      [email or web address]      password [email or web address]  If 7PM-7AM, please contact night-coverage [email or web address] Password [email or web address] 06/01/2015, 1:14 PM   LOS: 3 days

## 2015-06-01 NOTE — Evaluation (Signed)
Physical Therapy Evaluation Patient Details Name: GENNA GAREAU MRN: [id number] DOB: [birth year] Today's Date: 06/01/2015   History of Present Illness  pt is a 69 y/o female with h/o IBS, DDD, diverticulosis, admitted with 1 week of melena.  Work up included units of PRBC's, colonoscopy and endoscopy.  Clinical Impression  Pt admitted with/for melena.  Pt currently limited functionally due to the problems listed below.  (see problems list.)  Pt will benefit from PT to maximize function and safety to be able to get home safely with available assist.     Follow Up Recommendations No PT follow up;Other (comment) (unless does not progress to independent level as expected.)    Equipment Recommendations  None recommended by PT    Recommendations for Other Services       Precautions / Restrictions Precautions Precautions: Fall Restrictions Weight Bearing Restrictions: No      Mobility  Bed Mobility Overal bed mobility: Modified Independent                Transfers Overall transfer level: Needs assistance   Transfers: Sit to/from Stand Sit to Stand: Supervision         General transfer comment: dizzy on standing for  ~5 sec  Ambulation/Gait Ambulation/Gait assistance: Min guard Ambulation Distance (Feet): 100 Feet   Gait Pattern/deviations: Step-through pattern Gait velocity: slower Gait velocity interpretation: Below normal speed for age/gender General Gait Details: mildly unsteady overall, looking as if her feet hurt.  no overt LOB though.  Dyspneic on exertion.  Stairs            Wheelchair Mobility    Modified Rankin (Stroke Patients Only)       Balance Overall balance assessment: Needs assistance Sitting-balance support: No upper extremity supported Sitting balance-Leahy Scale: Good     Standing balance support: No upper extremity supported Standing balance-Leahy Scale: Fair                               Pertinent  Vitals/Pain Pain Assessment: No/denies pain    Home Living Family/patient expects to be discharged to:: Private residence Living Arrangements: Alone Available Help at Discharge: Friend(s);Available PRN/intermittently Type of Home: House Home Access: Stairs to enter Entrance Stairs-Rails: Psychiatric nurse of Steps: several Home Layout: One level Home Equipment: Shower seat      Prior Function Level of Independence: Independent               Hand Dominance        Extremity/Trunk Assessment               Lower Extremity Assessment: Overall WFL for tasks assessed (notable weakness hip flexors/proximally)         Communication   Communication: No difficulties  Cognition Arousal/Alertness: Awake/alert Behavior During Therapy: WFL for tasks assessed/performed Overall Cognitive Status: Within Functional Limits for tasks assessed                      General Comments      Exercises        Assessment/Plan    PT Assessment Patient needs continued PT services  PT Diagnosis Abnormality of gait;Generalized weakness   PT Problem List Decreased strength;Decreased activity tolerance;Decreased balance;Decreased mobility  PT Treatment Interventions DME instruction;Gait training;Stair training;Functional mobility training;Therapeutic activities;Patient/family education   PT Goals (Current goals can be found in the Care Plan section) Acute Rehab PT Goals Patient Stated Goal: home  independent PT Goal Formulation: With patient Time For Goal Achievement: 06/15/15 Potential to Achieve Goals: Good    Frequency Min 3X/week   Barriers to discharge Decreased caregiver support      Co-evaluation               End of Session   Activity Tolerance: Patient tolerated treatment well;Patient limited by fatigue Patient left: in bed;with call bell/phone within reach Nurse Communication: Mobility status         Time: [id number] PT Time  Calculation (min) (ACUTE ONLY): 20 min   Charges:   PT Evaluation $Initial PT Evaluation Tier I: 1 Procedure     PT G Codes:        Shakemia Madera, Tessie Fass 06/01/2015, 11:38 AM 06/01/2015  Donnella Sham, PT [phone]  (pager)

## 2015-06-02 LAB — TYPE AND SCREEN
ABO/RH(D): A POS
Antibody Screen: NEGATIVE
UNIT DIVISION: 0
Unit division: 0
Unit division: 0

## 2015-06-02 LAB — BASIC METABOLIC PANEL
Anion gap: 5 (ref 5–15)
BUN: 8 mg/dL (ref 6–20)
CALCIUM: 8.1 mg/dL — AB (ref 8.9–10.3)
CO2: 27 mmol/L (ref 22–32)
CREATININE: 0.75 mg/dL (ref 0.44–1.00)
Chloride: 107 mmol/L (ref 101–111)
GFR calc Af Amer: >60 mL/min (ref >60)
GLUCOSE: 111 mg/dL — AB (ref 65–99)
POTASSIUM: 4 mmol/L (ref 3.5–5.1)
SODIUM: 139 mmol/L (ref 135–145)

## 2015-06-02 LAB — CBC
HCT: 23.3 % — ABNORMAL LOW (ref 36.0–46.0)
Hemoglobin: 8 g/dL — ABNORMAL LOW (ref 12.0–15.0)
MCH: 30.9 pg (ref 26.0–34.0)
MCHC: 34.3 g/dL (ref 30.0–36.0)
MCV: 90 fL (ref 78.0–100.0)
PLATELETS: 146 10*3/uL — AB (ref 150–400)
RBC: 2.59 MIL/uL — AB (ref 3.87–5.11)
RDW: 14.9 % (ref 11.5–15.5)
WBC: 4.3 10*3/uL (ref 4.0–10.5)

## 2015-06-02 MED ORDER — SENNOSIDES-DOCUSATE SODIUM 8.6-50 MG PO TABS
1.0000 | ORAL_TABLET | Freq: Two times a day (BID) | ORAL | Status: DC
  Administered 2015-06-02 – 2015-06-03 (×4): 1 via ORAL
  Filled 2015-06-02 (×4): qty 1

## 2015-06-02 MED ORDER — SODIUM CHLORIDE 0.9 % IV SOLN
510.0000 mg | Freq: Once | INTRAVENOUS | Status: AC
  Administered 2015-06-02: 510 mg via INTRAVENOUS
  Filled 2015-06-02: qty 17

## 2015-06-02 MED ORDER — POLYETHYLENE GLYCOL 3350 17 G PO PACK
17.0000 g | PACK | Freq: Every day | ORAL | Status: DC
  Administered 2015-06-03: 17 g via ORAL
  Filled 2015-06-02: qty 1

## 2015-06-02 NOTE — Progress Notes (Signed)
[place] TEAM 1 - Stepdown/ICU TEAM PROGRESS NOTE  MIKINLEY RYLE [id number] DOB: Aug 27, 1945 DOA: 05/29/2015 PCP: Walker Kehr, MD  Admit HPI / Brief Narrative: 69 y.o. female with a history of anxiety who presented with melena for one week.  The patient had worsening dental pain over the preceding month for which she was taking 2 tablets of ibuprofen every 6 hours until she had a bloody bowel movement.  She called her gastroenterologist who recommended a follow-up appointment, and also her son (a physician) who recommended she stop NSAIDs. Despite following this advice she again had black sticky bowel movements several times. The day of her presentation she had a large frankly bloody bowel movement ("looked like a crime scene in there") and came to the ER.  In the ED, the patient was hemodynamically stable, had melena on exam that was hemoccult positive, and was noted to have hemoglobin 10.7 g/dL from previous 14 g/dL. While in the ER, the patient had 2-3 large frankly bloody stools, and developed orthostatic symptoms with sitting up as well as BP 90/60 mmHg while standing. Transfusion of PRBCs was administered as well as pantoprazole infusion and the patient was transferred to stepdown.   HPI/Subjective: The pt walked 3 times yesterday.  She states she still feels weak and unsteady on her feet.  She denies cp, n/v, sob, or abdom pain.  She is constipated, and has not had a bowel movement today.    Assessment/Plan:  UGIB v/s LGIB GI has completed an evaluation - no clear source on EGD or colo - presumed diverticular source - avoid NSAIDs/ASA for 2weeks - tolerating advanced diet w/o difficulty - if bleeding recurs will require a nuc med bleeding scan +/- angio   Acute blood loss anemia  Due to GIB - Hgb appeared to have stabilized, but has declined again this morning  - Fe studies confirm Fe deficiency - load w/ IV Fe today - recheck Hgb in AM (or sooner if bleeding  recurs)  Multiple gastric polyps Noted on EGD - path reveals fundic gland polyps   Moderate diverticulosis  Noted on colo - discussed adding fiber to diet w/ pt, and possibility of recurrent bleeding   Depression/anxiety Continue home citalopram  Dental pain  Adjust pain med and follow clinically - pt is connected w/ dentist for f/u after d/c   Mild hypokalemia Replaced to normal   Deconditioning cont to encourage ambulation - reassess in AM - may need to ask PT/OT to reassess for ?CIR   Code Status: FULL Family Communication: no family present at time of exam Disposition Plan: prob d/c home in 24-48hrs  Consultants: [facility]  Procedures: 12/7 EGD -Few sessile polyps in the gastric body; multiple biopsies pending - 6 cm hiatal hernia 12/8 Colo - Moderate diverticulosis in the sigmoid colon and descending colon - Mild diverticulosis in the transverse colon  Antibiotics: none  DVT prophylaxis: SCDs  Objective: Blood pressure 111/58, pulse 89, temperature 98.2 F (36.8 C), temperature source Oral, resp. rate 18, height 5\' 4"  (1.626 m), weight 73.8 kg (162 lb 11.2 oz), SpO2 97 %.  Intake/Output Summary (Last 24 hours) at 06/02/15 1315 Last data filed at 06/01/15 2300  Gross per 24 hour  Intake    480 ml  Output    250 ml  Net    230 ml   Exam: General: No acute respiratory distress - flat affect  Lungs: Clear to auscultation bilaterally  Cardiovascular: Regular rate and rhythm without murmur  Abdomen: Nontender, nondistended, soft, bowel sounds positive, no rebound, no ascites, no appreciable mass Extremities: No significant cyanosis, clubbing, or edema bilateral lower extremities   Data Reviewed: Basic Metabolic Panel:  Recent Labs Lab 05/29/15 0100 05/30/15 0550 06/01/15 0431 06/02/15 0441  NA 137 142 140 139  K 3.6 3.5 3.3* 4.0  CL 108 115* 108 107  CO2 19* 22 26 27   GLUCOSE 127* 97 104* 111*  BUN 31* 14 7 8   CREATININE 0.93 0.68 0.81 0.75   CALCIUM 8.8* 7.4* 8.1* 8.1*    CBC:  Recent Labs Lab 05/29/15 0100  05/29/15 1524 05/30/15 0550 05/31/15 1105 06/01/15 0431 06/02/15 0441  WBC 6.6  --  8.2 5.5 3.7* 3.8* 4.3  NEUTROABS 4.4  --   --   --   --   --   --   HGB 10.7*  < > 8.4* 9.0* 8.5* 8.8* 8.0*  HCT 31.5*  < > 24.6* 26.1* 25.5* 25.7* 23.3*  MCV 90.5  --  88.5 90.0 90.4 89.2 90.0  PLT 218  --  144* 126* 127* 130* 146*  < > = values in this interval not displayed.  Liver Function Tests:  Recent Labs Lab 05/30/15 0550  AST 15  ALT 13*  ALKPHOS 37*  BILITOT 0.6  PROT 4.2*  ALBUMIN 2.6*   Coags:  Recent Labs Lab 05/29/15 1006  INR 1.34    Recent Results (from the past 240 hour(s))  MRSA PCR Screening     Status: None   Collection Time: 05/29/15  5:47 PM  Result Value Ref Range Status   MRSA by PCR NEGATIVE NEGATIVE Final    Comment:        The GeneXpert MRSA Assay (FDA approved for NASAL specimens only), is one component of a comprehensive MRSA colonization surveillance program. It is not intended to diagnose MRSA infection nor to guide or monitor treatment for MRSA infections.      Studies:   Recent x-ray studies have been reviewed in detail by the Attending Physician  Scheduled Meds:  Scheduled Meds: . citalopram  20 mg Oral Daily  . pantoprazole  40 mg Oral BID  . polyethylene glycol  17 g Oral Daily  . senna-docusate  1 tablet Oral BID    Time spent on care of this patient: 25 mins   [facility] T , MD   [facility] Office  [phone] Pager - Text Page per Shea Evans as per below:  On-Call/Text Page:      [email or web address]      password [email or web address]  If 7PM-7AM, please contact night-coverage [email or web address] Password [email or web address] 06/02/2015, 1:15 PM   LOS: 4 days

## 2015-06-03 LAB — CBC
HEMATOCRIT: 20.1 % — AB (ref 36.0–46.0)
HEMOGLOBIN: 6.7 g/dL — AB (ref 12.0–15.0)
MCH: 30.3 pg (ref 26.0–34.0)
MCHC: 33.3 g/dL (ref 30.0–36.0)
MCV: 91 fL (ref 78.0–100.0)
Platelets: 150 10*3/uL (ref 150–400)
RBC: 2.21 MIL/uL — ABNORMAL LOW (ref 3.87–5.11)
RDW: 15.4 % (ref 11.5–15.5)
WBC: 4.2 10*3/uL (ref 4.0–10.5)

## 2015-06-03 LAB — PREPARE RBC (CROSSMATCH)

## 2015-06-03 MED ORDER — POLYETHYLENE GLYCOL 3350 17 G PO PACK
17.0000 g | PACK | Freq: Two times a day (BID) | ORAL | Status: DC
  Administered 2015-06-03: 17 g via ORAL
  Filled 2015-06-03: qty 1

## 2015-06-03 MED ORDER — MAGNESIUM CITRATE PO SOLN: 0.5000 | Freq: Once | ORAL | Status: DC | PRN

## 2015-06-03 MED ORDER — PANTOPRAZOLE SODIUM 40 MG IV SOLR
40.0000 mg | Freq: Two times a day (BID) | INTRAVENOUS | Status: DC
  Administered 2015-06-03 – 2015-06-09 (×12): 40 mg via INTRAVENOUS
  Filled 2015-06-03 (×12): qty 40

## 2015-06-03 MED ORDER — SODIUM CHLORIDE 0.9 % IV SOLN: Freq: Once | INTRAVENOUS | Status: DC

## 2015-06-03 MED ORDER — SODIUM CHLORIDE 0.9 % IV SOLN
INTRAVENOUS | Status: DC
  Administered 2015-06-03: 17:00:00 via INTRAVENOUS
  Administered 2015-06-07: 1000 mL via INTRAVENOUS

## 2015-06-03 NOTE — Progress Notes (Signed)
PT Cancellation Note  Patient Details Name: [name] MRN: [id number] DOB: 11-30-1945   Cancelled Treatment:    Reason Eval/Treat Not Completed: Patient not medically ready.  Pt Hgb down in the 6's and to get unit of blood.  Will see as able 12/13. 06/03/2015  Donnella Sham, PT [phone]  (pager)   Irina Okelly, Tessie Fass 06/03/2015, 6:06 PM

## 2015-06-03 NOTE — Progress Notes (Signed)
MD paged about hemoglobin, hemoglobin is 6.7. Will continue to monitor.

## 2015-06-03 NOTE — Progress Notes (Signed)
OT Cancellation Note  Patient Details Name: [name] MRN: [id number] DOB: 11-20-1945   Cancelled Treatment:    Reason Eval/Treat Not Completed: Medical issues which prohibited therapy. Pt's hemoglobin 6.7 and has not received blood yet.  Benito Mccreedy OTR/L [id number] 06/03/2015, 1:27 PM

## 2015-06-03 NOTE — Care Management Note (Signed)
Case Management Note  Patient Details  Name: [name] MRN: [id number] Date of Birth: 1946/01/25  Subjective/Objective:      Date: 06/03/15 Spoke with patient at the bedside.  Introduced self as Tourist information centre manager and explained role in discharge planning and how to be reached.  Verified patient lives in town, alone. Expressed potential need for no other DME.  Verified patient anticipates to go  home alone, at time of discharge. Patient denied needing help with their medication.  Patient drives  to MD appointments.  Verified patient has PCP Aleksei Plotnikov. NCM left patient [place] list in her room to look over.  Plan: CM will continue to follow for discharge planning and [facility] resources.               Action/Plan:   Expected Discharge Date:                  Expected Discharge Plan:  Home/Self Care  In-House Referral:     Discharge planning Services  CM Consult  Post Acute Care Choice:    Choice offered to:     DME Arranged:    DME Agency:     HH Arranged:    HH Agency:     Status of Service:  In process, will continue to follow  Medicare Important Message Given:    Date Medicare IM Given:    Medicare IM give by:    Date Additional Medicare IM Given:    Additional Medicare Important Message give by:     If discussed at [place] of Stay Meetings, dates discussed:    Additional Comments:  Zenon Mayo, RN 06/03/2015, 5:00 PM

## 2015-06-03 NOTE — Progress Notes (Addendum)
[place] TEAM 1 - Stepdown/ICU TEAM PROGRESS NOTE  [name] [id number] DOB: 12/30/1945 DOA: 05/29/2015 PCP: Walker Kehr, MD  Admit HPI / Brief Narrative: 69 y.o. female with a history of anxiety who presented with melena for one week.  The patient had worsening dental pain over the preceding month for which she was taking 2 tablets of ibuprofen every 6 hours until she had a bloody bowel movement.  She called her gastroenterologist who recommended a follow-up appointment, and also her son (a physician) who recommended she stop NSAIDs. Despite following this advice she again had black sticky bowel movements several times. The day of her presentation she had a large frankly bloody bowel movement ("looked like a crime scene in there") and came to the ER.  In the ED, the patient was hemodynamically stable, had melena on exam that was hemoccult positive, and was noted to have hemoglobin 10.7 g/dL from previous 14 g/dL. While in the ER, the patient had 2-3 large frankly bloody stools, and developed orthostatic symptoms with sitting up as well as BP 90/60 mmHg while standing. Transfusion of PRBCs was administered as well as pantoprazole infusion and the patient was transferred to stepdown.   HPI/Subjective: The patient has not had a bowel movement and 36+ hours.  She denies chest pain shortness of breath lightheadedness dizziness or headache.  She denies nausea or vomiting.  She is quite anxious.   Overnight the patient's hemoglobin dropped from 8.0-6.7.  There has been no frank evidence of ongoing bleeding but then again the patient has not had a bowel movement and certainly GI bleeding could have recurred without outward evidence yet.  The patient remains hemodynamically stable at present.  Assessment/Plan:  UGIB v/s LGIB GI has completed an evaluation - no clear source on EGD or colo - presumed diverticular source - avoid NSAIDs/ASA for 2weeks - if bleeding recurs will require a  nuc med bleeding scan +/- angio   Acute blood loss anemia  Due to GIB - Hgb appeared to have stabilized, but has declined again  - Fe studies confirm Fe deficiency - loaded w/ IV Fe 12/11 - transfusing 1U PRBC today - check Hgb again tonight, early in the morning tomorrow, and again alter in the morning tomorrow - if recurrent bleeding is confirmed, a stat nuclear med bleeding scan will be obtained   Multiple gastric polyps Noted on EGD - path reveals fundic gland polyps   Moderate diverticulosis  Noted on colo - discussed adding fiber to diet w/ pt, and possibility of recurrent bleeding   Depression/anxiety Continue home citalopram - discussed w/ her at length the nature of her current illness   Dental pain  Appears stable - follow clinically - pt is connected w/ dentist for f/u after d/c   Mild hypokalemia Replaced to normal   Deconditioning cont to encourage ambulation  Code Status: FULL Family Communication: no family present at time of exam - called son Dr. Boykin Reaper at [phone] but did not get an answer (VM only) Disposition Plan: timing of d/c unclear due to possibility of ongoing GIB  Consultants: [facility]  Procedures: 12/7 EGD -Few sessile polyps in the gastric body; multiple biopsies pending - 6 cm hiatal hernia 12/8 Colo - Moderate diverticulosis in the sigmoid colon and descending colon - Mild diverticulosis in the transverse colon  Antibiotics: none  DVT prophylaxis: SCDs  Objective: Blood pressure 109/60, pulse 87, temperature 98.2 F (36.8 C), temperature source Oral, resp. rate 20, height 5'  4" (1.626 m), weight 73.8 kg (162 lb 11.2 oz), SpO2 95 %.  Intake/Output Summary (Last 24 hours) at 06/03/15 1603 Last data filed at 06/03/15 1421  Gross per 24 hour  Intake    210 ml  Output      0 ml  Net    210 ml   Exam: General: No acute respiratory distress - alert and conversant   Lungs: Clear to auscultation bilaterally w/o wheeze or  crackles  Cardiovascular: Regular rate and rhythm without murmur or gallup  Abdomen: Nontender, firm in midline suggestive of retained stool, soft, bowel sounds positive, no rebound, no ascites Extremities: No significant cyanosis, clubbing, or edema bilateral lower extremities   Data Reviewed: Basic Metabolic Panel:  Recent Labs Lab 05/29/15 0100 05/30/15 0550 06/01/15 0431 06/02/15 0441  NA 137 142 140 139  K 3.6 3.5 3.3* 4.0  CL 108 115* 108 107  CO2 19* 22 26 27   GLUCOSE 127* 97 104* 111*  BUN 31* 14 7 8   CREATININE 0.93 0.68 0.81 0.75  CALCIUM 8.8* 7.4* 8.1* 8.1*    CBC:  Recent Labs Lab 05/29/15 0100  05/30/15 0550 05/31/15 1105 06/01/15 0431 06/02/15 0441 06/03/15 0638  WBC 6.6  < > 5.5 3.7* 3.8* 4.3 4.2  NEUTROABS 4.4  --   --   --   --   --   --   HGB 10.7*  < > 9.0* 8.5* 8.8* 8.0* 6.7*  HCT 31.5*  < > 26.1* 25.5* 25.7* 23.3* 20.1*  MCV 90.5  < > 90.0 90.4 89.2 90.0 91.0  PLT 218  < > 126* 127* 130* 146* 150  < > = values in this interval not displayed.  Liver Function Tests:  Recent Labs Lab 05/30/15 0550  AST 15  ALT 13*  ALKPHOS 37*  BILITOT 0.6  PROT 4.2*  ALBUMIN 2.6*   Coags:  Recent Labs Lab 05/29/15 1006  INR 1.34    Recent Results (from the past 240 hour(s))  MRSA PCR Screening     Status: None   Collection Time: 05/29/15  5:47 PM  Result Value Ref Range Status   MRSA by PCR NEGATIVE NEGATIVE Final    Comment:        The GeneXpert MRSA Assay (FDA approved for NASAL specimens only), is one component of a comprehensive MRSA colonization surveillance program. It is not intended to diagnose MRSA infection nor to guide or monitor treatment for MRSA infections.      Studies:   Recent x-ray studies have been reviewed in detail by the Attending Physician  Scheduled Meds:  Scheduled Meds: . sodium chloride   Intravenous Once  . citalopram  20 mg Oral Daily  . pantoprazole  40 mg Oral BID  . polyethylene glycol  17 g  Oral Daily  . senna-docusate  1 tablet Oral BID    Time spent on care of this patient: 35 mins   Guerino Caporale T , MD   [facility] Office  [phone] Pager - Text Page per Shea Evans as per below:  On-Call/Text Page:      [email or web address]      password [email or web address]  If 7PM-7AM, please contact night-coverage [email or web address] Password [email or web address] 06/03/2015, 4:03 PM   LOS: 5 days

## 2015-06-04 ENCOUNTER — Inpatient Hospital Stay (HOSPITAL_COMMUNITY): Payer: Commercial Managed Care - HMO

## 2015-06-04 ENCOUNTER — Encounter: Payer: Commercial Managed Care - HMO | Admitting: Internal Medicine

## 2015-06-04 LAB — HEMOGLOBIN AND HEMATOCRIT, BLOOD
HCT: 23.4 % — ABNORMAL LOW (ref 36.0–46.0)
HEMATOCRIT: 23.7 % — AB (ref 36.0–46.0)
HEMATOCRIT: 25.3 % — AB (ref 36.0–46.0)
HEMOGLOBIN: 8.3 g/dL — AB (ref 12.0–15.0)
Hemoglobin: 7.9 g/dL — ABNORMAL LOW (ref 12.0–15.0)
Hemoglobin: 7.7 g/dL — ABNORMAL LOW (ref 12.0–15.0)

## 2015-06-04 LAB — COMPREHENSIVE METABOLIC PANEL
ALBUMIN: 2.7 g/dL — AB (ref 3.5–5.0)
ALT: 22 U/L (ref 14–54)
ANION GAP: 6 (ref 5–15)
AST: 28 U/L (ref 15–41)
Alkaline Phosphatase: 39 U/L (ref 38–126)
BUN: 13 mg/dL (ref 6–20)
CHLORIDE: 107 mmol/L (ref 101–111)
CO2: 26 mmol/L (ref 22–32)
Calcium: 8 mg/dL — ABNORMAL LOW (ref 8.9–10.3)
Creatinine, Ser: 0.78 mg/dL (ref 0.44–1.00)
GFR calc non Af Amer: >60 mL/min (ref >60)
GLUCOSE: 106 mg/dL — AB (ref 65–99)
Potassium: 3.9 mmol/L (ref 3.5–5.1)
SODIUM: 139 mmol/L (ref 135–145)
Total Bilirubin: 1 mg/dL (ref 0.3–1.2)
Total Protein: 4.7 g/dL — ABNORMAL LOW (ref 6.5–8.1)

## 2015-06-04 LAB — CBC
HCT: 24.4 % — ABNORMAL LOW (ref 36.0–46.0)
HEMOGLOBIN: 8.1 g/dL — AB (ref 12.0–15.0)
MCH: 30.3 pg (ref 26.0–34.0)
MCHC: 33.2 g/dL (ref 30.0–36.0)
MCV: 91.4 fL (ref 78.0–100.0)
Platelets: 159 10*3/uL (ref 150–400)
RBC: 2.67 MIL/uL — AB (ref 3.87–5.11)
RDW: 15.1 % (ref 11.5–15.5)
WBC: 6 10*3/uL (ref 4.0–10.5)

## 2015-06-04 LAB — PREPARE RBC (CROSSMATCH)

## 2015-06-04 LAB — PROTIME-INR
INR: 1.3 (ref 0.00–1.49)
Prothrombin Time: 16.3 seconds — ABNORMAL HIGH (ref 11.6–15.2)

## 2015-06-04 MED ORDER — SODIUM CHLORIDE 0.9 % IV SOLN: Freq: Once | INTRAVENOUS | Status: DC

## 2015-06-04 MED ORDER — TECHNETIUM TC 99M-LABELED RED BLOOD CELLS IV KIT
26.7000 | PACK | Freq: Once | INTRAVENOUS | Status: AC | PRN
  Administered 2015-06-04: 27 via INTRAVENOUS

## 2015-06-04 NOTE — Care Management Important Message (Signed)
Important Message  Patient Details  Name: [name] MRN: [id number] Date of Birth: [birth year]   Medicare Important Message Given:  Yes    [name] May 06/04/2015, 9:49 AM

## 2015-06-04 NOTE — Progress Notes (Signed)
Patient admitted with GI bleed had last bowel movement on 05/30/2015.  Miralax 17g  and senokot 1 tablet were given as ordered; patient now has bloody bowl movement.  On -call NP, Baltazar Najjar was notified.  Will continue to monitor patient.

## 2015-06-04 NOTE — Progress Notes (Signed)
Per NP Baltazar Najjar, bloody stools not a problem at this time due to labs of hemoglobin 8.1 and INR 1.3.  Baltazar Najjar, NP instructed nurse to call if patient has 2 or more bloody bowel movements.

## 2015-06-04 NOTE — Progress Notes (Addendum)
[facility]                                                                              Patient Demographics  [name], is a 69 y.o. female, DOB - December 15, 1945, [id number]  Admit date - 05/29/2015   Admitting Physician Edwin Dada, MD  Outpatient Primary MD for the patient is Walker Kehr, MD  LOS - 6   Chief Complaint  Patient presents with  . Rectal Bleeding       Brief HPI   69 y.o. female with a history of anxiety who presented with melena for one week. The patient had worsening dental pain over the preceding month for which she was taking 2 tablets of ibuprofen every 6 hours until she had a bloody bowel movement. She called her gastroenterologist who recommended a follow-up appointment, and also her son (a physician) who recommended she stop NSAIDs.Patient was admitted with GI bleed to stepdown  GI was consulted, patient underwent EGD on 12/7, showed a 6 cm hiatal hernia otherwise normal, continue PPI. colonoscopy on 12/8 which showed moderate diverticulosis in the sigmoid and descending colon, mild diabetic process in the transverse colon, recommended holding aspirin and all other NSAIDs, high fiber diet. Repeat coloscopy in 5 years. Patient was transferred to the floor on 06/03/15  Assessment & Plan   UGIB v/s LGIB - Gastroenterology was consulted, patient underwent endoscopy which showed 6 and a little hiatal hernia otherwise normal, continue PPI - Colonoscopy showed diverticulosis, recommended to hold aspirin and all other NSAIDs, high fiber diet.  - Overnight patient had bloody BM, hemoglobin 7.9, was transfused 1 unit packed RBCs. - tagged RBC scan showed equivocal area of activity in the right upper quadrant in the region of the hepatic flexure of the colon without obvious progression/movement -  D/w GI Blima Ledger- GI had signed off), recommended IR evaluation for angiography and embolization  - Continue clears Addendum:  4:20pm Spoke with IR, Ascencion Dike and Dr Earleen Newport, recommended CT angio abd and pelvis to target the area of bleeding. Ordered the imaging.  Acute blood loss anemia due to GI bleeding  - H/H 7.9 overnight, received 1 unit , hemoglobin 8.3 continue clears  - See #1   Multiple gastric polyps Noted on EGD - path reveals fundic gland polyp, continue PPIs   Moderate diverticulosis  Noted on  colonoscopy recommended high-fiber diet and possibility of recurrent bleeding.   Depression/anxiety Continue home citalopram   Dental pain  Appears stable - follow clinically - pt is connected with dentist for f/u after discharge  Deconditioning cont to encourage ambulation PTU evaluation   Code Status: full code  Family Communication: Discussed in detail with the patient, all imaging results, lab results explained to the patient    Disposition Plan:   Time Spent in minutes 25 minutes  Procedures  EGD Cscope Tagged RBC scan   Consults   GI   DVT Prophylaxis   SCD's  Medications  Scheduled Meds: . sodium chloride   Intravenous Once  . sodium chloride   Intravenous Once  . citalopram  20 mg Oral Daily  . pantoprazole (PROTONIX)  IV  40 mg Intravenous Q12H   Continuous Infusions: . sodium chloride Stopped (06/04/15 0524)   PRN Meds:.menthol-cetylpyridinium, oxyCODONE, traMADol   Antibiotics   Anti-infectives    None        Subjective:   [name] was seen and examined today.  Patient denies dizziness, chest pain, shortness of breath, abdominal pain, N/V/D/C, new weakness, numbess, tingling. Overnight issues noted above including bloody bowel movements   Objective:   Blood pressure 115/54, pulse 86, temperature 98.4 F (36.9 C), temperature source Oral, resp. rate 18, height 5\' 4"  (1.626 m), weight 73.8 kg (162 lb 11.2 oz), SpO2 98 %.  Wt Readings from Last 3 Encounters:  06/02/15 73.8 kg (162 lb 11.2 oz)  04/22/15 73.029 kg (161 lb)  03/28/15 74.208 kg  (163 lb 9.6 oz)     Intake/Output Summary (Last 24 hours) at 06/04/15 1341 Last data filed at 06/04/15 1322  Gross per 24 hour  Intake 1672.5 ml  Output      0 ml  Net 1672.5 ml    Exam  General: Alert and oriented x 3, NAD  HEENT:  PERRLA, EOMI, Anicteric Sclera, mucous membranes moist.   Neck: Supple, no JVD, no masses  CVS: S1 S2 auscultated, no rubs, murmurs or gallops. Regular rate and rhythm.  Respiratory: Clear to auscultation bilaterally, no wheezing, rales or rhonchi  Abdomen: Soft, nontender, nondistended, + bowel sounds  Ext: no cyanosis clubbing or edema  Neuro: AAOx3, Cr N's II- XII. Strength 5/5 upper and lower extremities bilaterally  Skin: No rashes  Psych: Normal affect and demeanor, alert and oriented x3    Data Review   Micro Results Recent Results (from the past 240 hour(s))  MRSA PCR Screening     Status: None   Collection Time: 05/29/15  5:47 PM  Result Value Ref Range Status   MRSA by PCR NEGATIVE NEGATIVE Final    Comment:        The GeneXpert MRSA Assay (FDA approved for NASAL specimens only), is one component of a comprehensive MRSA colonization surveillance program. It is not intended to diagnose MRSA infection nor to guide or monitor treatment for MRSA infections.     Radiology Reports Nm Gi Blood Loss  06/04/2015  CLINICAL DATA:  Possible upper GI bleed. Patient taking anti-inflammatories for the past 3 weeks. EXAM: NUCLEAR MEDICINE GASTROINTESTINAL BLEEDING SCAN TECHNIQUE: Sequential abdominal images were obtained following intravenous administration of Tc-40m labeled red blood cells. RADIOPHARMACEUTICALS:  27.0 MCi Tc-6m in-vitro labeled red cells. COMPARISON:  None. FINDINGS: There is a vague area of activity noted in the right upper quadrant just below the right kidney and lateral to the right iliac vein. I do not see any significant movement. This could be due to focal inflammation/diverticulitis, polyp, tumor or vascular  abnormality such as aneurysm or varices. It is possible it could be a small bleeding focus in the hepatic flexure region of the colon but I think it is unlikely. I do not see any obvious findings for an upper GI bleed. IMPRESSION: Equivocal area of activity noted in the right upper quadrant, in the region of the hepatic flexure of the colon without obvious progression/movement. This is unlikely an active bleeding site and is more likely a false positive due to inflammation, polyp, tumor or vascular abnormality. Electronically Signed   By: Marijo Sanes M.D.   On: 06/04/2015 08:55    CBC  Recent Labs Lab 05/29/15 0100  05/31/15 1105 06/01/15 0431 06/02/15 0441 06/03/15 [id number]  06/04/15 0141 06/04/15 0345 06/04/15 1050  WBC 6.6  < > 3.7* 3.8* 4.3 4.2 6.0  --   --   HGB 10.7*  < > 8.5* 8.8* 8.0* 6.7* 8.1* 7.9* 8.3*  HCT 31.5*  < > 25.5* 25.7* 23.3* 20.1* 24.4* 23.7* 25.3*  PLT 218  < > 127* 130* 146* 150 159  --   --   MCV 90.5  < > 90.4 89.2 90.0 91.0 91.4  --   --   MCH 30.7  < > 30.1 30.6 30.9 30.3 30.3  --   --   MCHC 34.0  < > 33.3 34.2 34.3 33.3 33.2  --   --   RDW 13.4  < > 14.6 14.5 14.9 15.4 15.1  --   --   LYMPHSABS 1.7  --   --   --   --   --   --   --   --   MONOABS 0.3  --   --   --   --   --   --   --   --   EOSABS 0.1  --   --   --   --   --   --   --   --   BASOSABS 0.0  --   --   --   --   --   --   --   --   < > = values in this interval not displayed.  Chemistries   Recent Labs Lab 05/29/15 0100 05/30/15 0550 06/01/15 0431 06/02/15 0441 06/04/15 0141  NA 137 142 140 139 139  K 3.6 3.5 3.3* 4.0 3.9  CL 108 115* 108 107 107  CO2 19* 22 26 27 26   GLUCOSE 127* 97 104* 111* 106*  BUN 31* 14 7 8 13   CREATININE 0.93 0.68 0.81 0.75 0.78  CALCIUM 8.8* 7.4* 8.1* 8.1* 8.0*  AST  --  15  --   --  28  ALT  --  13*  --   --  22  ALKPHOS  --  37*  --   --  39  BILITOT  --  0.6  --   --  1.0    ------------------------------------------------------------------------------------------------------------------ estimated creatinine clearance is 63.4 mL/min (by C-G formula based on Cr of 0.78). ------------------------------------------------------------------------------------------------------------------ No results for input(s): HGBA1C in the last 72 hours. ------------------------------------------------------------------------------------------------------------------ No results for input(s): CHOL, HDL, LDLCALC, TRIG, CHOLHDL, LDLDIRECT in the last 72 hours. ------------------------------------------------------------------------------------------------------------------ No results for input(s): TSH, T4TOTAL, T3FREE, THYROIDAB in the last 72 hours.  Invalid input(s): FREET3 ------------------------------------------------------------------------------------------------------------------  Recent Labs  06/01/15 1445  VITAMINB12 603  FOLATE 13.2  FERRITIN 53  TIBC 272  IRON 22*  RETICCTPCT 3.6*    Coagulation profile  Recent Labs Lab 05/29/15 1006 06/04/15 0141  INR 1.34 1.30    No results for input(s): DDIMER in the last 72 hours.  Cardiac Enzymes No results for input(s): CKMB, TROPONINI, MYOGLOBIN in the last 168 hours.  Invalid input(s): CK ------------------------------------------------------------------------------------------------------------------ Invalid input(s): POCBNP  No results for input(s): GLUCAP in the last 72 hours.   Allante Whitmire M.D. [facility] 06/04/2015, 1:41 PM  Pager: [phone] Between 7am to 7pm - call Pager - [phone]  After 7pm go to [email or web address] - password [email or web address]  Call night coverage person covering after 7pm

## 2015-06-04 NOTE — Progress Notes (Signed)
Physical Therapy Treatment Patient Details Name: [name] MRN: [id number] DOB: 1945-11-08 Today's Date: 06/04/2015    History of Present Illness pt is a 69 y.o. female with h/o IBS, DDD, diverticulosis, admitted with 1 week of melena.  Work up included units of PRBC's, colonoscopy and endoscopy.    PT Comments    Patient progressing slowly towards PT goals. Continues to fatigue during mobility limiting distance. Pt very unsteady today requiring need to hold onto rail for support and Min A for balance at times most notable with head turns or challenges. Might benefit from [employer] vs RW. Needs to navigate stairs prior to d/c. Will follow acutely.  Follow Up Recommendations  Home health PT;Supervision - Intermittent     Equipment Recommendations  None recommended by PT    Recommendations for Other Services       Precautions / Restrictions Precautions Precautions: Fall Restrictions Weight Bearing Restrictions: No    Mobility  Bed Mobility Overal bed mobility: Modified Independent                Transfers Overall transfer level: Needs assistance Equipment used: None Transfers: Sit to/from Stand Sit to Stand: Min guard         General transfer comment: Min guard for safety. Stood from [employer], from toilet x1. Sway noted in standing. No dizziness reported.   Ambulation/Gait Ambulation/Gait assistance: Min assist Ambulation Distance (Feet): 150 Feet Assistive device: None Gait Pattern/deviations: Step-through pattern;Decreased stride length;Staggering left;Staggering right Gait velocity: decreased Gait velocity interpretation: Below normal speed for age/gender General Gait Details: Unsteady gait with pt reaching for rail for support. MIght benefit from DME. Fatigues.No DOE noted.    Stairs            Wheelchair Mobility    Modified Rankin (Stroke Patients Only)       Balance Overall balance assessment: Needs assistance Sitting-balance support:  Feet supported;No upper extremity supported Sitting balance-Leahy Scale: Good     Standing balance support: During functional activity Standing balance-Leahy Scale: Fair Standing balance comment: Able to place pad in underwear in standing without LOB or difficulty.                     Cognition Arousal/Alertness: Awake/alert Behavior During Therapy: WFL for tasks assessed/performed Overall Cognitive Status: Within Functional Limits for tasks assessed                      Exercises      General Comments        Pertinent Vitals/Pain Pain Assessment: No/denies pain    Home Living Family/patient expects to be discharged to:: Private residence Living Arrangements: Alone Available Help at Discharge: Friend(s);Available PRN/intermittently Type of Home: House Home Access: Stairs to enter Entrance Stairs-Rails: Right;Left Home Layout: One level Home Equipment: Shower seat - built in      Prior Function Level of Independence: Independent          PT Goals (current goals can now be found in the care plan section) Acute Rehab PT Goals Patient Stated Goal: not stated Progress towards PT goals: Progressing toward goals    Frequency  Min 3X/week    PT Plan Current plan remains appropriate    Co-evaluation             End of Session Equipment Utilized During Treatment: Gait belt Activity Tolerance: Patient limited by fatigue Patient left: in bed;with call bell/phone within reach;with bed alarm set  Time: [id number] PT Time Calculation (min) (ACUTE ONLY): 17 min  Charges:  $Gait Training: 8-22 mins                    G Codes:      Prestina Raigoza A Jadyn Brasher 06/04/2015, 2:48 PM Wray Kearns, [place], DPT [phone]

## 2015-06-04 NOTE — Progress Notes (Signed)
OT Cancellation Note  Patient Details Name: MEILANIE BENNINGTON MRN: [id number] DOB: 1945-06-24   Cancelled Treatment:    Reason Eval/Treat Not Completed:  (Pt not feeling well and is tired.)  Benito Mccreedy  OTR/L [id number]  06/04/2015, 9:55 AM

## 2015-06-04 NOTE — Progress Notes (Signed)
Radiology called to verify NM GI blood loss imaging.  I was then told to call the MD if imaging to approve the STAT imaging.  Dr. Gerilyn Nestle approved of the STAT order.

## 2015-06-04 NOTE — Evaluation (Signed)
Occupational Therapy Evaluation Patient Details Name: [name] MRN: [id number] DOB: Nov 24, 1945 Today's Date: 06/04/2015    History of Present Illness pt is a 69 y.o. female with h/o IBS, DDD, diverticulosis, admitted with 1 week of melena.  Work up included units of PRBC's, colonoscopy and endoscopy.   Clinical Impression   Pt admitted with above. Pt independent with ADLs, PTA. Feel pt will benefit from acute OT to increase independence and strength prior to d/c.     Follow Up Recommendations  No OT follow up;Supervision/Assistance - 24 hour (may progress to intermittent assist/supervision)   Equipment Recommendations  None recommended by OT    Recommendations for Other Services       Precautions / Restrictions Precautions Precautions: Fall Restrictions Weight Bearing Restrictions: No      Mobility Bed Mobility Overal bed mobility: Modified Independent                Transfers Overall transfer level: Needs assistance   Transfers: Sit to/from Stand Sit to Stand: Supervision              Balance  LOB in hallway during ambulation-assist to regain balance.                                          ADL Overall ADL's : Needs assistance/impaired     Grooming: Applying deodorant;Standing;Min guard               Lower Body Dressing: Min guard;Sit to/from stand   Toilet Transfer: Minimal assistance;Ambulation (sit to stand from bed-supervision)           Functional mobility during ADLs: Minimal assistance (assist in hallway with balance) General ADL Comments: Educated on safety such as sitting for LB ADLs.  Pt reported she would be tempted to get up without nursing if she had to use bathroom-chair alarm pad placed under pt.     Vision     Perception     Praxis      Pertinent Vitals/Pain Pain Assessment: No/denies pain     Hand Dominance     Extremity/Trunk Assessment Upper Extremity Assessment Upper  Extremity Assessment: Overall WFL for tasks assessed   Lower Extremity Assessment Lower Extremity Assessment: Defer to PT evaluation       Communication Communication Communication: No difficulties   Cognition Arousal/Alertness: Awake/alert Behavior During Therapy: WFL for tasks assessed/performed Overall Cognitive Status: Within Functional Limits for tasks assessed                     General Comments       Exercises       Shoulder Instructions      Home Living Family/patient expects to be discharged to:: Private residence Living Arrangements: Alone Available Help at Discharge: Friend(s);Available PRN/intermittently Type of Home: House Home Access: Stairs to enter [employer] of Steps: several Entrance Stairs-Rails: Right;Left Home Layout: One level     Bathroom Shower/Tub: Walk-in shower;Tub/shower unit   [employer]: Standard     Home Equipment: Shower seat - built in          Prior Functioning/Environment Level of Independence: Independent             OT Diagnosis: Generalized weakness   OT Problem List: Decreased strength;Impaired balance (sitting and/or standing);Decreased knowledge of use of DME or AE;Decreased knowledge of precautions   OT Treatment/Interventions:  Self-care/ADL training;DME and/or AE instruction;Therapeutic activities;Patient/family education;Balance training    OT Goals(Current goals can be found in the care plan section) Acute Rehab OT Goals Patient Stated Goal: not stated OT Goal Formulation: With patient Time For Goal Achievement: 06/11/15 Potential to Achieve Goals: Good ADL Goals Pt Will Perform Lower Body Bathing: with supervision;sit to/from stand (including gathering items) Pt Will Perform Lower Body Dressing: with supervision;sit to/from stand (including gathering items) Pt Will Transfer to Toilet: with modified independence;regular height toilet;ambulating Pt Will Perform Tub/Shower Transfer:  Shower transfer;with set-up;with supervision;ambulating;shower seat  OT Frequency: Min 2X/week   Barriers to D/C:            Co-evaluation              End of Session Equipment Utilized During Treatment: Gait belt Nurse Communication: Mobility status (up in chair)  Activity Tolerance: Patient tolerated treatment well Patient left: in chair;with call bell/phone within reach;with family/visitor present;with chair alarm set   Time: [id number] OT Time Calculation (min): 12 min Charges:  OT General Charges $OT Visit: 1 Procedure OT Evaluation $Initial OT Evaluation Tier I: 1 Procedure G-CodesBenito Mccreedy OTR/L [id number] 06/04/2015, 10:57 AM

## 2015-06-05 ENCOUNTER — Inpatient Hospital Stay (HOSPITAL_COMMUNITY): Payer: Commercial Managed Care - HMO

## 2015-06-05 ENCOUNTER — Encounter (HOSPITAL_COMMUNITY): Payer: Self-pay | Admitting: Radiology

## 2015-06-05 DIAGNOSIS — F411 Generalized anxiety disorder: Secondary | ICD-10-CM

## 2015-06-05 LAB — BASIC METABOLIC PANEL
ANION GAP: 8 (ref 5–15)
BUN: 24 mg/dL — ABNORMAL HIGH (ref 6–20)
CALCIUM: 8.1 mg/dL — AB (ref 8.9–10.3)
CO2: 23 mmol/L (ref 22–32)
CREATININE: 0.81 mg/dL (ref 0.44–1.00)
Chloride: 106 mmol/L (ref 101–111)
Glucose, Bld: 97 mg/dL (ref 65–99)
Potassium: 3.6 mmol/L (ref 3.5–5.1)
SODIUM: 137 mmol/L (ref 135–145)

## 2015-06-05 LAB — CBC WITH DIFFERENTIAL/PLATELET
BASOS PCT: 1 %
Basophils Absolute: 0 10*3/uL (ref 0.0–0.1)
EOS ABS: 0.1 10*3/uL (ref 0.0–0.7)
EOS PCT: 1 %
HCT: 31.3 % — ABNORMAL LOW (ref 36.0–46.0)
HEMOGLOBIN: 10.4 g/dL — AB (ref 12.0–15.0)
Lymphocytes Relative: 25 %
Lymphs Abs: 1.8 10*3/uL (ref 0.7–4.0)
MCH: 30.1 pg (ref 26.0–34.0)
MCHC: 33.2 g/dL (ref 30.0–36.0)
MCV: 90.5 fL (ref 78.0–100.0)
Monocytes Absolute: 0.6 10*3/uL (ref 0.1–1.0)
Monocytes Relative: 8 %
NEUTROS PCT: 66 %
Neutro Abs: 4.9 10*3/uL (ref 1.7–7.7)
PLATELETS: 215 10*3/uL (ref 150–400)
RBC: 3.46 MIL/uL — AB (ref 3.87–5.11)
RDW: 16.4 % — ABNORMAL HIGH (ref 11.5–15.5)
WBC: 7.4 10*3/uL (ref 4.0–10.5)

## 2015-06-05 LAB — PREPARE RBC (CROSSMATCH)

## 2015-06-05 LAB — CBC
HCT: 22.4 % — ABNORMAL LOW (ref 36.0–46.0)
HEMOGLOBIN: 7.3 g/dL — AB (ref 12.0–15.0)
MCH: 30.3 pg (ref 26.0–34.0)
MCHC: 32.6 g/dL (ref 30.0–36.0)
MCV: 92.9 fL (ref 78.0–100.0)
PLATELETS: 193 10*3/uL (ref 150–400)
RBC: 2.41 MIL/uL — AB (ref 3.87–5.11)
RDW: 15.8 % — ABNORMAL HIGH (ref 11.5–15.5)
WBC: 6.1 10*3/uL (ref 4.0–10.5)

## 2015-06-05 MED ORDER — IOHEXOL 350 MG/ML SOLN
100.0000 mL | Freq: Once | INTRAVENOUS | Status: AC | PRN
  Administered 2015-06-05: 100 mL via INTRAVENOUS

## 2015-06-05 MED ORDER — LIDOCAINE HCL 1 % IJ SOLN
INTRAMUSCULAR | Status: AC
Start: 2015-06-05 — End: 2015-06-05
  Filled 2015-06-05: qty 20

## 2015-06-05 MED ORDER — MIDAZOLAM HCL 2 MG/2ML IJ SOLN
INTRAMUSCULAR | Status: AC | PRN
  Administered 2015-06-05: 1 mg via INTRAVENOUS

## 2015-06-05 MED ORDER — ACETAMINOPHEN 325 MG PO TABS
650.0000 mg | ORAL_TABLET | Freq: Once | ORAL | Status: AC
  Administered 2015-06-05: 650 mg via ORAL
  Filled 2015-06-05: qty 2

## 2015-06-05 MED ORDER — SODIUM CHLORIDE 0.9 % IV SOLN: Freq: Once | INTRAVENOUS | Status: AC

## 2015-06-05 MED ORDER — IOHEXOL 300 MG/ML  SOLN: 150.0000 mL | Freq: Once | INTRAMUSCULAR | Status: DC | PRN

## 2015-06-05 MED ORDER — FENTANYL CITRATE (PF) 100 MCG/2ML IJ SOLN
INTRAMUSCULAR | Status: AC
  Administered 2015-06-05: 12:00:00
  Filled 2015-06-05: qty 6

## 2015-06-05 MED ORDER — SODIUM CHLORIDE 0.9 % IV SOLN: Freq: Once | INTRAVENOUS | Status: DC

## 2015-06-05 MED ORDER — MIDAZOLAM HCL 2 MG/2ML IJ SOLN
INTRAMUSCULAR | Status: AC
Start: 2015-06-05 — End: 2015-06-05
  Administered 2015-06-05: 12:00:00
  Filled 2015-06-05: qty 6

## 2015-06-05 MED ORDER — SODIUM CHLORIDE 0.9 % IV SOLN
INTRAVENOUS | Status: AC | PRN
  Administered 2015-06-05: 10 mL/h via INTRAVENOUS

## 2015-06-05 MED ORDER — FENTANYL CITRATE (PF) 100 MCG/2ML IJ SOLN
INTRAMUSCULAR | Status: AC | PRN
  Administered 2015-06-05: 50 ug via INTRAVENOUS

## 2015-06-05 MED ORDER — LIDOCAINE HCL 1 % IJ SOLN
INTRAMUSCULAR | Status: AC
  Filled 2015-06-05: qty 20

## 2015-06-05 MED ORDER — IOHEXOL 300 MG/ML  SOLN
350.0000 mL | Freq: Once | INTRAMUSCULAR | Status: AC | PRN
  Administered 2015-06-05: 175 mL via INTRA_ARTERIAL

## 2015-06-05 MED ORDER — FUROSEMIDE 10 MG/ML IJ SOLN
20.0000 mg | Freq: Once | INTRAMUSCULAR | Status: AC
  Administered 2015-06-05: 20 mg via INTRAVENOUS
  Filled 2015-06-05: qty 2

## 2015-06-05 MED ORDER — DIPHENHYDRAMINE HCL 50 MG/ML IJ SOLN
25.0000 mg | Freq: Once | INTRAMUSCULAR | Status: AC
Start: 2015-06-05 — End: 2015-06-05
  Administered 2015-06-05: 25 mg via INTRAVENOUS
  Filled 2015-06-05: qty 1

## 2015-06-05 NOTE — Progress Notes (Signed)
PATIENT DETAILS Name: [name] Age: 69 y.o. Sex: female Date of Birth: [birth year] Admit Date: 05/29/2015 Admitting Physician Edwin Dada, MD [id number] Plotnikov, MD  Subjective: Hematochezia 2 this morning.  Assessment/Plan: Principal Problem: Diverticular bleeding: Unfortunately continues to have stuttering lower GI bleed. Colonoscopy on 12/8 shows diverticulosis, EGD negative for bleeding foci. Tagged RBC scan who was equally vocal, however a CTA abdomen 12/14 was suggestive of acute hemorrhage in the splenic flexure area. Mesenteric angiogram on 12/14 was negative. Since continues to bleed, I have consulted general surgery.  Active Problems: Acute blood loss anemia: Secondary to above, transfused 2 units of PRBC today. Follow CBC.  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  Disposition: Remain inpatient  Antimicrobial agents  See below  Anti-infectives    None      DVT Prophylaxis: SCD's  Code Status: Full code   Family Communication called son Dr. Boykin Reaper at [phone]   Procedures: EGD 12/7 Colonoscopy 12/8 Mesentric Angio 12/14  CONSULTS:  GI, general surgery and IR  Time spent 30 minutes-Greater than 50% of this time was spent in counseling, explanation of diagnosis, planning of further management, and coordination of care.  MEDICATIONS: Scheduled Meds: . sodium chloride   Intravenous Once  . sodium chloride   Intravenous Once  . sodium chloride   Intravenous Once  . sodium chloride   Intravenous Once  . citalopram  20 mg Oral Daily  . furosemide  20 mg Intravenous Once  . lidocaine      . pantoprazole (PROTONIX) IV  40 mg Intravenous Q12H   Continuous Infusions: . sodium chloride Stopped (06/04/15 0524)  . sodium chloride 10 mL/hr (06/05/15 1125)   PRN Meds:.sodium chloride, fentaNYL,  menthol-cetylpyridinium, midazolam, oxyCODONE, traMADol    PHYSICAL EXAM: Vital signs in last 24 hours: Filed Vitals:   06/05/15 1244 06/05/15 1444 06/05/15 1539 06/05/15 1608  BP: 116/71 112/58 102/59 94/53  Pulse: 92 89 93 93  Temp:  98.2 F (36.8 C) 98.2 F (36.8 C) 98.4 F (36.9 C)  TempSrc:  Oral Oral Oral  Resp: 18 16 22 24   Height:      Weight:      SpO2: 94% 98% 97% 96%    Weight change:  Filed Weights   05/30/15 1023 05/31/15 1757 06/02/15 0437  Weight: 72.576 kg (160 lb) 74 kg (163 lb 2.3 oz) 73.8 kg (162 lb 11.2 oz)   Body mass index is 27.91 kg/(m^2).   Gen Exam: Awake and alert with clear speech.   Neck: Supple, No JVD.   Chest: B/L Clear.   CVS: S1 S2 Regular Abdomen: soft, BS +, non tender, non distended.  Extremities: no edema, lower extremities warm to touch. Neurologic: Non Focal.   Skin: No Rash.   Wounds: N/A.    Intake/Output from previous day:  Intake/Output Summary (Last 24 hours) at 06/05/15 1617 Last data filed at 06/05/15 1553  Gross per 24 hour  Intake    375 ml  Output      0 ml  Net    375 ml     LAB RESULTS: CBC  Recent Labs Lab 06/01/15 0431 06/02/15 0441 06/03/15 0638 06/04/15 0141 06/04/15 0345 06/04/15 1050 06/04/15 1528 06/05/15 0445  WBC 3.8* 4.3 4.2 6.0  --   --   --  6.1  HGB 8.8* 8.0* 6.7* 8.1* 7.9* 8.3* 7.7* 7.3*  HCT 25.7* 23.3* 20.1* 24.4* 23.7* 25.3* 23.4* 22.4*  PLT 130* 146* 150 159  --   --   --  193  MCV 89.2 90.0 91.0 91.4  --   --   --  92.9  MCH 30.6 30.9 30.3 30.3  --   --   --  30.3  MCHC 34.2 34.3 33.3 33.2  --   --   --  32.6  RDW 14.5 14.9 15.4 15.1  --   --   --  15.8*    Chemistries   Recent Labs Lab 05/30/15 0550 06/01/15 0431 06/02/15 0441 06/04/15 0141 06/05/15 0445  NA 142 140 139 139 137  K 3.5 3.3* 4.0 3.9 3.6  CL 115* 108 107 107 106  CO2 22 26 27 26 23   GLUCOSE 97 104* 111* 106* 97  BUN 14 7 8 13  24*  CREATININE 0.68 0.81 0.75 0.78 0.81  CALCIUM 7.4* 8.1* 8.1* 8.0*  8.1*    CBG: No results for input(s): GLUCAP in the last 168 hours.  GFR Estimated Creatinine Clearance: 62.7 mL/min (by C-G formula based on Cr of 0.81).  Coagulation profile  Recent Labs Lab 06/04/15 0141  INR 1.30    Cardiac Enzymes No results for input(s): CKMB, TROPONINI, MYOGLOBIN in the last 168 hours.  Invalid input(s): CK  Invalid input(s): POCBNP No results for input(s): DDIMER in the last 72 hours. No results for input(s): HGBA1C in the last 72 hours. No results for input(s): CHOL, HDL, LDLCALC, TRIG, CHOLHDL, LDLDIRECT in the last 72 hours. No results for input(s): TSH, T4TOTAL, T3FREE, THYROIDAB in the last 72 hours.  Invalid input(s): FREET3 No results for input(s): VITAMINB12, FOLATE, FERRITIN, TIBC, IRON, RETICCTPCT in the last 72 hours. No results for input(s): LIPASE, AMYLASE in the last 72 hours.  Urine Studies No results for input(s): UHGB, CRYS in the last 72 hours.  Invalid input(s): UACOL, UAPR, USPG, UPH, UTP, UGL, UKET, UBIL, UNIT, UROB, ULEU, UEPI, UWBC, URBC, UBAC, CAST, UCOM, BILUA  MICROBIOLOGY: Recent Results (from the past 240 hour(s))  MRSA PCR Screening     Status: None   Collection Time: 05/29/15  5:47 PM  Result Value Ref Range Status   MRSA by PCR NEGATIVE NEGATIVE Final    Comment:        The GeneXpert MRSA Assay (FDA approved for NASAL specimens only), is one component of a comprehensive MRSA colonization surveillance program. It is not intended to diagnose MRSA infection nor to guide or monitor treatment for MRSA infections.     RADIOLOGY STUDIES/RESULTS: Nm Gi Blood Loss  06/04/2015  CLINICAL DATA:  Possible upper GI bleed. Patient taking anti-inflammatories for the past 3 weeks. EXAM: NUCLEAR MEDICINE GASTROINTESTINAL BLEEDING SCAN TECHNIQUE: Sequential abdominal images were obtained following intravenous administration of Tc-27m labeled red blood cells. RADIOPHARMACEUTICALS:  27.0 MCi Tc-8m in-vitro labeled red  cells. COMPARISON:  None. FINDINGS: There is a vague area of activity noted in the right upper quadrant just below the right kidney and lateral to the right iliac vein. I do not see any significant movement. This could be due to focal inflammation/diverticulitis, polyp, tumor or vascular abnormality such as aneurysm or varices. It is possible it could be a small bleeding focus in the hepatic flexure region of the colon but I think it is unlikely. I do not see any obvious findings for an upper GI bleed. IMPRESSION: Equivocal area of activity noted in the right  upper quadrant, in the region of the hepatic flexure of the colon without obvious progression/movement. This is unlikely an active bleeding site and is more likely a false positive due to inflammation, polyp, tumor or vascular abnormality. Electronically Signed   By: Marijo Sanes M.D.   On: 06/04/2015 08:55   Ir Angiogram Visceral Selective  06/05/2015  CLINICAL DATA:  GI bleed. Tagged red cell scintigraphy showed a Focal area of activity in the right upper abdomen. CTA suggested acute hemorrhage in into the lumen of the splenic flexure of the colon. Descending and sigmoid diverticulosis. EXAM: SELECTIVE VISCERAL ARTERIOGRAPHY; IR ULTRASOUND GUIDANCE VASC ACCESS RIGHT ANESTHESIA/SEDATION: Intravenous Fentanyl and Versed were administered as conscious sedation during continuous cardiorespiratory monitoring by the radiology RN, with a total moderate sedation time of 40 minutes. MEDICATIONS: Lidocaine 1% subcutaneous CONTRAST:  148mL OMNIPAQUE IOHEXOL 300 MG/ML  SOLN PROCEDURE: The procedure, risks (including but not limited to bleeding, infection, organ damage ), benefits, and alternatives were explained to the patient. Questions regarding the procedure were encouraged and answered. The patient understands and consents to the procedure. Right femoral region prepped and draped in usual sterile fashion. Maximal barrier sterile technique was utilized including  caps, mask, sterile gowns, sterile gloves, sterile drape, hand hygiene and skin antiseptic. The right common femoral artery was localized under ultrasound. Under real-time ultrasound guidance, the vessel was accessed with a 21-gauge micropuncture needle, exchanged over a 018 guidewire for a transitional dilator, through which a 035 guidewire was advanced. Over this, a 5 [place] vascular sheath was placed, through which a 5 [place] C2 catheter was advanced and used to selectively catheterize the superior mesenteric artery for selective arteriography in multiple projections. The C2 was then utilized to selectively catheterize the inferior mesenteric artery for selective arteriography in multiple projections. The C2 was then utilized to selectively catheterize the celiac axis for selective arteriography. The catheter and sheath were removed and hemostasis achieved with the aid of the Exoseal device after confirmatory femoral arteriography. The patient tolerated the procedure well. COMPLICATIONS: None immediate FINDINGS: No evidence of active extravasation, early draining vein, AVM, or other lesion to suggest a site or etiology of the patient's GI bleeding. Replaced right hepatic arterial supply from the superior mesenteric artery, an anatomic variant. Venous phase confirms patency of the IMV and portal venous system. IMPRESSION: 1. Negative three-vessel mesenteric arteriogram. No evidence of active extravasation or other focal lesion to suggest etiology of GI bleed. Electronically Signed   By: Lucrezia Europe M.D.   On: 06/05/2015 13:49   Ir Angiogram Visceral Selective  06/05/2015  CLINICAL DATA:  GI bleed. Tagged red cell scintigraphy showed a Focal area of activity in the right upper abdomen. CTA suggested acute hemorrhage in into the lumen of the splenic flexure of the colon. Descending and sigmoid diverticulosis. EXAM: SELECTIVE VISCERAL ARTERIOGRAPHY; IR ULTRASOUND GUIDANCE VASC ACCESS RIGHT ANESTHESIA/SEDATION:  Intravenous Fentanyl and Versed were administered as conscious sedation during continuous cardiorespiratory monitoring by the radiology RN, with a total moderate sedation time of 40 minutes. MEDICATIONS: Lidocaine 1% subcutaneous CONTRAST:  160mL OMNIPAQUE IOHEXOL 300 MG/ML  SOLN PROCEDURE: The procedure, risks (including but not limited to bleeding, infection, organ damage ), benefits, and alternatives were explained to the patient. Questions regarding the procedure were encouraged and answered. The patient understands and consents to the procedure. Right femoral region prepped and draped in usual sterile fashion. Maximal barrier sterile technique was utilized including caps, mask, sterile gowns, sterile gloves, sterile drape, hand hygiene and skin antiseptic.  The right common femoral artery was localized under ultrasound. Under real-time ultrasound guidance, the vessel was accessed with a 21-gauge micropuncture needle, exchanged over a 018 guidewire for a transitional dilator, through which a 035 guidewire was advanced. Over this, a 5 [place] vascular sheath was placed, through which a 5 [place] C2 catheter was advanced and used to selectively catheterize the superior mesenteric artery for selective arteriography in multiple projections. The C2 was then utilized to selectively catheterize the inferior mesenteric artery for selective arteriography in multiple projections. The C2 was then utilized to selectively catheterize the celiac axis for selective arteriography. The catheter and sheath were removed and hemostasis achieved with the aid of the Exoseal device after confirmatory femoral arteriography. The patient tolerated the procedure well. COMPLICATIONS: None immediate FINDINGS: No evidence of active extravasation, early draining vein, AVM, or other lesion to suggest a site or etiology of the patient's GI bleeding. Replaced right hepatic arterial supply from the superior mesenteric artery, an anatomic variant.  Venous phase confirms patency of the IMV and portal venous system. IMPRESSION: 1. Negative three-vessel mesenteric arteriogram. No evidence of active extravasation or other focal lesion to suggest etiology of GI bleed. Electronically Signed   By: Lucrezia Europe M.D.   On: 06/05/2015 13:49   Ir Angiogram Visceral Selective  06/05/2015  CLINICAL DATA:  GI bleed. Tagged red cell scintigraphy showed a Focal area of activity in the right upper abdomen. CTA suggested acute hemorrhage in into the lumen of the splenic flexure of the colon. Descending and sigmoid diverticulosis. EXAM: SELECTIVE VISCERAL ARTERIOGRAPHY; IR ULTRASOUND GUIDANCE VASC ACCESS RIGHT ANESTHESIA/SEDATION: Intravenous Fentanyl and Versed were administered as conscious sedation during continuous cardiorespiratory monitoring by the radiology RN, with a total moderate sedation time of 40 minutes. MEDICATIONS: Lidocaine 1% subcutaneous CONTRAST:  144mL OMNIPAQUE IOHEXOL 300 MG/ML  SOLN PROCEDURE: The procedure, risks (including but not limited to bleeding, infection, organ damage ), benefits, and alternatives were explained to the patient. Questions regarding the procedure were encouraged and answered. The patient understands and consents to the procedure. Right femoral region prepped and draped in usual sterile fashion. Maximal barrier sterile technique was utilized including caps, mask, sterile gowns, sterile gloves, sterile drape, hand hygiene and skin antiseptic. The right common femoral artery was localized under ultrasound. Under real-time ultrasound guidance, the vessel was accessed with a 21-gauge micropuncture needle, exchanged over a 018 guidewire for a transitional dilator, through which a 035 guidewire was advanced. Over this, a 5 [place] vascular sheath was placed, through which a 5 [place] C2 catheter was advanced and used to selectively catheterize the superior mesenteric artery for selective arteriography in multiple projections. The C2 was  then utilized to selectively catheterize the inferior mesenteric artery for selective arteriography in multiple projections. The C2 was then utilized to selectively catheterize the celiac axis for selective arteriography. The catheter and sheath were removed and hemostasis achieved with the aid of the Exoseal device after confirmatory femoral arteriography. The patient tolerated the procedure well. COMPLICATIONS: None immediate FINDINGS: No evidence of active extravasation, early draining vein, AVM, or other lesion to suggest a site or etiology of the patient's GI bleeding. Replaced right hepatic arterial supply from the superior mesenteric artery, an anatomic variant. Venous phase confirms patency of the IMV and portal venous system. IMPRESSION: 1. Negative three-vessel mesenteric arteriogram. No evidence of active extravasation or other focal lesion to suggest etiology of GI bleed. Electronically Signed   By: Lucrezia Europe M.D.   On: 06/05/2015 13:49  Ir US Guide Vasc Access Right  06/05/2015  CLINICAL DATA:  GI bleed. Tagged red cell scintigraphy showed a Focal area of activity in the right upper abdomen. CTA suggested acute hemorrhage in into the lumen of the splenic flexure of the colon. Descending and sigmoid diverticulosis. EXAM: SELECTIVE VISCERAL ARTERIOGRAPHY; IR ULTRASOUND GUIDANCE VASC ACCESS RIGHT ANESTHESIA/SEDATION: Intravenous Fentanyl and Versed were administered as conscious sedation during continuous cardiorespiratory monitoring by the radiology RN, with a total moderate sedation time of 40 minutes. MEDICATIONS: Lidocaine 1% subcutaneous CONTRAST:  145mL OMNIPAQUE IOHEXOL 300 MG/ML  SOLN PROCEDURE: The procedure, risks (including but not limited to bleeding, infection, organ damage ), benefits, and alternatives were explained to the patient. Questions regarding the procedure were encouraged and answered. The patient understands and consents to the procedure. Right femoral region prepped and  draped in usual sterile fashion. Maximal barrier sterile technique was utilized including caps, mask, sterile gowns, sterile gloves, sterile drape, hand hygiene and skin antiseptic. The right common femoral artery was localized under ultrasound. Under real-time ultrasound guidance, the vessel was accessed with a 21-gauge micropuncture needle, exchanged over a 018 guidewire for a transitional dilator, through which a 035 guidewire was advanced. Over this, a 5 [place] vascular sheath was placed, through which a 5 [place] C2 catheter was advanced and used to selectively catheterize the superior mesenteric artery for selective arteriography in multiple projections. The C2 was then utilized to selectively catheterize the inferior mesenteric artery for selective arteriography in multiple projections. The C2 was then utilized to selectively catheterize the celiac axis for selective arteriography. The catheter and sheath were removed and hemostasis achieved with the aid of the Exoseal device after confirmatory femoral arteriography. The patient tolerated the procedure well. COMPLICATIONS: None immediate FINDINGS: No evidence of active extravasation, early draining vein, AVM, or other lesion to suggest a site or etiology of the patient's GI bleeding. Replaced right hepatic arterial supply from the superior mesenteric artery, an anatomic variant. Venous phase confirms patency of the IMV and portal venous system. IMPRESSION: 1. Negative three-vessel mesenteric arteriogram. No evidence of active extravasation or other focal lesion to suggest etiology of GI bleed. Electronically Signed   By: Lucrezia Europe M.D.   On: 06/05/2015 13:49   Ct Angio Abd/pel W/ And/or W/o  06/05/2015  CLINICAL DATA:  Acute onset of GI bleeding. Bloody bowel movements. Decreased hemoglobin. Initial encounter. EXAM: CTA ABDOMEN AND PELVIS wITHOUT AND WITH CONTRAST TECHNIQUE: Multidetector CT imaging of the abdomen and pelvis was performed using the  standard protocol during bolus administration of intravenous contrast. Multiplanar reconstructed images and MIPs were obtained and reviewed to evaluate the vascular anatomy. CONTRAST:  175mL OMNIPAQUE IOHEXOL 350 MG/ML SOLN COMPARISON:  CT of the abdomen and pelvis from 09/18/2010 FINDINGS: Mild bibasilar atelectasis is noted. A moderate hiatal hernia is noted. A small amount of contrast is noted within the splenic flexure of the colon, compatible with acute hemorrhage into the splenic flexure of the colon. The abdominal aorta is grossly unremarkable in appearance, without calcific atherosclerotic disease. The celiac trunk, superior mesenteric artery, bilateral renal arteries and inferior mesenteric artery remain fully patent. The inferior vena cava is unremarkable in appearance. The liver and spleen are unremarkable in appearance. The patient is status post cholecystectomy, with clips noted at the gallbladder fossa. The pancreas and adrenal glands are unremarkable. A 3 mm stone is noted at the interpole region of the right kidney. A 2 mm stone is noted at the upper pole of the left kidney.  The kidneys are otherwise unremarkable. There is no evidence of hydronephrosis. No obstructing ureteral stones are identified. There is Richter herniation of the ascending colon into a moderate to large right posterior flank hernia, without evidence of obstruction. Scattered diverticulosis is noted along the descending and sigmoid colon, without evidence of diverticulitis. No free fluid is identified. The small bowel is unremarkable in appearance. The stomach is within normal limits. No acute vascular abnormalities are seen. The appendix is normal in caliber and contains air, without evidence of appendicitis. The bladder is mildly distended and grossly unremarkable. Postoperative change is noted along the anterior pelvic wall. The patient is status post hysterectomy. The ovaries are relatively symmetric. No suspicious adnexal  masses are seen. No inguinal lymphadenopathy is seen. No acute osseous abnormalities are identified. There is mild grade 1 anterolisthesis of L4 on L5, reflecting underlying facet disease. Review of the MIP images confirms the above findings. IMPRESSION: 1. Acute hemorrhage noted into the splenic flexure of the colon, corresponding to the patient's GI bleeding. 2. Moderate hiatal hernia noted. 3. Scattered diverticulosis along the descending and sigmoid colon, without evidence of diverticulitis. 4. Richter herniation of the ascending colon into a moderate to large right posterior flank hernia, without evidence of obstruction. 5. Small nonobstructing bilateral renal stones, measuring up to 3 mm in size. 6. Mild bibasilar atelectasis noted. These results were called by telephone at the time of interpretation on 06/05/2015 at 3:16 am to [facility] on [id number], who verbally acknowledged these results. Electronically Signed   By: Garald Balding M.D.   On: 06/05/2015 03:31    Oren Binet, MD  [facility] Pager:[phone]  If 7PM-7AM, please contact night-coverage [email or web address] Password [email or web address] 06/05/2015, 4:17 PM   LOS: 7 days

## 2015-06-05 NOTE — Procedures (Signed)
Mesenteric arteriogram 3-vessel:  Negative  No complication No blood loss. See complete dictation in [facility] PACS.  

## 2015-06-05 NOTE — Sedation Documentation (Signed)
Patient denies pain and is resting comfortably.  

## 2015-06-05 NOTE — Sedation Documentation (Signed)
Patient is resting comfortably. 

## 2015-06-05 NOTE — Progress Notes (Signed)
Chief Complaint: Patient was seen in consultation today for  Chief Complaint  Patient presents with  . Rectal Bleeding   at the request of  Dr. Tana Coast  Referring Physician(s): Dr. Tana Coast  History of Present Illness: [name] is a 69 y.o. female with GI bleed with source unknown. Chart reviewed, multiple bloody BMs including early this am, though she says they were dark, possible clots. Has had several units PRBC transfusion EGD essentially negative, colonoscopy suggest diverticular, but no specific site of acut bleed identified. Had NM RBC scan suggest possible hepatic flexure activity, but equivocal. Had CTA last pm, showing contrast into splenic flexure colon suggesting source of hemorrhage. IR is asked to eval for angiogram and possible embolization. Chart, PMHx, meds, labs, imaging reviewed. Has been NPO this am No coagulopathies.  Past Medical History  Diagnosis Date  . GERD (gastroesophageal reflux disease) 2011    Hiatal hernia, nodular fundus on EGD  . Diverticulosis of colon (without mention of hemorrhage) 2002    left colon/sigmoid.   . Irritable bowel syndrome 2002    chronic diarrhea. biopsies neg for celiac, neg for microscopic/collagenous colitis   . Anal fissure   . Degeneration of intervertebral disc, site unspecified   . Other and unspecified ovarian cyst   . Depression with anxiety   . Bacterial overgrowth syndrome 2002  . Other B-complex deficiencies   . History of colon polyps 08/24/2011    hyperplastic 2013, adenomatous before 2002  . Anemia, unspecified   . Internal hemorrhoids 04/2015    pruritus ani.     Past Surgical History  Procedure Laterality Date  . Cholecystectomy  2003  . Abdominal hysterectomy  1972  . Cesarean section  1971  . Bladder suspension  [phone]  . Breast enhancement surgery  1980  . Abdominoplasty  1978  . Appendectomy    . Esophagogastroduodenoscopy N/A 05/29/2015    Procedure: ESOPHAGOGASTRODUODENOSCOPY  (EGD);  Surgeon: Ladene Artist, MD;  Location: [facility] ENDOSCOPY;  Service: Endoscopy;  Laterality: N/A;  . Colonoscopy N/A 05/30/2015    Procedure: COLONOSCOPY;  Surgeon: Ladene Artist, MD;  Location: [facility] ENDOSCOPY;  Service: Endoscopy;  Laterality: N/A;    Allergies: Bupropion hcl; Fluoxetine hcl; Latex; Propoxyphene n-acetaminophen; Venlafaxine; and Nickel  Medications:   Current facility-administered medications:  .  0.9 %  sodium chloride infusion, , Intravenous, Once, Cherene Altes, MD .  0.9 %  sodium chloride infusion, , Intravenous, Continuous, Cherene Altes, MD, Stopped at 06/04/15 [phone] .  0.9 %  sodium chloride infusion, , Intravenous, Once, Ripudeep K Rai, MD .  0.9 %  sodium chloride infusion, , Intravenous, Once, Karsten Fells Kirby-Graham, NP .  0.9 %  sodium chloride infusion, , Intravenous, Once, Shanker Kristeen Mans, MD .  acetaminophen (TYLENOL) tablet 650 mg, 650 mg, Oral, Once, Shanker Kristeen Mans, MD .  citalopram (CELEXA) tablet 20 mg, 20 mg, Oral, Daily, Cherene Altes, MD, 20 mg at 06/04/15 1148 .  diphenhydrAMINE (BENADRYL) injection 25 mg, 25 mg, Intravenous, Once, Shanker Kristeen Mans, MD .  furosemide (LASIX) injection 20 mg, 20 mg, Intravenous, Once, Shanker Kristeen Mans, MD .  menthol-cetylpyridinium (CEPACOL) lozenge 3 mg, 1 lozenge, Oral, PRN, Cherene Altes, MD .  oxyCODONE (Oxy IR/ROXICODONE) immediate release tablet 5 mg, 5 mg, Oral, Q4H PRN, Cherene Altes, MD .  pantoprazole (PROTONIX) injection 40 mg, 40 mg, Intravenous, Q12H, Cherene Altes, MD, 40 mg at 06/04/15 2143 .  traMADol (ULTRAM) tablet 50  mg, 50 mg, Oral, Q6H PRN, Cherene Altes, MD, 50 mg at 06/04/15 2143   Family History  Problem Relation Age of Onset  . Hypertension Father   . Coronary artery disease Father   . Colon cancer Paternal Grandfather     Paternal greatfather  . Lung cancer Paternal Uncle   . Diabetes Father     Social History   Social History  . Marital Status:  Divorced    Spouse Name: N/A  . Number of Children: 3  . Years of Education: N/A   Occupational History  . retired    Social History Main Topics  . Smoking status: Never Smoker   . Smokeless tobacco: Never Used  . Alcohol Use: 1.8 oz/week    0 Standard drinks or equivalent, 3 Glasses of wine per week  . Drug Use: No  . Sexual Activity: Not Asked   Other Topics Concern  . None   Social History Narrative    Review of Systems: A 12 point ROS discussed and pertinent positives are indicated in the HPI above.  All other systems are negative.  Review of Systems  Vital Signs: BP 104/53 mmHg  Pulse 89  Temp(Src) 98.1 F (36.7 C) (Oral)  Resp 24  Ht 5\' 4"  (1.626 m)  Wt 162 lb 11.2 oz (73.8 kg)  BMI 27.91 kg/m2  SpO2 94%  Physical Exam  Constitutional: She is oriented to person, place, and time. She appears well-developed and well-nourished. No distress.  HENT:  Head: Normocephalic.  Mouth/Throat: Oropharynx is clear and moist.  Neck: Normal range of motion. No tracheal deviation present.  Cardiovascular: Normal rate, regular rhythm, normal heart sounds and intact distal pulses.   Pulmonary/Chest: Effort normal and breath sounds normal. No respiratory distress.  Abdominal: Soft. She exhibits no distension. There is no tenderness.  Neurological: She is alert and oriented to person, place, and time.  Psychiatric: She has a normal mood and affect. Judgment normal.    Mallampati Score:  MD Evaluation Airway: WNL Heart: WNL Abdomen: WNL Chest/ Lungs: WNL ASA  Classification: 3 Mallampati/Airway Score: Two  Imaging: Nm Gi Blood Loss  06/04/2015  CLINICAL DATA:  Possible upper GI bleed. Patient taking anti-inflammatories for the past 3 weeks. EXAM: NUCLEAR MEDICINE GASTROINTESTINAL BLEEDING SCAN TECHNIQUE: Sequential abdominal images were obtained following intravenous administration of Tc-21m labeled red blood cells. RADIOPHARMACEUTICALS:  27.0 MCi Tc-27m in-vitro  labeled red cells. COMPARISON:  None. FINDINGS: There is a vague area of activity noted in the right upper quadrant just below the right kidney and lateral to the right iliac vein. I do not see any significant movement. This could be due to focal inflammation/diverticulitis, polyp, tumor or vascular abnormality such as aneurysm or varices. It is possible it could be a small bleeding focus in the hepatic flexure region of the colon but I think it is unlikely. I do not see any obvious findings for an upper GI bleed. IMPRESSION: Equivocal area of activity noted in the right upper quadrant, in the region of the hepatic flexure of the colon without obvious progression/movement. This is unlikely an active bleeding site and is more likely a false positive due to inflammation, polyp, tumor or vascular abnormality. Electronically Signed   By: Marijo Sanes M.D.   On: 06/04/2015 08:55   Ct Angio Abd/pel W/ And/or W/o  06/05/2015  CLINICAL DATA:  Acute onset of GI bleeding. Bloody bowel movements. Decreased hemoglobin. Initial encounter. EXAM: CTA ABDOMEN AND PELVIS wITHOUT AND WITH CONTRAST TECHNIQUE:  Multidetector CT imaging of the abdomen and pelvis was performed using the standard protocol during bolus administration of intravenous contrast. Multiplanar reconstructed images and MIPs were obtained and reviewed to evaluate the vascular anatomy. CONTRAST:  135mL OMNIPAQUE IOHEXOL 350 MG/ML SOLN COMPARISON:  CT of the abdomen and pelvis from 09/18/2010 FINDINGS: Mild bibasilar atelectasis is noted. A moderate hiatal hernia is noted. A small amount of contrast is noted within the splenic flexure of the colon, compatible with acute hemorrhage into the splenic flexure of the colon. The abdominal aorta is grossly unremarkable in appearance, without calcific atherosclerotic disease. The celiac trunk, superior mesenteric artery, bilateral renal arteries and inferior mesenteric artery remain fully patent. The inferior vena cava  is unremarkable in appearance. The liver and spleen are unremarkable in appearance. The patient is status post cholecystectomy, with clips noted at the gallbladder fossa. The pancreas and adrenal glands are unremarkable. A 3 mm stone is noted at the interpole region of the right kidney. A 2 mm stone is noted at the upper pole of the left kidney. The kidneys are otherwise unremarkable. There is no evidence of hydronephrosis. No obstructing ureteral stones are identified. There is Richter herniation of the ascending colon into a moderate to large right posterior flank hernia, without evidence of obstruction. Scattered diverticulosis is noted along the descending and sigmoid colon, without evidence of diverticulitis. No free fluid is identified. The small bowel is unremarkable in appearance. The stomach is within normal limits. No acute vascular abnormalities are seen. The appendix is normal in caliber and contains air, without evidence of appendicitis. The bladder is mildly distended and grossly unremarkable. Postoperative change is noted along the anterior pelvic wall. The patient is status post hysterectomy. The ovaries are relatively symmetric. No suspicious adnexal masses are seen. No inguinal lymphadenopathy is seen. No acute osseous abnormalities are identified. There is mild grade 1 anterolisthesis of L4 on L5, reflecting underlying facet disease. Review of the MIP images confirms the above findings. IMPRESSION: 1. Acute hemorrhage noted into the splenic flexure of the colon, corresponding to the patient's GI bleeding. 2. Moderate hiatal hernia noted. 3. Scattered diverticulosis along the descending and sigmoid colon, without evidence of diverticulitis. 4. Richter herniation of the ascending colon into a moderate to large right posterior flank hernia, without evidence of obstruction. 5. Small nonobstructing bilateral renal stones, measuring up to 3 mm in size. 6. Mild bibasilar atelectasis noted. These results  were called by telephone at the time of interpretation on 06/05/2015 at 3:16 am to [facility] on [id number], who verbally acknowledged these results. Electronically Signed   By: Garald Balding M.D.   On: 06/05/2015 03:31    Labs:  CBC:  Recent Labs  06/02/15 0441 06/03/15 0638 06/04/15 0141 06/04/15 0345 06/04/15 1050 06/04/15 1528 06/05/15 0445  WBC 4.3 4.2 6.0  --   --   --  6.1  HGB 8.0* 6.7* 8.1* 7.9* 8.3* 7.7* 7.3*  HCT 23.3* 20.1* 24.4* 23.7* 25.3* 23.4* 22.4*  PLT 146* 150 159  --   --   --  193    COAGS:  Recent Labs  05/29/15 1006 06/04/15 0141  INR 1.34 1.30    BMP:  Recent Labs  06/01/15 0431 06/02/15 0441 06/04/15 0141 06/05/15 0445  NA 140 139 139 137  K 3.3* 4.0 3.9 3.6  CL 108 107 107 106  CO2 26 27 26 23   GLUCOSE 104* 111* 106* 97  BUN 7 8 13  24*  CALCIUM 8.1* 8.1* 8.0* 8.1*  CREATININE 0.81 0.75 0.78 0.81  GFRNONAA >60 >60 >60 >60  GFRAA >60 >60 >60 >60    LIVER FUNCTION TESTS:  Recent Labs  08/29/14 1450 03/20/15 1554 05/30/15 0550 06/04/15 0141  BILITOT 0.4 0.4 0.6 1.0  AST 14 14 15 28   ALT 12 11 13* 22  ALKPHOS 68 70 37* 39  PROT 6.9 7.1 4.2* 4.7*  ALBUMIN 4.2 4.3 2.6* 2.7*    TUMOR MARKERS: No results for input(s): AFPTM, CEA, CA199, CHROMGRNA in the last 8760 hours.  Assessment and Plan: GI bleed, possible colonic(splenic flexure) source Will proceed with mesenteric arteriogram and possible embolization of source vessel. Risks and Benefits discussed with the patient including, but not limited to bleeding, infection, vascular injury or contrast induced renal failure. All of the patient's questions were answered, patient is agreeable to proceed. Consent signed and in chart.   Thank you for this interesting consult.   A copy of this report was sent to the requesting provider on this date.  SignedAscencion Dike 06/05/2015, 9:44 AM   I spent a total of 20 Minutes  in face to face in clinical consultation, greater than  50% of which was counseling/coordinating care for GI bleed/mesenteric angiogram

## 2015-06-05 NOTE — Progress Notes (Signed)
Paged in regards to result of CT scan. Awaiting return page

## 2015-06-05 NOTE — Progress Notes (Signed)
Pt here with GIB. Tag study was equivocal, so IR suggested CT angio abd/pelvis to help pinpoint area of bleed. Results of CT back during night showing an acute hemorrhage of splenic flexure colon. Stat CBC was ordered since pt was hemodynamically stable. Per RN, pt had 3 bloody BMs during the night. Hgb down to 7.3 (was 7.9), so one unit PRBCs ordered. This NP called Dr. Reesa Chew of IR to make him aware of CT results in case procedure was needed today.  Dr. Reesa Chew stated he would pass the results along to the oncoming IR doc. Pt has been NPO since CT resulted. Clear liquids prior to that. Will report off to oncoming attending of [facility] at 0700.  Clance Boll, NP [facility]

## 2015-06-05 NOTE — Consult Note (Signed)
Reason for Consult:lower GI bleed Referring Physician: Dr. Oren Binet  [name] is an 69 y.o. female.  HPI: This is an unfortunate 69 year old female with a persistent, slow, gastrointestinal bleed. She initially presented with a weeklong history of melena. She has had at least 5 units of packed red blood cells since admission. Both upper and lower endoscopy were inconclusive except for multiple diverticuli. A bleeding scan was equivocal. A CT angiogram showed extravasation of contrast at the splenic flexure. IR performed a selective arteriogram but found no source of extravasation and cannot perform an embolization procedure. I been asked by Dr. Sloan Leiter to consult on the patient to consider a partial colectomy. Currently she is without complaints. She has remained hemodynamically stable throughout. She has no previous history of GI bleed.  Past Medical History  Diagnosis Date  . GERD (gastroesophageal reflux disease) 2011    Hiatal hernia, nodular fundus on EGD  . Diverticulosis of colon (without mention of hemorrhage) 2002    left colon/sigmoid.   . Irritable bowel syndrome 2002    chronic diarrhea. biopsies neg for celiac, neg for microscopic/collagenous colitis   . Anal fissure   . Degeneration of intervertebral disc, site unspecified   . Other and unspecified ovarian cyst   . Depression with anxiety   . Bacterial overgrowth syndrome 2002  . Other B-complex deficiencies   . History of colon polyps 08/24/2011    hyperplastic 2013, adenomatous before 2002  . Anemia, unspecified   . Internal hemorrhoids 04/2015    pruritus ani.     Past Surgical History  Procedure Laterality Date  . Cholecystectomy  2003  . Abdominal hysterectomy  1972  . Cesarean section  1971  . Bladder suspension  [phone]  . Breast enhancement surgery  1980  . Abdominoplasty  1978  . Appendectomy    . Esophagogastroduodenoscopy N/A 05/29/2015    Procedure: ESOPHAGOGASTRODUODENOSCOPY (EGD);   Surgeon: Ladene Artist, MD;  Location: [facility] ENDOSCOPY;  Service: Endoscopy;  Laterality: N/A;  . Colonoscopy N/A 05/30/2015    Procedure: COLONOSCOPY;  Surgeon: Ladene Artist, MD;  Location: [facility] ENDOSCOPY;  Service: Endoscopy;  Laterality: N/A;    Family History  Problem Relation Age of Onset  . Hypertension Father   . Coronary artery disease Father   . Colon cancer Paternal Grandfather     Paternal greatfather  . Lung cancer Paternal Uncle   . Diabetes Father     Social History:  reports that she has never smoked. She has never used smokeless tobacco. She reports that she drinks about 1.8 oz of alcohol per week. She reports that she does not use illicit drugs.  Allergies:  Allergies  Allergen Reactions  . Bupropion Hcl     REACTION: head buzzing  . Fluoxetine Hcl     REACTION: anxious  . Latex   . Propoxyphene N-Acetaminophen   . Venlafaxine     REACTION: anxious  . Nickel Rash    Medications: I have reviewed the patient's current medications.  Results for orders placed or performed during the hospital encounter of 05/29/15 (from the past 48 hour(s))  Comprehensive metabolic panel     Status: Abnormal   Collection Time: 06/04/15  1:41 AM  Result Value Ref Range   Sodium 139 135 - 145 mmol/L   Potassium 3.9 3.5 - 5.1 mmol/L   Chloride 107 101 - 111 mmol/L   CO2 26 22 - 32 mmol/L   Glucose, Bld 106 (H) 65 - 99 mg/dL  BUN 13 6 - 20 mg/dL   Creatinine, Ser 0.78 0.44 - 1.00 mg/dL   Calcium 8.0 (L) 8.9 - 10.3 mg/dL   Total Protein 4.7 (L) 6.5 - 8.1 g/dL   Albumin 2.7 (L) 3.5 - 5.0 g/dL   AST 28 15 - 41 U/L   ALT 22 14 - 54 U/L   Alkaline Phosphatase 39 38 - 126 U/L   Total Bilirubin 1.0 0.3 - 1.2 mg/dL   GFR calc non Af Amer >60 >60 mL/min   GFR calc Af Amer >60 >60 mL/min    Comment: (NOTE) The eGFR has been calculated using the CKD EPI equation. This calculation has not been validated in all clinical situations. eGFR's persistently <60 mL/min signify possible  Chronic Kidney Disease.    Anion gap 6 5 - 15  CBC     Status: Abnormal   Collection Time: 06/04/15  1:41 AM  Result Value Ref Range   WBC 6.0 4.0 - 10.5 K/uL   RBC 2.67 (L) 3.87 - 5.11 MIL/uL   Hemoglobin 8.1 (L) 12.0 - 15.0 g/dL   HCT 24.4 (L) 36.0 - 46.0 %   MCV 91.4 78.0 - 100.0 fL   MCH 30.3 26.0 - 34.0 pg   MCHC 33.2 30.0 - 36.0 g/dL   RDW 15.1 11.5 - 15.5 %   Platelets 159 150 - 400 K/uL  Protime-INR     Status: Abnormal   Collection Time: 06/04/15  1:41 AM  Result Value Ref Range   Prothrombin Time 16.3 (H) 11.6 - 15.2 seconds   INR 1.30 0.00 - 1.49  Hemoglobin and hematocrit, blood     Status: Abnormal   Collection Time: 06/04/15  3:45 AM  Result Value Ref Range   Hemoglobin 7.9 (L) 12.0 - 15.0 g/dL   HCT 23.7 (L) 36.0 - 46.0 %  Prepare RBC     Status: None   Collection Time: 06/04/15  8:34 AM  Result Value Ref Range   Order Confirmation ORDER PROCESSED BY BLOOD BANK   Hemoglobin and hematocrit, blood     Status: Abnormal   Collection Time: 06/04/15 10:50 AM  Result Value Ref Range   Hemoglobin 8.3 (L) 12.0 - 15.0 g/dL   HCT 25.3 (L) 36.0 - 46.0 %  Hemoglobin and hematocrit, blood     Status: Abnormal   Collection Time: 06/04/15  3:28 PM  Result Value Ref Range   Hemoglobin 7.7 (L) 12.0 - 15.0 g/dL   HCT 23.4 (L) 36.0 - 16.1 %  Basic metabolic panel     Status: Abnormal   Collection Time: 06/05/15  4:45 AM  Result Value Ref Range   Sodium 137 135 - 145 mmol/L   Potassium 3.6 3.5 - 5.1 mmol/L   Chloride 106 101 - 111 mmol/L   CO2 23 22 - 32 mmol/L   Glucose, Bld 97 65 - 99 mg/dL   BUN 24 (H) 6 - 20 mg/dL   Creatinine, Ser 0.81 0.44 - 1.00 mg/dL   Calcium 8.1 (L) 8.9 - 10.3 mg/dL   GFR calc non Af Amer >60 >60 mL/min   GFR calc Af Amer >60 >60 mL/min    Comment: (NOTE) The eGFR has been calculated using the CKD EPI equation. This calculation has not been validated in all clinical situations. eGFR's persistently <60 mL/min signify possible Chronic  Kidney Disease.    Anion gap 8 5 - 15  CBC     Status: Abnormal   Collection Time: 06/05/15  4:45  AM  Result Value Ref Range   WBC 6.1 4.0 - 10.5 K/uL   RBC 2.41 (L) 3.87 - 5.11 MIL/uL   Hemoglobin 7.3 (L) 12.0 - 15.0 g/dL   HCT 22.4 (L) 36.0 - 46.0 %   MCV 92.9 78.0 - 100.0 fL   MCH 30.3 26.0 - 34.0 pg   MCHC 32.6 30.0 - 36.0 g/dL   RDW 15.8 (H) 11.5 - 15.5 %   Platelets 193 150 - 400 K/uL  Prepare RBC     Status: None   Collection Time: 06/05/15  5:57 AM  Result Value Ref Range   Order Confirmation ORDER PROCESSED BY BLOOD BANK   Prepare RBC     Status: None   Collection Time: 06/05/15  7:59 AM  Result Value Ref Range   Order Confirmation      ORDER PROCESSED BY BLOOD BANK BLOOD ALREADY AVAILABLE    Nm Gi Blood Loss  06/04/2015  CLINICAL DATA:  Possible upper GI bleed. Patient taking anti-inflammatories for the past 3 weeks. EXAM: NUCLEAR MEDICINE GASTROINTESTINAL BLEEDING SCAN TECHNIQUE: Sequential abdominal images were obtained following intravenous administration of Tc-59mlabeled red blood cells. RADIOPHARMACEUTICALS:  27.0 MCi [id number]-vitro labeled red cells. COMPARISON:  None. FINDINGS: There is a vague area of activity noted in the right upper quadrant just below the right kidney and lateral to the right iliac vein. I do not see any significant movement. This could be due to focal inflammation/diverticulitis, polyp, tumor or vascular abnormality such as aneurysm or varices. It is possible it could be a small bleeding focus in the hepatic flexure region of the colon but I think it is unlikely. I do not see any obvious findings for an upper GI bleed. IMPRESSION: Equivocal area of activity noted in the right upper quadrant, in the region of the hepatic flexure of the colon without obvious progression/movement. This is unlikely an active bleeding site and is more likely a false positive due to inflammation, polyp, tumor or vascular abnormality. Electronically Signed   By: P.Marijo Sanes.D.   On: 06/04/2015 08:55   Ir Angiogram Visceral Selective  06/05/2015  CLINICAL DATA:  GI bleed. Tagged red cell scintigraphy showed a Focal area of activity in the right upper abdomen. CTA suggested acute hemorrhage in into the lumen of the splenic flexure of the colon. Descending and sigmoid diverticulosis. EXAM: SELECTIVE VISCERAL ARTERIOGRAPHY; IR ULTRASOUND GUIDANCE VASC ACCESS RIGHT ANESTHESIA/SEDATION: Intravenous Fentanyl and Versed were administered as conscious sedation during continuous cardiorespiratory monitoring by the radiology RN, with a total moderate sedation time of 40 minutes. MEDICATIONS: Lidocaine 1% subcutaneous CONTRAST:  17567mMNIPAQUE IOHEXOL 300 MG/ML  SOLN PROCEDURE: The procedure, risks (including but not limited to bleeding, infection, organ damage ), benefits, and alternatives were explained to the patient. Questions regarding the procedure were encouraged and answered. The patient understands and consents to the procedure. Right femoral region prepped and draped in usual sterile fashion. Maximal barrier sterile technique was utilized including caps, mask, sterile gowns, sterile gloves, sterile drape, hand hygiene and skin antiseptic. The right common femoral artery was localized under ultrasound. Under real-time ultrasound guidance, the vessel was accessed with a 21-gauge micropuncture needle, exchanged over a 018 guidewire for a transitional dilator, through which a 035 guidewire was advanced. Over this, a 5 Fre[place]cular sheath was placed, through which a 5 [place] catheter was advanced and used to selectively catheterize the superior mesenteric artery for selective arteriography in multiple projections. The C2 was then utilized  to selectively catheterize the inferior mesenteric artery for selective arteriography in multiple projections. The C2 was then utilized to selectively catheterize the celiac axis for selective arteriography. The catheter and sheath  were removed and hemostasis achieved with the aid of the Exoseal device after confirmatory femoral arteriography. The patient tolerated the procedure well. COMPLICATIONS: None immediate FINDINGS: No evidence of active extravasation, early draining vein, AVM, or other lesion to suggest a site or etiology of the patient's GI bleeding. Replaced right hepatic arterial supply from the superior mesenteric artery, an anatomic variant. Venous phase confirms patency of the IMV and portal venous system. IMPRESSION: 1. Negative three-vessel mesenteric arteriogram. No evidence of active extravasation or other focal lesion to suggest etiology of GI bleed. Electronically Signed   By: Lucrezia Europe M.D.   On: 06/05/2015 13:49   Ir Angiogram Visceral Selective  06/05/2015  CLINICAL DATA:  GI bleed. Tagged red cell scintigraphy showed a Focal area of activity in the right upper abdomen. CTA suggested acute hemorrhage in into the lumen of the splenic flexure of the colon. Descending and sigmoid diverticulosis. EXAM: SELECTIVE VISCERAL ARTERIOGRAPHY; IR ULTRASOUND GUIDANCE VASC ACCESS RIGHT ANESTHESIA/SEDATION: Intravenous Fentanyl and Versed were administered as conscious sedation during continuous cardiorespiratory monitoring by the radiology RN, with a total moderate sedation time of 40 minutes. MEDICATIONS: Lidocaine 1% subcutaneous CONTRAST:  180m OMNIPAQUE IOHEXOL 300 MG/ML  SOLN PROCEDURE: The procedure, risks (including but not limited to bleeding, infection, organ damage ), benefits, and alternatives were explained to the patient. Questions regarding the procedure were encouraged and answered. The patient understands and consents to the procedure. Right femoral region prepped and draped in usual sterile fashion. Maximal barrier sterile technique was utilized including caps, mask, sterile gowns, sterile gloves, sterile drape, hand hygiene and skin antiseptic. The right common femoral artery was localized under ultrasound.  Under real-time ultrasound guidance, the vessel was accessed with a 21-gauge micropuncture needle, exchanged over a 018 guidewire for a transitional dilator, through which a 035 guidewire was advanced. Over this, a 5 FP[place]vascular sheath was placed, through which a 5 [place]C2 catheter was advanced and used to selectively catheterize the superior mesenteric artery for selective arteriography in multiple projections. The C2 was then utilized to selectively catheterize the inferior mesenteric artery for selective arteriography in multiple projections. The C2 was then utilized to selectively catheterize the celiac axis for selective arteriography. The catheter and sheath were removed and hemostasis achieved with the aid of the Exoseal device after confirmatory femoral arteriography. The patient tolerated the procedure well. COMPLICATIONS: None immediate FINDINGS: No evidence of active extravasation, early draining vein, AVM, or other lesion to suggest a site or etiology of the patient's GI bleeding. Replaced right hepatic arterial supply from the superior mesenteric artery, an anatomic variant. Venous phase confirms patency of the IMV and portal venous system. IMPRESSION: 1. Negative three-vessel mesenteric arteriogram. No evidence of active extravasation or other focal lesion to suggest etiology of GI bleed. Electronically Signed   By: DLucrezia EuropeM.D.   On: 06/05/2015 13:49   Ir Angiogram Visceral Selective  06/05/2015  CLINICAL DATA:  GI bleed. Tagged red cell scintigraphy showed a Focal area of activity in the right upper abdomen. CTA suggested acute hemorrhage in into the lumen of the splenic flexure of the colon. Descending and sigmoid diverticulosis. EXAM: SELECTIVE VISCERAL ARTERIOGRAPHY; IR ULTRASOUND GUIDANCE VASC ACCESS RIGHT ANESTHESIA/SEDATION: Intravenous Fentanyl and Versed were administered as conscious sedation during continuous cardiorespiratory monitoring by the radiology RN, with a  total  moderate sedation time of 40 minutes. MEDICATIONS: Lidocaine 1% subcutaneous CONTRAST:  162m OMNIPAQUE IOHEXOL 300 MG/ML  SOLN PROCEDURE: The procedure, risks (including but not limited to bleeding, infection, organ damage ), benefits, and alternatives were explained to the patient. Questions regarding the procedure were encouraged and answered. The patient understands and consents to the procedure. Right femoral region prepped and draped in usual sterile fashion. Maximal barrier sterile technique was utilized including caps, mask, sterile gowns, sterile gloves, sterile drape, hand hygiene and skin antiseptic. The right common femoral artery was localized under ultrasound. Under real-time ultrasound guidance, the vessel was accessed with a 21-gauge micropuncture needle, exchanged over a 018 guidewire for a transitional dilator, through which a 035 guidewire was advanced. Over this, a 5 FP[place]vascular sheath was placed, through which a 5 FP[place]C2 catheter was advanced and used to selectively catheterize the superior mesenteric artery for selective arteriography in multiple projections. The C2 was then utilized to selectively catheterize the inferior mesenteric artery for selective arteriography in multiple projections. The C2 was then utilized to selectively catheterize the celiac axis for selective arteriography. The catheter and sheath were removed and hemostasis achieved with the aid of the Exoseal device after confirmatory femoral arteriography. The patient tolerated the procedure well. COMPLICATIONS: None immediate FINDINGS: No evidence of active extravasation, early draining vein, AVM, or other lesion to suggest a site or etiology of the patient's GI bleeding. Replaced right hepatic arterial supply from the superior mesenteric artery, an anatomic variant. Venous phase confirms patency of the IMV and portal venous system. IMPRESSION: 1. Negative three-vessel mesenteric arteriogram. No evidence of active  extravasation or other focal lesion to suggest etiology of GI bleed. Electronically Signed   By: DLucrezia EuropeM.D.   On: 06/05/2015 13:49   Ir [place]Guide Vasc Access Right  06/05/2015  CLINICAL DATA:  GI bleed. Tagged red cell scintigraphy showed a Focal area of activity in the right upper abdomen. CTA suggested acute hemorrhage in into the lumen of the splenic flexure of the colon. Descending and sigmoid diverticulosis. EXAM: SELECTIVE VISCERAL ARTERIOGRAPHY; IR ULTRASOUND GUIDANCE VASC ACCESS RIGHT ANESTHESIA/SEDATION: Intravenous Fentanyl and Versed were administered as conscious sedation during continuous cardiorespiratory monitoring by the radiology RN, with a total moderate sedation time of 40 minutes. MEDICATIONS: Lidocaine 1% subcutaneous CONTRAST:  1715mOMNIPAQUE IOHEXOL 300 MG/ML  SOLN PROCEDURE: The procedure, risks (including but not limited to bleeding, infection, organ damage ), benefits, and alternatives were explained to the patient. Questions regarding the procedure were encouraged and answered. The patient understands and consents to the procedure. Right femoral region prepped and draped in usual sterile fashion. Maximal barrier sterile technique was utilized including caps, mask, sterile gowns, sterile gloves, sterile drape, hand hygiene and skin antiseptic. The right common femoral artery was localized under ultrasound. Under real-time ultrasound guidance, the vessel was accessed with a 21-gauge micropuncture needle, exchanged over a 018 guidewire for a transitional dilator, through which a 035 guidewire was advanced. Over this, a 5 Fr[place]ascular sheath was placed, through which a 5 Fr[place]2 catheter was advanced and used to selectively catheterize the superior mesenteric artery for selective arteriography in multiple projections. The C2 was then utilized to selectively catheterize the inferior mesenteric artery for selective arteriography in multiple projections. The C2 was then utilized to  selectively catheterize the celiac axis for selective arteriography. The catheter and sheath were removed and hemostasis achieved with the aid of the Exoseal device after confirmatory femoral arteriography. The patient tolerated  the procedure well. COMPLICATIONS: None immediate FINDINGS: No evidence of active extravasation, early draining vein, AVM, or other lesion to suggest a site or etiology of the patient's GI bleeding. Replaced right hepatic arterial supply from the superior mesenteric artery, an anatomic variant. Venous phase confirms patency of the IMV and portal venous system. IMPRESSION: 1. Negative three-vessel mesenteric arteriogram. No evidence of active extravasation or other focal lesion to suggest etiology of GI bleed. Electronically Signed   By: Lucrezia Europe M.D.   On: 06/05/2015 13:49   Ct Angio Abd/pel W/ And/or W/o  06/05/2015  CLINICAL DATA:  Acute onset of GI bleeding. Bloody bowel movements. Decreased hemoglobin. Initial encounter. EXAM: CTA ABDOMEN AND PELVIS wITHOUT AND WITH CONTRAST TECHNIQUE: Multidetector CT imaging of the abdomen and pelvis was performed using the standard protocol during bolus administration of intravenous contrast. Multiplanar reconstructed images and MIPs were obtained and reviewed to evaluate the vascular anatomy. CONTRAST:  187m OMNIPAQUE IOHEXOL 350 MG/ML SOLN COMPARISON:  CT of the abdomen and pelvis from 09/18/2010 FINDINGS: Mild bibasilar atelectasis is noted. A moderate hiatal hernia is noted. A small amount of contrast is noted within the splenic flexure of the colon, compatible with acute hemorrhage into the splenic flexure of the colon. The abdominal aorta is grossly unremarkable in appearance, without calcific atherosclerotic disease. The celiac trunk, superior mesenteric artery, bilateral renal arteries and inferior mesenteric artery remain fully patent. The inferior vena cava is unremarkable in appearance. The liver and spleen are unremarkable in  appearance. The patient is status post cholecystectomy, with clips noted at the gallbladder fossa. The pancreas and adrenal glands are unremarkable. A 3 mm stone is noted at the interpole region of the right kidney. A 2 mm stone is noted at the upper pole of the left kidney. The kidneys are otherwise unremarkable. There is no evidence of hydronephrosis. No obstructing ureteral stones are identified. There is Richter herniation of the ascending colon into a moderate to large right posterior flank hernia, without evidence of obstruction. Scattered diverticulosis is noted along the descending and sigmoid colon, without evidence of diverticulitis. No free fluid is identified. The small bowel is unremarkable in appearance. The stomach is within normal limits. No acute vascular abnormalities are seen. The appendix is normal in caliber and contains air, without evidence of appendicitis. The bladder is mildly distended and grossly unremarkable. Postoperative change is noted along the anterior pelvic wall. The patient is status post hysterectomy. The ovaries are relatively symmetric. No suspicious adnexal masses are seen. No inguinal lymphadenopathy is seen. No acute osseous abnormalities are identified. There is mild grade 1 anterolisthesis of L4 on L5, reflecting underlying facet disease. Review of the MIP images confirms the above findings. IMPRESSION: 1. Acute hemorrhage noted into the splenic flexure of the colon, corresponding to the patient's GI bleeding. 2. Moderate hiatal hernia noted. 3. Scattered diverticulosis along the descending and sigmoid colon, without evidence of diverticulitis. 4. Richter herniation of the ascending colon into a moderate to large right posterior flank hernia, without evidence of obstruction. 5. Small nonobstructing bilateral renal stones, measuring up to 3 mm in size. 6. Mild bibasilar atelectasis noted. These results were called by telephone at the time of interpretation on 06/05/2015 at  3:16 am [id number] who verbally acknowledged these results. Electronically Signed   By: JGarald BaldingM.D.   On: 06/05/2015 03:31    Review of Systems  All other systems reviewed and are negative.  Blood pressure 94/53, pulse 93, temperature  98.4 F (36.9 C), temperature source Oral, resp. rate 24, height _0  (1.626 m), weight 73.8 kg (162 lb 11.2 oz), SpO2 96 %. Physical Exam  Constitutional: She is oriented to person, place, and time. She appears well-developed and well-nourished. No distress.  HENT:  Head: Normocephalic and atraumatic.  Right Ear: External ear normal.  Left Ear: External ear normal.  Nose: Nose normal.  Mouth/Throat: Oropharynx is clear and moist.  Eyes: Conjunctivae are normal. Pupils are equal, round, and reactive to light. Right eye exhibits no discharge. Left eye exhibits no discharge. No scleral icterus.  Neck: Normal range of motion. No tracheal deviation present. No thyromegaly present.  Cardiovascular: Normal rate, regular rhythm, normal heart sounds and intact distal pulses.   No murmur heard. Respiratory: Effort normal and breath sounds normal. No respiratory distress. She has no wheezes.  GI: Soft. She exhibits no distension. There is no tenderness. There is no guarding.  Musculoskeletal: Normal range of motion. She exhibits no edema or tenderness.  Lymphadenopathy:    She has no cervical adenopathy.  Neurological: She is alert and oriented to person, place, and time.  Skin: Skin is warm and dry. No rash noted.  Psychiatric: Her behavior is normal. Judgment normal.    Assessment/Plan: Lower gastrointestinal hemorrhage  Hopefully this will stop without need for surgical intervention. The source does appear to be in the location of the splenic flexure. Given this, if surgery is required, we can consider prepping her intestines and performing just a partial colectomy with anastomosis rather than a subtotal colectomy and ileostomy. I  discussed this with her in detail and we will follow her closely with you.  Leani Myron A 06/05/2015, 4:40 PM

## 2015-06-06 DIAGNOSIS — F329 Major depressive disorder, single episode, unspecified: Secondary | ICD-10-CM

## 2015-06-06 LAB — TYPE AND SCREEN
ABO/RH(D): A POS
Antibody Screen: NEGATIVE
UNIT DIVISION: 0
Unit division: 0
Unit division: 0

## 2015-06-06 LAB — BASIC METABOLIC PANEL
Anion gap: 9 (ref 5–15)
BUN: 20 mg/dL (ref 6–20)
CALCIUM: 8.2 mg/dL — AB (ref 8.9–10.3)
CO2: 23 mmol/L (ref 22–32)
CREATININE: 0.86 mg/dL (ref 0.44–1.00)
Chloride: 108 mmol/L (ref 101–111)
GFR calc Af Amer: >60 mL/min (ref >60)
GLUCOSE: 98 mg/dL (ref 65–99)
Potassium: 3.3 mmol/L — ABNORMAL LOW (ref 3.5–5.1)
SODIUM: 140 mmol/L (ref 135–145)

## 2015-06-06 LAB — CBC WITH DIFFERENTIAL/PLATELET
BASOS ABS: 0 10*3/uL (ref 0.0–0.1)
BASOS ABS: 0 10*3/uL (ref 0.0–0.1)
BASOS PCT: 1 %
BASOS PCT: 1 %
EOS ABS: 0.1 10*3/uL (ref 0.0–0.7)
EOS PCT: 2 %
Eosinophils Absolute: 0.1 10*3/uL (ref 0.0–0.7)
Eosinophils Relative: 2 %
HCT: 30.3 % — ABNORMAL LOW (ref 36.0–46.0)
HEMATOCRIT: 30 % — AB (ref 36.0–46.0)
Hemoglobin: 9.9 g/dL — ABNORMAL LOW (ref 12.0–15.0)
Hemoglobin: 9.7 g/dL — ABNORMAL LOW (ref 12.0–15.0)
LYMPHS PCT: 23 %
Lymphocytes Relative: 26 %
Lymphs Abs: 1.7 10*3/uL (ref 0.7–4.0)
Lymphs Abs: 1.7 10*3/uL (ref 0.7–4.0)
MCH: 29.7 pg (ref 26.0–34.0)
MCH: 29.8 pg (ref 26.0–34.0)
MCHC: 32.7 g/dL (ref 30.0–36.0)
MCHC: 32.3 g/dL (ref 30.0–36.0)
MCV: 91 fL (ref 78.0–100.0)
MCV: 92.3 fL (ref 78.0–100.0)
MONO ABS: 0.6 10*3/uL (ref 0.1–1.0)
MONO ABS: 0.4 10*3/uL (ref 0.1–1.0)
Monocytes Relative: 8 %
Monocytes Relative: 5 %
NEUTROS ABS: 4.3 10*3/uL (ref 1.7–7.7)
Neutro Abs: 5 10*3/uL (ref 1.7–7.7)
Neutrophils Relative %: 68 %
Neutrophils Relative %: 66 %
PLATELETS: 204 10*3/uL (ref 150–400)
PLATELETS: 233 10*3/uL (ref 150–400)
RBC: 3.33 MIL/uL — AB (ref 3.87–5.11)
RBC: 3.25 MIL/uL — AB (ref 3.87–5.11)
RDW: 17.3 % — AB (ref 11.5–15.5)
RDW: 17.9 % — AB (ref 11.5–15.5)
WBC: 7.4 10*3/uL (ref 4.0–10.5)
WBC: 6.6 10*3/uL (ref 4.0–10.5)

## 2015-06-06 MED ORDER — POTASSIUM CHLORIDE CRYS ER 20 MEQ PO TBCR
40.0000 meq | EXTENDED_RELEASE_TABLET | Freq: Once | ORAL | Status: AC
  Administered 2015-06-06: 40 meq via ORAL
  Filled 2015-06-06: qty 2

## 2015-06-06 NOTE — Progress Notes (Signed)
Physical Therapy Treatment Patient Details Name: [name] MRN: [id number] DOB: 07-08-1945 Today's Date: 06/06/2015    History of Present Illness pt is a 69 y.o. female with h/o IBS, DDD, diverticulosis, admitted with 1 week of melena.  Work up included units of PRBC's, colonoscopy and endoscopy.    PT Comments    Pt is finding that she is fairly mobile with minor help but could benefit from extra help at home initially.  No clear plan for extra help but does expect intermittent arrival of assistance for herself and home.  Will possibly need to use an AD.  Follow Up Recommendations  Home health PT;Supervision - Intermittent     Equipment Recommendations  None recommended by PT    Recommendations for Other Services       Precautions / Restrictions Precautions Precautions: Fall Restrictions Weight Bearing Restrictions: No    Mobility  Bed Mobility Overal bed mobility: Needs Assistance Bed Mobility: Supine to Sit;Sit to Supine     Supine to sit: Min assist;Mod assist Sit to supine: Min assist;Mod assist   General bed mobility comments: issues of being able to flex R hip and knee, but now trying to keep straight  Transfers Overall transfer level: Needs assistance Equipment used: 1 person hand held assist Transfers: Sit to/from [facility] Sit to Stand: Min guard Stand pivot transfers: Min guard       General transfer comment: Min guard for safety. Stood from [employer], from toilet x1. Sway noted in standing. No dizziness reported.   Ambulation/Gait Ambulation/Gait assistance: Min guard Ambulation Distance (Feet): 175 Feet Assistive device: None Gait Pattern/deviations: Step-through pattern;Decreased weight shift to right;Decreased dorsiflexion - right;Trunk flexed;Narrow base of support Gait velocity: reduced Gait velocity interpretation: Below normal speed for age/gender General Gait Details: Declines DME   Stairs             Wheelchair Mobility    Modified Rankin (Stroke Patients Only)       Balance Overall balance assessment: Needs assistance Sitting-balance support: Feet supported Sitting balance-Leahy Scale: Good     Standing balance support: No upper extremity supported Standing balance-Leahy Scale: Fair                      Cognition Arousal/Alertness: Awake/alert Behavior During Therapy: WFL for tasks assessed/performed Overall Cognitive Status: Within Functional Limits for tasks assessed                      Exercises      General Comments General comments (skin integrity, edema, etc.): Pt is getting up to walk and back to bed as she is still having uncontrolled episodes of GI "explosion"      Pertinent Vitals/Pain Pain Assessment: No/denies pain    Home Living                      Prior Function            PT Goals (current goals can now be found in the care plan section) Acute Rehab PT Goals Patient Stated Goal: to get GI issues resolved Progress towards PT goals: Progressing toward goals    Frequency  Min 3X/week    PT Plan Current plan remains appropriate    Co-evaluation             End of Session   Activity Tolerance: Patient limited by fatigue Patient left: in bed;with call bell/phone within reach;with bed alarm set;with  family/visitor present     Time: 1325-1345 PT Time Calculation (min) (ACUTE ONLY): 20 min  Charges:  $Gait Training: 8-22 mins                    G Codes:      Ramond Dial Jun 12, 2015, 2:10 PM   Mee Hives, PT MS Acute Rehab Dept. Number: [facility] [id number] and [place] [phone]

## 2015-06-06 NOTE — Progress Notes (Signed)
OT Cancellation Note  Patient Details Name: [name] MRN: [id number] DOB: 16-Jul-1945   Cancelled Treatment:    Reason Eval/Treat Not Completed: Fatigue/lethargy limiting ability to participate - will reattempt.   [name], OTR/L [id number]  06/06/2015, 3:25 PM

## 2015-06-06 NOTE — Progress Notes (Signed)
7 Days Post-Op  Subjective: She is pain free, she says stools are still black in color.  She is most concerned about getting a PICC line, her arms are worn out.  She is also wanting a regular diet if she is not having surgery soon.  She is starved.   She is still on clears.  Objective: Vital signs in last 24 hours: Temp:  [98.1 F (36.7 C)-98.4 F (36.9 C)] 98.2 F (36.8 C) (12/15 0508) Pulse Rate:  [84-95] 85 (12/15 0508) Resp:  [16-24] 18 (12/15 0508) BP: (94-121)/(53-71) 111/56 mmHg (12/15 0508) SpO2:  [93 %-99 %] 93 % (12/15 0508) Last BM Date: 06/05/15 220 PO recorded BM x 4 yesterday and 2 so far this AM. Urine not recorded. Afebrile, VSS Transfused and H/H stable this Am Intake/Output from previous day: 12/14 0701 - 12/15 0700 In: 910 [P.O.:220; I.V.:20; Blood:670] Out: -  Intake/Output this shift: Total I/O In: 120 [P.O.:120] Out: -   General appearance: alert, cooperative and no distress GI: soft, non-tender; bowel sounds normal; no masses,  no organomegaly  Lab Results:   Recent Labs  06/05/15 2035 06/06/15 0608  WBC 7.4 7.4  HGB 10.4* 9.9*  HCT 31.3* 30.3*  PLT 215 204    BMET  Recent Labs  06/05/15 0445 06/06/15 0608  NA 137 140  K 3.6 3.3*  CL 106 108  CO2 23 23  GLUCOSE 97 98  BUN 24* 20  CREATININE 0.81 0.86  CALCIUM 8.1* 8.2*   PT/INR  Recent Labs  06/04/15 0141  LABPROT 16.3*  INR 1.30     Recent Labs Lab 06/04/15 0141  AST 28  ALT 22  ALKPHOS 39  BILITOT 1.0  PROT 4.7*  ALBUMIN 2.7*     Lipase     Component Value Date/Time   LIPASE 36.0 02/04/2010 1102     Studies/Results: Ir Angiogram Visceral Selective  06/05/2015  CLINICAL DATA:  GI bleed. Tagged red cell scintigraphy showed a Focal area of activity in the right upper abdomen. CTA suggested acute hemorrhage in into the lumen of the splenic flexure of the colon. Descending and sigmoid diverticulosis. EXAM: SELECTIVE VISCERAL ARTERIOGRAPHY; IR ULTRASOUND  GUIDANCE VASC ACCESS RIGHT ANESTHESIA/SEDATION: Intravenous Fentanyl and Versed were administered as conscious sedation during continuous cardiorespiratory monitoring by the radiology RN, with a total moderate sedation time of 40 minutes. MEDICATIONS: Lidocaine 1% subcutaneous CONTRAST:  137mL OMNIPAQUE IOHEXOL 300 MG/ML  SOLN PROCEDURE: The procedure, risks (including but not limited to bleeding, infection, organ damage ), benefits, and alternatives were explained to the patient. Questions regarding the procedure were encouraged and answered. The patient understands and consents to the procedure. Right femoral region prepped and draped in usual sterile fashion. Maximal barrier sterile technique was utilized including caps, mask, sterile gowns, sterile gloves, sterile drape, hand hygiene and skin antiseptic. The right common femoral artery was localized under ultrasound. Under real-time ultrasound guidance, the vessel was accessed with a 21-gauge micropuncture needle, exchanged over a 018 guidewire for a transitional dilator, through which a 035 guidewire was advanced. Over this, a 5 [place] vascular sheath was placed, through which a 5 [place] C2 catheter was advanced and used to selectively catheterize the superior mesenteric artery for selective arteriography in multiple projections. The C2 was then utilized to selectively catheterize the inferior mesenteric artery for selective arteriography in multiple projections. The C2 was then utilized to selectively catheterize the celiac axis for selective arteriography. The catheter and sheath were removed and hemostasis achieved with the  aid of the Exoseal device after confirmatory femoral arteriography. The patient tolerated the procedure well. COMPLICATIONS: None immediate FINDINGS: No evidence of active extravasation, early draining vein, AVM, or other lesion to suggest a site or etiology of the patient's GI bleeding. Replaced right hepatic arterial supply from the  superior mesenteric artery, an anatomic variant. Venous phase confirms patency of the IMV and portal venous system. IMPRESSION: 1. Negative three-vessel mesenteric arteriogram. No evidence of active extravasation or other focal lesion to suggest etiology of GI bleed. Electronically Signed   By: Lucrezia Europe M.D.   On: 06/05/2015 13:49   Ir Angiogram Visceral Selective  06/05/2015  CLINICAL DATA:  GI bleed. Tagged red cell scintigraphy showed a Focal area of activity in the right upper abdomen. CTA suggested acute hemorrhage in into the lumen of the splenic flexure of the colon. Descending and sigmoid diverticulosis. EXAM: SELECTIVE VISCERAL ARTERIOGRAPHY; IR ULTRASOUND GUIDANCE VASC ACCESS RIGHT ANESTHESIA/SEDATION: Intravenous Fentanyl and Versed were administered as conscious sedation during continuous cardiorespiratory monitoring by the radiology RN, with a total moderate sedation time of 40 minutes. MEDICATIONS: Lidocaine 1% subcutaneous CONTRAST:  155mL OMNIPAQUE IOHEXOL 300 MG/ML  SOLN PROCEDURE: The procedure, risks (including but not limited to bleeding, infection, organ damage ), benefits, and alternatives were explained to the patient. Questions regarding the procedure were encouraged and answered. The patient understands and consents to the procedure. Right femoral region prepped and draped in usual sterile fashion. Maximal barrier sterile technique was utilized including caps, mask, sterile gowns, sterile gloves, sterile drape, hand hygiene and skin antiseptic. The right common femoral artery was localized under ultrasound. Under real-time ultrasound guidance, the vessel was accessed with a 21-gauge micropuncture needle, exchanged over a 018 guidewire for a transitional dilator, through which a 035 guidewire was advanced. Over this, a 5 [place] vascular sheath was placed, through which a 5 [place] C2 catheter was advanced and used to selectively catheterize the superior mesenteric artery for selective  arteriography in multiple projections. The C2 was then utilized to selectively catheterize the inferior mesenteric artery for selective arteriography in multiple projections. The C2 was then utilized to selectively catheterize the celiac axis for selective arteriography. The catheter and sheath were removed and hemostasis achieved with the aid of the Exoseal device after confirmatory femoral arteriography. The patient tolerated the procedure well. COMPLICATIONS: None immediate FINDINGS: No evidence of active extravasation, early draining vein, AVM, or other lesion to suggest a site or etiology of the patient's GI bleeding. Replaced right hepatic arterial supply from the superior mesenteric artery, an anatomic variant. Venous phase confirms patency of the IMV and portal venous system. IMPRESSION: 1. Negative three-vessel mesenteric arteriogram. No evidence of active extravasation or other focal lesion to suggest etiology of GI bleed. Electronically Signed   By: Lucrezia Europe M.D.   On: 06/05/2015 13:49   Ir Angiogram Visceral Selective  06/05/2015  CLINICAL DATA:  GI bleed. Tagged red cell scintigraphy showed a Focal area of activity in the right upper abdomen. CTA suggested acute hemorrhage in into the lumen of the splenic flexure of the colon. Descending and sigmoid diverticulosis. EXAM: SELECTIVE VISCERAL ARTERIOGRAPHY; IR ULTRASOUND GUIDANCE VASC ACCESS RIGHT ANESTHESIA/SEDATION: Intravenous Fentanyl and Versed were administered as conscious sedation during continuous cardiorespiratory monitoring by the radiology RN, with a total moderate sedation time of 40 minutes. MEDICATIONS: Lidocaine 1% subcutaneous CONTRAST:  133mL OMNIPAQUE IOHEXOL 300 MG/ML  SOLN PROCEDURE: The procedure, risks (including but not limited to bleeding, infection, organ damage ), benefits, and alternatives were  explained to the patient. Questions regarding the procedure were encouraged and answered. The patient understands and consents to  the procedure. Right femoral region prepped and draped in usual sterile fashion. Maximal barrier sterile technique was utilized including caps, mask, sterile gowns, sterile gloves, sterile drape, hand hygiene and skin antiseptic. The right common femoral artery was localized under ultrasound. Under real-time ultrasound guidance, the vessel was accessed with a 21-gauge micropuncture needle, exchanged over a 018 guidewire for a transitional dilator, through which a 035 guidewire was advanced. Over this, a 5 [place] vascular sheath was placed, through which a 5 [place] C2 catheter was advanced and used to selectively catheterize the superior mesenteric artery for selective arteriography in multiple projections. The C2 was then utilized to selectively catheterize the inferior mesenteric artery for selective arteriography in multiple projections. The C2 was then utilized to selectively catheterize the celiac axis for selective arteriography. The catheter and sheath were removed and hemostasis achieved with the aid of the Exoseal device after confirmatory femoral arteriography. The patient tolerated the procedure well. COMPLICATIONS: None immediate FINDINGS: No evidence of active extravasation, early draining vein, AVM, or other lesion to suggest a site or etiology of the patient's GI bleeding. Replaced right hepatic arterial supply from the superior mesenteric artery, an anatomic variant. Venous phase confirms patency of the IMV and portal venous system. IMPRESSION: 1. Negative three-vessel mesenteric arteriogram. No evidence of active extravasation or other focal lesion to suggest etiology of GI bleed. Electronically Signed   By: Lucrezia Europe M.D.   On: 06/05/2015 13:49   Ir US Guide Vasc Access Right  06/05/2015  CLINICAL DATA:  GI bleed. Tagged red cell scintigraphy showed a Focal area of activity in the right upper abdomen. CTA suggested acute hemorrhage in into the lumen of the splenic flexure of the colon.  Descending and sigmoid diverticulosis. EXAM: SELECTIVE VISCERAL ARTERIOGRAPHY; IR ULTRASOUND GUIDANCE VASC ACCESS RIGHT ANESTHESIA/SEDATION: Intravenous Fentanyl and Versed were administered as conscious sedation during continuous cardiorespiratory monitoring by the radiology RN, with a total moderate sedation time of 40 minutes. MEDICATIONS: Lidocaine 1% subcutaneous CONTRAST:  143mL OMNIPAQUE IOHEXOL 300 MG/ML  SOLN PROCEDURE: The procedure, risks (including but not limited to bleeding, infection, organ damage ), benefits, and alternatives were explained to the patient. Questions regarding the procedure were encouraged and answered. The patient understands and consents to the procedure. Right femoral region prepped and draped in usual sterile fashion. Maximal barrier sterile technique was utilized including caps, mask, sterile gowns, sterile gloves, sterile drape, hand hygiene and skin antiseptic. The right common femoral artery was localized under ultrasound. Under real-time ultrasound guidance, the vessel was accessed with a 21-gauge micropuncture needle, exchanged over a 018 guidewire for a transitional dilator, through which a 035 guidewire was advanced. Over this, a 5 [place] vascular sheath was placed, through which a 5 [place] C2 catheter was advanced and used to selectively catheterize the superior mesenteric artery for selective arteriography in multiple projections. The C2 was then utilized to selectively catheterize the inferior mesenteric artery for selective arteriography in multiple projections. The C2 was then utilized to selectively catheterize the celiac axis for selective arteriography. The catheter and sheath were removed and hemostasis achieved with the aid of the Exoseal device after confirmatory femoral arteriography. The patient tolerated the procedure well. COMPLICATIONS: None immediate FINDINGS: No evidence of active extravasation, early draining vein, AVM, or other lesion to suggest a site  or etiology of the patient's GI bleeding. Replaced right hepatic arterial supply from the superior  mesenteric artery, an anatomic variant. Venous phase confirms patency of the IMV and portal venous system. IMPRESSION: 1. Negative three-vessel mesenteric arteriogram. No evidence of active extravasation or other focal lesion to suggest etiology of GI bleed. Electronically Signed   By: Lucrezia Europe M.D.   On: 06/05/2015 13:49   Ct Angio Abd/pel W/ And/or W/o  06/05/2015  CLINICAL DATA:  Acute onset of GI bleeding. Bloody bowel movements. Decreased hemoglobin. Initial encounter. EXAM: CTA ABDOMEN AND PELVIS wITHOUT AND WITH CONTRAST TECHNIQUE: Multidetector CT imaging of the abdomen and pelvis was performed using the standard protocol during bolus administration of intravenous contrast. Multiplanar reconstructed images and MIPs were obtained and reviewed to evaluate the vascular anatomy. CONTRAST:  163mL OMNIPAQUE IOHEXOL 350 MG/ML SOLN COMPARISON:  CT of the abdomen and pelvis from 09/18/2010 FINDINGS: Mild bibasilar atelectasis is noted. A moderate hiatal hernia is noted. A small amount of contrast is noted within the splenic flexure of the colon, compatible with acute hemorrhage into the splenic flexure of the colon. The abdominal aorta is grossly unremarkable in appearance, without calcific atherosclerotic disease. The celiac trunk, superior mesenteric artery, bilateral renal arteries and inferior mesenteric artery remain fully patent. The inferior vena cava is unremarkable in appearance. The liver and spleen are unremarkable in appearance. The patient is status post cholecystectomy, with clips noted at the gallbladder fossa. The pancreas and adrenal glands are unremarkable. A 3 mm stone is noted at the interpole region of the right kidney. A 2 mm stone is noted at the upper pole of the left kidney. The kidneys are otherwise unremarkable. There is no evidence of hydronephrosis. No obstructing ureteral stones are  identified. There is Richter herniation of the ascending colon into a moderate to large right posterior flank hernia, without evidence of obstruction. Scattered diverticulosis is noted along the descending and sigmoid colon, without evidence of diverticulitis. No free fluid is identified. The small bowel is unremarkable in appearance. The stomach is within normal limits. No acute vascular abnormalities are seen. The appendix is normal in caliber and contains air, without evidence of appendicitis. The bladder is mildly distended and grossly unremarkable. Postoperative change is noted along the anterior pelvic wall. The patient is status post hysterectomy. The ovaries are relatively symmetric. No suspicious adnexal masses are seen. No inguinal lymphadenopathy is seen. No acute osseous abnormalities are identified. There is mild grade 1 anterolisthesis of L4 on L5, reflecting underlying facet disease. Review of the MIP images confirms the above findings. IMPRESSION: 1. Acute hemorrhage noted into the splenic flexure of the colon, corresponding to the patient's GI bleeding. 2. Moderate hiatal hernia noted. 3. Scattered diverticulosis along the descending and sigmoid colon, without evidence of diverticulitis. 4. Richter herniation of the ascending colon into a moderate to large right posterior flank hernia, without evidence of obstruction. 5. Small nonobstructing bilateral renal stones, measuring up to 3 mm in size. 6. Mild bibasilar atelectasis noted. These results were called by telephone at the time of interpretation on 06/05/2015 at 3:16 am to [facility] on [id number], who verbally acknowledged these results. Electronically Signed   By: Garald Balding M.D.   On: 06/05/2015 03:31    Medications: . sodium chloride   Intravenous Once  . sodium chloride   Intravenous Once  . sodium chloride   Intravenous Once  . citalopram  20 mg Oral Daily  . pantoprazole (PROTONIX) IV  40 mg Intravenous Q12H   . sodium chloride  Stopped (06/04/15 0524)   Prior to Admission medications  Medication Sig Start Date End Date Taking? Authorizing Provider  citalopram (CELEXA) 40 MG tablet Take 1 tablet (40 mg total) by mouth daily. 03/20/15  Yes Aleksei Plotnikov V, MD  conjugated estrogens (PREMARIN) vaginal cream Place vaginally every 3 (three) days. pv 02/08/15  Yes Aleksei Plotnikov V, MD  pantoprazole (PROTONIX) 40 MG tablet Take 1 tablet (40 mg total) by mouth 2 (two) times daily. 03/28/15  Yes Irene Shipper, MD  Probiotic Product ([place] PO) Take 1 capsule by mouth daily.   Yes Historical Provider, MD  loratadine (CLARITIN) 10 MG tablet Take 1 tablet (10 mg total) by mouth daily. Patient not taking: Reported on 05/29/2015 02/29/12 02/28/13  Rowe Clack, MD    Assessment/Plan Lower GI Bleed (CT angio shows extravasation at Splenic flexure) Transfused 5 units PRBC Hx of IBS/diverticulosis/anal fissure Depression Anemia GERD Antibiotics:  None DVT:  SCD    Plan:  She is stable right now.  Will check on diet. Full liquids for now, PICC per DR. Ghimire.  LOS: 8 days    [name] 06/06/2015

## 2015-06-06 NOTE — Progress Notes (Signed)
PATIENT DETAILS Name: ROSHEDA ROZENBERG Age: 69 y.o. Sex: female Date of Birth: 1945/12/23 Admit Date: 05/29/2015 Admitting Physician Edwin Dada, MD [id number] Plotnikov, MD  Subjective: Black stools this morning.  Assessment/Plan: Principal Problem: Diverticular bleeding: Unfortunately continues to have stuttering lower GI bleed-black stools today may represent or blood, hemoglobin stable-but transfused on 12/14. Colonoscopy on 12/8 shows diverticulosis, EGD negative for bleeding foci. Tagged RBC scan who was equivocal, however a CTA abdomen 12/14 was suggestive of acute hemorrhage in the splenic flexure area. Mesenteric angiogram on 12/14 was negative. General surgery following-hopeful that we can avoid a partial colectomy  Active Problems: Acute blood loss anemia: Secondary to above, transfused 2 units of PRBC 12/14 Follow CBC-Hb stable today.  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 Boykin Reaper at [phone]   Procedures: EGD 12/7 Colonoscopy 12/8 Mesentric Angio 12/14  CONSULTS:  GI, general surgery and IR  Time spent 20 minutes-Greater than 50% of this time was spent in counseling, explanation of diagnosis, planning of further management, and coordination of care.  MEDICATIONS: Scheduled Meds: . sodium chloride   Intravenous Once  . sodium chloride   Intravenous Once  . sodium chloride   Intravenous Once  . citalopram  20 mg Oral Daily  . pantoprazole (PROTONIX) IV  40 mg Intravenous Q12H   Continuous Infusions: . sodium chloride Stopped (06/04/15 0524)   PRN Meds:.menthol-cetylpyridinium,  oxyCODONE, traMADol    PHYSICAL EXAM: Vital signs in last 24 hours: Filed Vitals:   06/05/15 1850 06/05/15 2122 06/06/15 0508 06/06/15 1404  BP: 112/68 108/54 111/56 119/54  Pulse: 84 88 85 93  Temp: 98.2 F (36.8 C) 98.1 F (36.7 C) 98.2 F (36.8 C) 98.5 F (36.9 C)  TempSrc: Oral Oral Oral Oral  Resp: 22 19 18 19   Height:      Weight:      SpO2: 95% 94% 93% 97%    Weight change:  Filed Weights   05/30/15 1023 05/31/15 1757 06/02/15 0437  Weight: 72.576 kg (160 lb) 74 kg (163 lb 2.3 oz) 73.8 kg (162 lb 11.2 oz)   Body mass index is 27.91 kg/(m^2).   Gen Exam: Awake and alert with clear speech.   Neck: Supple, No JVD.   Chest: B/L Clear-no rales CVS: S1 S2 Regular Abdomen: soft, BS +, non tender, non distended.  Extremities: no edema, lower extremities warm to touch. Neurologic: Non Focal.   Skin: No Rash.   Wounds: N/A.    Intake/Output from previous day:  Intake/Output Summary (Last 24 hours) at 06/06/15 1503 Last data filed at 06/06/15 1058  Gross per 24 hour  Intake    695 ml  Output      0 ml  Net    695 ml     LAB RESULTS: CBC  Recent Labs Lab 06/03/15 0638 06/04/15 0141  06/04/15 1050 06/04/15 1528 06/05/15 0445 06/05/15 2035 06/06/15 0608  WBC 4.2 6.0  --   --   --  6.1 7.4 7.4  HGB 6.7* 8.1*  < > 8.3* 7.7* 7.3* 10.4* 9.9*  HCT 20.1* 24.4*  < >  25.3* 23.4* 22.4* 31.3* 30.3*  PLT 150 159  --   --   --  193 215 204  MCV 91.0 91.4  --   --   --  92.9 90.5 91.0  MCH 30.3 30.3  --   --   --  30.3 30.1 29.7  MCHC 33.3 33.2  --   --   --  32.6 33.2 32.7  RDW 15.4 15.1  --   --   --  15.8* 16.4* 17.3*  LYMPHSABS  --   --   --   --   --   --  1.8 1.7  MONOABS  --   --   --   --   --   --  0.6 0.6  EOSABS  --   --   --   --   --   --  0.1 0.1  BASOSABS  --   --   --   --   --   --  0.0 0.0  < > = values in this interval not displayed.  Chemistries   Recent Labs Lab 06/01/15 0431 06/02/15 0441 06/04/15 0141 06/05/15 0445 06/06/15 0608    NA 140 139 139 137 140  K 3.3* 4.0 3.9 3.6 3.3*  CL 108 107 107 106 108  CO2 26 27 26 23 23   GLUCOSE 104* 111* 106* 97 98  BUN 7 8 13  24* 20  CREATININE 0.81 0.75 0.78 0.81 0.86  CALCIUM 8.1* 8.1* 8.0* 8.1* 8.2*    CBG: No results for input(s): GLUCAP in the last 168 hours.  GFR Estimated Creatinine Clearance: 59 mL/min (by C-G formula based on Cr of 0.86).  Coagulation profile  Recent Labs Lab 06/04/15 0141  INR 1.30    Cardiac Enzymes No results for input(s): CKMB, TROPONINI, MYOGLOBIN in the last 168 hours.  Invalid input(s): CK  Invalid input(s): POCBNP No results for input(s): DDIMER in the last 72 hours. No results for input(s): HGBA1C in the last 72 hours. No results for input(s): CHOL, HDL, LDLCALC, TRIG, CHOLHDL, LDLDIRECT in the last 72 hours. No results for input(s): TSH, T4TOTAL, T3FREE, THYROIDAB in the last 72 hours.  Invalid input(s): FREET3 No results for input(s): VITAMINB12, FOLATE, FERRITIN, TIBC, IRON, RETICCTPCT in the last 72 hours. No results for input(s): LIPASE, AMYLASE in the last 72 hours.  Urine Studies No results for input(s): UHGB, CRYS in the last 72 hours.  Invalid input(s): UACOL, UAPR, USPG, UPH, UTP, UGL, UKET, UBIL, UNIT, UROB, ULEU, UEPI, UWBC, URBC, UBAC, CAST, UCOM, BILUA  MICROBIOLOGY: Recent Results (from the past 240 hour(s))  MRSA PCR Screening     Status: None   Collection Time: 05/29/15  5:47 PM  Result Value Ref Range Status   MRSA by PCR NEGATIVE NEGATIVE Final    Comment:        The GeneXpert MRSA Assay (FDA approved for NASAL specimens only), is one component of a comprehensive MRSA colonization surveillance program. It is not intended to diagnose MRSA infection nor to guide or monitor treatment for MRSA infections.     RADIOLOGY STUDIES/RESULTS: Nm Gi Blood Loss  06/04/2015  CLINICAL DATA:  Possible upper GI bleed. Patient taking anti-inflammatories for the past 3 weeks. EXAM: NUCLEAR MEDICINE  GASTROINTESTINAL BLEEDING SCAN TECHNIQUE: Sequential abdominal images were obtained following intravenous administration of Tc-13m labeled red blood cells. RADIOPHARMACEUTICALS:  27.0 MCi Tc-74m in-vitro labeled red cells. COMPARISON:  None. FINDINGS: There is a vague area of activity noted in the right  upper quadrant just below the right kidney and lateral to the right iliac vein. I do not see any significant movement. This could be due to focal inflammation/diverticulitis, polyp, tumor or vascular abnormality such as aneurysm or varices. It is possible it could be a small bleeding focus in the hepatic flexure region of the colon but I think it is unlikely. I do not see any obvious findings for an upper GI bleed. IMPRESSION: Equivocal area of activity noted in the right upper quadrant, in the region of the hepatic flexure of the colon without obvious progression/movement. This is unlikely an active bleeding site and is more likely a false positive due to inflammation, polyp, tumor or vascular abnormality. Electronically Signed   By: Marijo Sanes M.D.   On: 06/04/2015 08:55   Ir Angiogram Visceral Selective  06/05/2015  CLINICAL DATA:  GI bleed. Tagged red cell scintigraphy showed a Focal area of activity in the right upper abdomen. CTA suggested acute hemorrhage in into the lumen of the splenic flexure of the colon. Descending and sigmoid diverticulosis. EXAM: SELECTIVE VISCERAL ARTERIOGRAPHY; IR ULTRASOUND GUIDANCE VASC ACCESS RIGHT ANESTHESIA/SEDATION: Intravenous Fentanyl and Versed were administered as conscious sedation during continuous cardiorespiratory monitoring by the radiology RN, with a total moderate sedation time of 40 minutes. MEDICATIONS: Lidocaine 1% subcutaneous CONTRAST:  124mL OMNIPAQUE IOHEXOL 300 MG/ML  SOLN PROCEDURE: The procedure, risks (including but not limited to bleeding, infection, organ damage ), benefits, and alternatives were explained to the patient. Questions regarding the  procedure were encouraged and answered. The patient understands and consents to the procedure. Right femoral region prepped and draped in usual sterile fashion. Maximal barrier sterile technique was utilized including caps, mask, sterile gowns, sterile gloves, sterile drape, hand hygiene and skin antiseptic. The right common femoral artery was localized under ultrasound. Under real-time ultrasound guidance, the vessel was accessed with a 21-gauge micropuncture needle, exchanged over a 018 guidewire for a transitional dilator, through which a 035 guidewire was advanced. Over this, a 5 [place] vascular sheath was placed, through which a 5 [place] C2 catheter was advanced and used to selectively catheterize the superior mesenteric artery for selective arteriography in multiple projections. The C2 was then utilized to selectively catheterize the inferior mesenteric artery for selective arteriography in multiple projections. The C2 was then utilized to selectively catheterize the celiac axis for selective arteriography. The catheter and sheath were removed and hemostasis achieved with the aid of the Exoseal device after confirmatory femoral arteriography. The patient tolerated the procedure well. COMPLICATIONS: None immediate FINDINGS: No evidence of active extravasation, early draining vein, AVM, or other lesion to suggest a site or etiology of the patient's GI bleeding. Replaced right hepatic arterial supply from the superior mesenteric artery, an anatomic variant. Venous phase confirms patency of the IMV and portal venous system. IMPRESSION: 1. Negative three-vessel mesenteric arteriogram. No evidence of active extravasation or other focal lesion to suggest etiology of GI bleed. Electronically Signed   By: Lucrezia Europe M.D.   On: 06/05/2015 13:49   Ir Angiogram Visceral Selective  06/05/2015  CLINICAL DATA:  GI bleed. Tagged red cell scintigraphy showed a Focal area of activity in the right upper abdomen. CTA  suggested acute hemorrhage in into the lumen of the splenic flexure of the colon. Descending and sigmoid diverticulosis. EXAM: SELECTIVE VISCERAL ARTERIOGRAPHY; IR ULTRASOUND GUIDANCE VASC ACCESS RIGHT ANESTHESIA/SEDATION: Intravenous Fentanyl and Versed were administered as conscious sedation during continuous cardiorespiratory monitoring by the radiology RN, with a total moderate sedation time of  40 minutes. MEDICATIONS: Lidocaine 1% subcutaneous CONTRAST:  154mL OMNIPAQUE IOHEXOL 300 MG/ML  SOLN PROCEDURE: The procedure, risks (including but not limited to bleeding, infection, organ damage ), benefits, and alternatives were explained to the patient. Questions regarding the procedure were encouraged and answered. The patient understands and consents to the procedure. Right femoral region prepped and draped in usual sterile fashion. Maximal barrier sterile technique was utilized including caps, mask, sterile gowns, sterile gloves, sterile drape, hand hygiene and skin antiseptic. The right common femoral artery was localized under ultrasound. Under real-time ultrasound guidance, the vessel was accessed with a 21-gauge micropuncture needle, exchanged over a 018 guidewire for a transitional dilator, through which a 035 guidewire was advanced. Over this, a 5 [place] vascular sheath was placed, through which a 5 [place] C2 catheter was advanced and used to selectively catheterize the superior mesenteric artery for selective arteriography in multiple projections. The C2 was then utilized to selectively catheterize the inferior mesenteric artery for selective arteriography in multiple projections. The C2 was then utilized to selectively catheterize the celiac axis for selective arteriography. The catheter and sheath were removed and hemostasis achieved with the aid of the Exoseal device after confirmatory femoral arteriography. The patient tolerated the procedure well. COMPLICATIONS: None immediate FINDINGS: No evidence of  active extravasation, early draining vein, AVM, or other lesion to suggest a site or etiology of the patient's GI bleeding. Replaced right hepatic arterial supply from the superior mesenteric artery, an anatomic variant. Venous phase confirms patency of the IMV and portal venous system. IMPRESSION: 1. Negative three-vessel mesenteric arteriogram. No evidence of active extravasation or other focal lesion to suggest etiology of GI bleed. Electronically Signed   By: Lucrezia Europe M.D.   On: 06/05/2015 13:49   Ir Angiogram Visceral Selective  06/05/2015  CLINICAL DATA:  GI bleed. Tagged red cell scintigraphy showed a Focal area of activity in the right upper abdomen. CTA suggested acute hemorrhage in into the lumen of the splenic flexure of the colon. Descending and sigmoid diverticulosis. EXAM: SELECTIVE VISCERAL ARTERIOGRAPHY; IR ULTRASOUND GUIDANCE VASC ACCESS RIGHT ANESTHESIA/SEDATION: Intravenous Fentanyl and Versed were administered as conscious sedation during continuous cardiorespiratory monitoring by the radiology RN, with a total moderate sedation time of 40 minutes. MEDICATIONS: Lidocaine 1% subcutaneous CONTRAST:  149mL OMNIPAQUE IOHEXOL 300 MG/ML  SOLN PROCEDURE: The procedure, risks (including but not limited to bleeding, infection, organ damage ), benefits, and alternatives were explained to the patient. Questions regarding the procedure were encouraged and answered. The patient understands and consents to the procedure. Right femoral region prepped and draped in usual sterile fashion. Maximal barrier sterile technique was utilized including caps, mask, sterile gowns, sterile gloves, sterile drape, hand hygiene and skin antiseptic. The right common femoral artery was localized under ultrasound. Under real-time ultrasound guidance, the vessel was accessed with a 21-gauge micropuncture needle, exchanged over a 018 guidewire for a transitional dilator, through which a 035 guidewire was advanced. Over this,  a 5 [place] vascular sheath was placed, through which a 5 [place] C2 catheter was advanced and used to selectively catheterize the superior mesenteric artery for selective arteriography in multiple projections. The C2 was then utilized to selectively catheterize the inferior mesenteric artery for selective arteriography in multiple projections. The C2 was then utilized to selectively catheterize the celiac axis for selective arteriography. The catheter and sheath were removed and hemostasis achieved with the aid of the Exoseal device after confirmatory femoral arteriography. The patient tolerated the procedure well. COMPLICATIONS: None immediate FINDINGS: No  evidence of active extravasation, early draining vein, AVM, or other lesion to suggest a site or etiology of the patient's GI bleeding. Replaced right hepatic arterial supply from the superior mesenteric artery, an anatomic variant. Venous phase confirms patency of the IMV and portal venous system. IMPRESSION: 1. Negative three-vessel mesenteric arteriogram. No evidence of active extravasation or other focal lesion to suggest etiology of GI bleed. Electronically Signed   By: Lucrezia Europe M.D.   On: 06/05/2015 13:49   Ir US Guide Vasc Access Right  06/05/2015  CLINICAL DATA:  GI bleed. Tagged red cell scintigraphy showed a Focal area of activity in the right upper abdomen. CTA suggested acute hemorrhage in into the lumen of the splenic flexure of the colon. Descending and sigmoid diverticulosis. EXAM: SELECTIVE VISCERAL ARTERIOGRAPHY; IR ULTRASOUND GUIDANCE VASC ACCESS RIGHT ANESTHESIA/SEDATION: Intravenous Fentanyl and Versed were administered as conscious sedation during continuous cardiorespiratory monitoring by the radiology RN, with a total moderate sedation time of 40 minutes. MEDICATIONS: Lidocaine 1% subcutaneous CONTRAST:  192mL OMNIPAQUE IOHEXOL 300 MG/ML  SOLN PROCEDURE: The procedure, risks (including but not limited to bleeding, infection, organ  damage ), benefits, and alternatives were explained to the patient. Questions regarding the procedure were encouraged and answered. The patient understands and consents to the procedure. Right femoral region prepped and draped in usual sterile fashion. Maximal barrier sterile technique was utilized including caps, mask, sterile gowns, sterile gloves, sterile drape, hand hygiene and skin antiseptic. The right common femoral artery was localized under ultrasound. Under real-time ultrasound guidance, the vessel was accessed with a 21-gauge micropuncture needle, exchanged over a 018 guidewire for a transitional dilator, through which a 035 guidewire was advanced. Over this, a 5 [place] vascular sheath was placed, through which a 5 [place] C2 catheter was advanced and used to selectively catheterize the superior mesenteric artery for selective arteriography in multiple projections. The C2 was then utilized to selectively catheterize the inferior mesenteric artery for selective arteriography in multiple projections. The C2 was then utilized to selectively catheterize the celiac axis for selective arteriography. The catheter and sheath were removed and hemostasis achieved with the aid of the Exoseal device after confirmatory femoral arteriography. The patient tolerated the procedure well. COMPLICATIONS: None immediate FINDINGS: No evidence of active extravasation, early draining vein, AVM, or other lesion to suggest a site or etiology of the patient's GI bleeding. Replaced right hepatic arterial supply from the superior mesenteric artery, an anatomic variant. Venous phase confirms patency of the IMV and portal venous system. IMPRESSION: 1. Negative three-vessel mesenteric arteriogram. No evidence of active extravasation or other focal lesion to suggest etiology of GI bleed. Electronically Signed   By: Lucrezia Europe M.D.   On: 06/05/2015 13:49   Ct Angio Abd/pel W/ And/or W/o  06/05/2015  CLINICAL DATA:  Acute onset of GI  bleeding. Bloody bowel movements. Decreased hemoglobin. Initial encounter. EXAM: CTA ABDOMEN AND PELVIS wITHOUT AND WITH CONTRAST TECHNIQUE: Multidetector CT imaging of the abdomen and pelvis was performed using the standard protocol during bolus administration of intravenous contrast. Multiplanar reconstructed images and MIPs were obtained and reviewed to evaluate the vascular anatomy. CONTRAST:  165mL OMNIPAQUE IOHEXOL 350 MG/ML SOLN COMPARISON:  CT of the abdomen and pelvis from 09/18/2010 FINDINGS: Mild bibasilar atelectasis is noted. A moderate hiatal hernia is noted. A small amount of contrast is noted within the splenic flexure of the colon, compatible with acute hemorrhage into the splenic flexure of the colon. The abdominal aorta is grossly unremarkable in appearance, without  calcific atherosclerotic disease. The celiac trunk, superior mesenteric artery, bilateral renal arteries and inferior mesenteric artery remain fully patent. The inferior vena cava is unremarkable in appearance. The liver and spleen are unremarkable in appearance. The patient is status post cholecystectomy, with clips noted at the gallbladder fossa. The pancreas and adrenal glands are unremarkable. A 3 mm stone is noted at the interpole region of the right kidney. A 2 mm stone is noted at the upper pole of the left kidney. The kidneys are otherwise unremarkable. There is no evidence of hydronephrosis. No obstructing ureteral stones are identified. There is Richter herniation of the ascending colon into a moderate to large right posterior flank hernia, without evidence of obstruction. Scattered diverticulosis is noted along the descending and sigmoid colon, without evidence of diverticulitis. No free fluid is identified. The small bowel is unremarkable in appearance. The stomach is within normal limits. No acute vascular abnormalities are seen. The appendix is normal in caliber and contains air, without evidence of appendicitis. The  bladder is mildly distended and grossly unremarkable. Postoperative change is noted along the anterior pelvic wall. The patient is status post hysterectomy. The ovaries are relatively symmetric. No suspicious adnexal masses are seen. No inguinal lymphadenopathy is seen. No acute osseous abnormalities are identified. There is mild grade 1 anterolisthesis of L4 on L5, reflecting underlying facet disease. Review of the MIP images confirms the above findings. IMPRESSION: 1. Acute hemorrhage noted into the splenic flexure of the colon, corresponding to the patient's GI bleeding. 2. Moderate hiatal hernia noted. 3. Scattered diverticulosis along the descending and sigmoid colon, without evidence of diverticulitis. 4. Richter herniation of the ascending colon into a moderate to large right posterior flank hernia, without evidence of obstruction. 5. Small nonobstructing bilateral renal stones, measuring up to 3 mm in size. 6. Mild bibasilar atelectasis noted. These results were called by telephone at the time of interpretation on 06/05/2015 at 3:16 am to [facility] on [facility], who verbally acknowledged these results. Electronically Signed   By: Garald Balding M.D.   On: 06/05/2015 03:31    Oren Binet, MD  [facility] Pager:[phone]  If 7PM-7AM, please contact night-coverage [email or web address] Password [email or web address] 06/06/2015, 3:03 PM   LOS: 8 days

## 2015-06-07 LAB — CBC WITH DIFFERENTIAL/PLATELET
BASOS ABS: 0 10*3/uL (ref 0.0–0.1)
Basophils Relative: 1 %
EOS ABS: 0.2 10*3/uL (ref 0.0–0.7)
EOS PCT: 2 %
HCT: 28.3 % — ABNORMAL LOW (ref 36.0–46.0)
Hemoglobin: 9.4 g/dL — ABNORMAL LOW (ref 12.0–15.0)
Lymphocytes Relative: 34 %
Lymphs Abs: 2.3 10*3/uL (ref 0.7–4.0)
MCH: 30.7 pg (ref 26.0–34.0)
MCHC: 33.2 g/dL (ref 30.0–36.0)
MCV: 92.5 fL (ref 78.0–100.0)
MONO ABS: 0.4 10*3/uL (ref 0.1–1.0)
Monocytes Relative: 6 %
Neutro Abs: 3.7 10*3/uL (ref 1.7–7.7)
Neutrophils Relative %: 57 %
PLATELETS: 215 10*3/uL (ref 150–400)
RBC: 3.06 MIL/uL — AB (ref 3.87–5.11)
RDW: 18 % — AB (ref 11.5–15.5)
WBC: 6.6 10*3/uL (ref 4.0–10.5)

## 2015-06-07 LAB — BASIC METABOLIC PANEL
ANION GAP: 5 (ref 5–15)
BUN: 12 mg/dL (ref 6–20)
CHLORIDE: 110 mmol/L (ref 101–111)
CO2: 24 mmol/L (ref 22–32)
CREATININE: 0.78 mg/dL (ref 0.44–1.00)
Calcium: 7.9 mg/dL — ABNORMAL LOW (ref 8.9–10.3)
GFR calc non Af Amer: >60 mL/min (ref >60)
Glucose, Bld: 113 mg/dL — ABNORMAL HIGH (ref 65–99)
Potassium: 3.2 mmol/L — ABNORMAL LOW (ref 3.5–5.1)
SODIUM: 139 mmol/L (ref 135–145)

## 2015-06-07 MED ORDER — POTASSIUM CHLORIDE CRYS ER 20 MEQ PO TBCR
40.0000 meq | EXTENDED_RELEASE_TABLET | Freq: Once | ORAL | Status: AC
  Administered 2015-06-07: 40 meq via ORAL
  Filled 2015-06-07: qty 2

## 2015-06-07 NOTE — Consult Note (Signed)
   [facility] CM Inpatient Consult   06/07/2015  [name] 04-27-1946 [id number] Referral received.  Patient evaluated for community based chronic disease management services with [facility] as a benefit of patient's [employer]. Spoke with patient at bedside to explain [facility] services.  Patient endorses that her primary care provider is Dr.Aleksei  Plotnikov.  Patient states she lives alone and has minimal support system.  She listed her neighbor [name], as a for contact only purposes.  Consent form signed.  Patient states she has chosen [facility] for physical therapy only.  Patient will receive post hospital discharge call and will be evaluated for monthly home visits for assessments and disease process education.  Left contact information and [facility] literature at bedside. Made Inpatient Case Manager aware that [facility] following. Of note, [facility] Care Management services does not replace or interfere with any services that are arranged by inpatient case management or social work.  For additional questions or referrals please contact:   Natividad Brood, RN BSN [facility] Liaison  [phone] business mobile phone

## 2015-06-07 NOTE — Progress Notes (Signed)
PATIENT DETAILS Name: [name] Age: 69 y.o. Sex: female Date of Birth: 12-06-1945 Admit Date: 05/29/2015 Admitting Physician Edwin Dada, MD [id number] Plotnikov, MD  Subjective: No black stools this am  Assessment/Plan: Principal Problem: Diverticular bleeding: Seems to be slowly improving-and has slowed down somewhat. Hemoglobin now stable after a total of 5 units of PRBC transfusion. Black stools  may represent or blood, hemoglobin stable-but transfused on 12/14. Colonoscopy on 12/8 shows diverticulosis, EGD negative for bleeding foci. Tagged RBC scan who was equivocal, however a CTA abdomen 12/14 was suggestive of acute hemorrhage in the splenic flexure area. Mesenteric angiogram on 12/14 was negative. General surgery following-hopeful that we can avoid a partial colectomy  Active Problems: Acute blood loss anemia: Secondary to above, transfused 5 units of PRBC so far -last on12/14-hemoglobin remains stable. Continue to follow CBC   Multiple gastric polyps: Noted on EGD - path reveals fundic gland polyps   Depression/anxiety: Continue Celexa  Deconditioning: PT evaluation-HHPT recommended  Disposition: Remain inpatient-home when hematochezia resolves  Antimicrobial agents  See below  Anti-infectives    None      DVT Prophylaxis: SCD's  Code Status: Full code   Family Communication called son Dr. Boykin Reaper at [phone] on 12/15-no one at bedside today  Procedures: EGD 12/7 Colonoscopy 12/8 Mesentric Angio 12/14  CONSULTS:  GI, general surgery and IR  Time spent 20 minutes-Greater than 50% of this time was spent in counseling, explanation of diagnosis, planning of further management, and coordination of care.  MEDICATIONS: Scheduled Meds: . sodium chloride   Intravenous Once  . citalopram  20 mg Oral Daily  . pantoprazole (PROTONIX) IV  40 mg Intravenous Q12H   Continuous Infusions: . sodium chloride 40 mL/hr  at 06/06/15 1608   PRN Meds:.menthol-cetylpyridinium, oxyCODONE, traMADol    PHYSICAL EXAM: Vital signs in last 24 hours: Filed Vitals:   06/06/15 1404 06/06/15 2125 06/07/15 0517 06/07/15 1451  BP: 119/54 115/62 97/49 95/43   Pulse: 93 86 83 79  Temp: 98.5 F (36.9 C) 98 F (36.7 C) 98.4 F (36.9 C) 97.8 F (36.6 C)  TempSrc: Oral Oral Oral Oral  Resp: 19 18 18 19   Height:      Weight:      SpO2: 97% 99% 97% 96%    Weight change:  Filed Weights   05/30/15 1023 05/31/15 1757 06/02/15 0437  Weight: 72.576 kg (160 lb) 74 kg (163 lb 2.3 oz) 73.8 kg (162 lb 11.2 oz)   Body mass index is 27.91 kg/(m^2).   Gen Exam: Awake and alert with clear speech.   Neck: Supple, No JVD.   Chest: B/L Clear-no rhonchi CVS: S1 S2 Regular Abdomen: soft, BS +, non tender, non distended.  Extremities: no edema, lower extremities warm to touch. Neurologic: Non Focal.   Skin: No Rash.   Wounds: N/A.    Intake/Output from previous day:  Intake/Output Summary (Last 24 hours) at 06/07/15 1529 Last data filed at 06/07/15 1409  Gross per 24 hour  Intake    740 ml  Output   1150 ml  Net   -410 ml     LAB RESULTS: CBC  Recent Labs Lab 06/05/15 0445 06/05/15 2035 06/06/15 0608 06/06/15 1916 06/07/15 0755  WBC 6.1 7.4 7.4 6.6 6.6  HGB 7.3* 10.4* 9.9* 9.7* 9.4*  HCT 22.4* 31.3* 30.3* 30.0* 28.3*  PLT 193 215 204 233 215  MCV 92.9  90.5 91.0 92.3 92.5  MCH 30.3 30.1 29.7 29.8 30.7  MCHC 32.6 33.2 32.7 32.3 33.2  RDW 15.8* 16.4* 17.3* 17.9* 18.0*  LYMPHSABS  --  1.8 1.7 1.7 2.3  MONOABS  --  0.6 0.6 0.4 0.4  EOSABS  --  0.1 0.1 0.1 0.2  BASOSABS  --  0.0 0.0 0.0 0.0    Chemistries   Recent Labs Lab 06/02/15 0441 06/04/15 0141 06/05/15 0445 06/06/15 0608 06/07/15 0510  NA 139 139 137 140 139  K 4.0 3.9 3.6 3.3* 3.2*  CL 107 107 106 108 110  CO2 27 26 23 23 24   GLUCOSE 111* 106* 97 98 113*  BUN 8 13 24* 20 12  CREATININE 0.75 0.78 0.81 0.86 0.78  CALCIUM 8.1* 8.0*  8.1* 8.2* 7.9*    CBG: No results for input(s): GLUCAP in the last 168 hours.  GFR Estimated Creatinine Clearance: 63.4 mL/min (by C-G formula based on Cr of 0.78).  Coagulation profile  Recent Labs Lab 06/04/15 0141  INR 1.30    Cardiac Enzymes No results for input(s): CKMB, TROPONINI, MYOGLOBIN in the last 168 hours.  Invalid input(s): CK  Invalid input(s): POCBNP No results for input(s): DDIMER in the last 72 hours. No results for input(s): HGBA1C in the last 72 hours. No results for input(s): CHOL, HDL, LDLCALC, TRIG, CHOLHDL, LDLDIRECT in the last 72 hours. No results for input(s): TSH, T4TOTAL, T3FREE, THYROIDAB in the last 72 hours.  Invalid input(s): FREET3 No results for input(s): VITAMINB12, FOLATE, FERRITIN, TIBC, IRON, RETICCTPCT in the last 72 hours. No results for input(s): LIPASE, AMYLASE in the last 72 hours.  Urine Studies No results for input(s): UHGB, CRYS in the last 72 hours.  Invalid input(s): UACOL, UAPR, USPG, UPH, UTP, UGL, UKET, UBIL, UNIT, UROB, ULEU, UEPI, UWBC, URBC, UBAC, CAST, UCOM, BILUA  MICROBIOLOGY: Recent Results (from the past 240 hour(s))  MRSA PCR Screening     Status: None   Collection Time: 05/29/15  5:47 PM  Result Value Ref Range Status   MRSA by PCR NEGATIVE NEGATIVE Final    Comment:        The GeneXpert MRSA Assay (FDA approved for NASAL specimens only), is one component of a comprehensive MRSA colonization surveillance program. It is not intended to diagnose MRSA infection nor to guide or monitor treatment for MRSA infections.     RADIOLOGY STUDIES/RESULTS: Nm Gi Blood Loss  06/04/2015  CLINICAL DATA:  Possible upper GI bleed. Patient taking anti-inflammatories for the past 3 weeks. EXAM: NUCLEAR MEDICINE GASTROINTESTINAL BLEEDING SCAN TECHNIQUE: Sequential abdominal images were obtained following intravenous administration of Tc-25m labeled red blood cells. RADIOPHARMACEUTICALS:  27.0 MCi Tc-55m in-vitro  labeled red cells. COMPARISON:  None. FINDINGS: There is a vague area of activity noted in the right upper quadrant just below the right kidney and lateral to the right iliac vein. I do not see any significant movement. This could be due to focal inflammation/diverticulitis, polyp, tumor or vascular abnormality such as aneurysm or varices. It is possible it could be a small bleeding focus in the hepatic flexure region of the colon but I think it is unlikely. I do not see any obvious findings for an upper GI bleed. IMPRESSION: Equivocal area of activity noted in the right upper quadrant, in the region of the hepatic flexure of the colon without obvious progression/movement. This is unlikely an active bleeding site and is more likely a false positive due to inflammation, polyp, tumor or vascular abnormality. Electronically  Signed   By: Marijo Sanes M.D.   On: 06/04/2015 08:55   Ir Angiogram Visceral Selective  06/05/2015  CLINICAL DATA:  GI bleed. Tagged red cell scintigraphy showed a Focal area of activity in the right upper abdomen. CTA suggested acute hemorrhage in into the lumen of the splenic flexure of the colon. Descending and sigmoid diverticulosis. EXAM: SELECTIVE VISCERAL ARTERIOGRAPHY; IR ULTRASOUND GUIDANCE VASC ACCESS RIGHT ANESTHESIA/SEDATION: Intravenous Fentanyl and Versed were administered as conscious sedation during continuous cardiorespiratory monitoring by the radiology RN, with a total moderate sedation time of 40 minutes. MEDICATIONS: Lidocaine 1% subcutaneous CONTRAST:  134mL OMNIPAQUE IOHEXOL 300 MG/ML  SOLN PROCEDURE: The procedure, risks (including but not limited to bleeding, infection, organ damage ), benefits, and alternatives were explained to the patient. Questions regarding the procedure were encouraged and answered. The patient understands and consents to the procedure. Right femoral region prepped and draped in usual sterile fashion. Maximal barrier sterile technique was  utilized including caps, mask, sterile gowns, sterile gloves, sterile drape, hand hygiene and skin antiseptic. The right common femoral artery was localized under ultrasound. Under real-time ultrasound guidance, the vessel was accessed with a 21-gauge micropuncture needle, exchanged over a 018 guidewire for a transitional dilator, through which a 035 guidewire was advanced. Over this, a 5 [place] vascular sheath was placed, through which a 5 [place] C2 catheter was advanced and used to selectively catheterize the superior mesenteric artery for selective arteriography in multiple projections. The C2 was then utilized to selectively catheterize the inferior mesenteric artery for selective arteriography in multiple projections. The C2 was then utilized to selectively catheterize the celiac axis for selective arteriography. The catheter and sheath were removed and hemostasis achieved with the aid of the Exoseal device after confirmatory femoral arteriography. The patient tolerated the procedure well. COMPLICATIONS: None immediate FINDINGS: No evidence of active extravasation, early draining vein, AVM, or other lesion to suggest a site or etiology of the patient's GI bleeding. Replaced right hepatic arterial supply from the superior mesenteric artery, an anatomic variant. Venous phase confirms patency of the IMV and portal venous system. IMPRESSION: 1. Negative three-vessel mesenteric arteriogram. No evidence of active extravasation or other focal lesion to suggest etiology of GI bleed. Electronically Signed   By: Lucrezia Europe M.D.   On: 06/05/2015 13:49   Ir Angiogram Visceral Selective  06/05/2015  CLINICAL DATA:  GI bleed. Tagged red cell scintigraphy showed a Focal area of activity in the right upper abdomen. CTA suggested acute hemorrhage in into the lumen of the splenic flexure of the colon. Descending and sigmoid diverticulosis. EXAM: SELECTIVE VISCERAL ARTERIOGRAPHY; IR ULTRASOUND GUIDANCE VASC ACCESS RIGHT  ANESTHESIA/SEDATION: Intravenous Fentanyl and Versed were administered as conscious sedation during continuous cardiorespiratory monitoring by the radiology RN, with a total moderate sedation time of 40 minutes. MEDICATIONS: Lidocaine 1% subcutaneous CONTRAST:  177mL OMNIPAQUE IOHEXOL 300 MG/ML  SOLN PROCEDURE: The procedure, risks (including but not limited to bleeding, infection, organ damage ), benefits, and alternatives were explained to the patient. Questions regarding the procedure were encouraged and answered. The patient understands and consents to the procedure. Right femoral region prepped and draped in usual sterile fashion. Maximal barrier sterile technique was utilized including caps, mask, sterile gowns, sterile gloves, sterile drape, hand hygiene and skin antiseptic. The right common femoral artery was localized under ultrasound. Under real-time ultrasound guidance, the vessel was accessed with a 21-gauge micropuncture needle, exchanged over a 018 guidewire for a transitional dilator, through which a 035 guidewire was advanced.  Over this, a 5 [place] vascular sheath was placed, through which a 5 [place] C2 catheter was advanced and used to selectively catheterize the superior mesenteric artery for selective arteriography in multiple projections. The C2 was then utilized to selectively catheterize the inferior mesenteric artery for selective arteriography in multiple projections. The C2 was then utilized to selectively catheterize the celiac axis for selective arteriography. The catheter and sheath were removed and hemostasis achieved with the aid of the Exoseal device after confirmatory femoral arteriography. The patient tolerated the procedure well. COMPLICATIONS: None immediate FINDINGS: No evidence of active extravasation, early draining vein, AVM, or other lesion to suggest a site or etiology of the patient's GI bleeding. Replaced right hepatic arterial supply from the superior mesenteric artery, an  anatomic variant. Venous phase confirms patency of the IMV and portal venous system. IMPRESSION: 1. Negative three-vessel mesenteric arteriogram. No evidence of active extravasation or other focal lesion to suggest etiology of GI bleed. Electronically Signed   By: Lucrezia Europe M.D.   On: 06/05/2015 13:49   Ir Angiogram Visceral Selective  06/05/2015  CLINICAL DATA:  GI bleed. Tagged red cell scintigraphy showed a Focal area of activity in the right upper abdomen. CTA suggested acute hemorrhage in into the lumen of the splenic flexure of the colon. Descending and sigmoid diverticulosis. EXAM: SELECTIVE VISCERAL ARTERIOGRAPHY; IR ULTRASOUND GUIDANCE VASC ACCESS RIGHT ANESTHESIA/SEDATION: Intravenous Fentanyl and Versed were administered as conscious sedation during continuous cardiorespiratory monitoring by the radiology RN, with a total moderate sedation time of 40 minutes. MEDICATIONS: Lidocaine 1% subcutaneous CONTRAST:  165mL OMNIPAQUE IOHEXOL 300 MG/ML  SOLN PROCEDURE: The procedure, risks (including but not limited to bleeding, infection, organ damage ), benefits, and alternatives were explained to the patient. Questions regarding the procedure were encouraged and answered. The patient understands and consents to the procedure. Right femoral region prepped and draped in usual sterile fashion. Maximal barrier sterile technique was utilized including caps, mask, sterile gowns, sterile gloves, sterile drape, hand hygiene and skin antiseptic. The right common femoral artery was localized under ultrasound. Under real-time ultrasound guidance, the vessel was accessed with a 21-gauge micropuncture needle, exchanged over a 018 guidewire for a transitional dilator, through which a 035 guidewire was advanced. Over this, a 5 [place] vascular sheath was placed, through which a 5 [place] C2 catheter was advanced and used to selectively catheterize the superior mesenteric artery for selective arteriography in multiple  projections. The C2 was then utilized to selectively catheterize the inferior mesenteric artery for selective arteriography in multiple projections. The C2 was then utilized to selectively catheterize the celiac axis for selective arteriography. The catheter and sheath were removed and hemostasis achieved with the aid of the Exoseal device after confirmatory femoral arteriography. The patient tolerated the procedure well. COMPLICATIONS: None immediate FINDINGS: No evidence of active extravasation, early draining vein, AVM, or other lesion to suggest a site or etiology of the patient's GI bleeding. Replaced right hepatic arterial supply from the superior mesenteric artery, an anatomic variant. Venous phase confirms patency of the IMV and portal venous system. IMPRESSION: 1. Negative three-vessel mesenteric arteriogram. No evidence of active extravasation or other focal lesion to suggest etiology of GI bleed. Electronically Signed   By: Lucrezia Europe M.D.   On: 06/05/2015 13:49   Ir US Guide Vasc Access Right  06/05/2015  CLINICAL DATA:  GI bleed. Tagged red cell scintigraphy showed a Focal area of activity in the right upper abdomen. CTA suggested acute hemorrhage in into the lumen of  the splenic flexure of the colon. Descending and sigmoid diverticulosis. EXAM: SELECTIVE VISCERAL ARTERIOGRAPHY; IR ULTRASOUND GUIDANCE VASC ACCESS RIGHT ANESTHESIA/SEDATION: Intravenous Fentanyl and Versed were administered as conscious sedation during continuous cardiorespiratory monitoring by the radiology RN, with a total moderate sedation time of 40 minutes. MEDICATIONS: Lidocaine 1% subcutaneous CONTRAST:  160mL OMNIPAQUE IOHEXOL 300 MG/ML  SOLN PROCEDURE: The procedure, risks (including but not limited to bleeding, infection, organ damage ), benefits, and alternatives were explained to the patient. Questions regarding the procedure were encouraged and answered. The patient understands and consents to the procedure. Right femoral  region prepped and draped in usual sterile fashion. Maximal barrier sterile technique was utilized including caps, mask, sterile gowns, sterile gloves, sterile drape, hand hygiene and skin antiseptic. The right common femoral artery was localized under ultrasound. Under real-time ultrasound guidance, the vessel was accessed with a 21-gauge micropuncture needle, exchanged over a 018 guidewire for a transitional dilator, through which a 035 guidewire was advanced. Over this, a 5 [place] vascular sheath was placed, through which a 5 [place] C2 catheter was advanced and used to selectively catheterize the superior mesenteric artery for selective arteriography in multiple projections. The C2 was then utilized to selectively catheterize the inferior mesenteric artery for selective arteriography in multiple projections. The C2 was then utilized to selectively catheterize the celiac axis for selective arteriography. The catheter and sheath were removed and hemostasis achieved with the aid of the Exoseal device after confirmatory femoral arteriography. The patient tolerated the procedure well. COMPLICATIONS: None immediate FINDINGS: No evidence of active extravasation, early draining vein, AVM, or other lesion to suggest a site or etiology of the patient's GI bleeding. Replaced right hepatic arterial supply from the superior mesenteric artery, an anatomic variant. Venous phase confirms patency of the IMV and portal venous system. IMPRESSION: 1. Negative three-vessel mesenteric arteriogram. No evidence of active extravasation or other focal lesion to suggest etiology of GI bleed. Electronically Signed   By: Lucrezia Europe M.D.   On: 06/05/2015 13:49   Ct Angio Abd/pel W/ And/or W/o  06/05/2015  CLINICAL DATA:  Acute onset of GI bleeding. Bloody bowel movements. Decreased hemoglobin. Initial encounter. EXAM: CTA ABDOMEN AND PELVIS wITHOUT AND WITH CONTRAST TECHNIQUE: Multidetector CT imaging of the abdomen and pelvis was  performed using the standard protocol during bolus administration of intravenous contrast. Multiplanar reconstructed images and MIPs were obtained and reviewed to evaluate the vascular anatomy. CONTRAST:  190mL OMNIPAQUE IOHEXOL 350 MG/ML SOLN COMPARISON:  CT of the abdomen and pelvis from 09/18/2010 FINDINGS: Mild bibasilar atelectasis is noted. A moderate hiatal hernia is noted. A small amount of contrast is noted within the splenic flexure of the colon, compatible with acute hemorrhage into the splenic flexure of the colon. The abdominal aorta is grossly unremarkable in appearance, without calcific atherosclerotic disease. The celiac trunk, superior mesenteric artery, bilateral renal arteries and inferior mesenteric artery remain fully patent. The inferior vena cava is unremarkable in appearance. The liver and spleen are unremarkable in appearance. The patient is status post cholecystectomy, with clips noted at the gallbladder fossa. The pancreas and adrenal glands are unremarkable. A 3 mm stone is noted at the interpole region of the right kidney. A 2 mm stone is noted at the upper pole of the left kidney. The kidneys are otherwise unremarkable. There is no evidence of hydronephrosis. No obstructing ureteral stones are identified. There is Richter herniation of the ascending colon into a moderate to large right posterior flank hernia, without evidence of obstruction.  Scattered diverticulosis is noted along the descending and sigmoid colon, without evidence of diverticulitis. No free fluid is identified. The small bowel is unremarkable in appearance. The stomach is within normal limits. No acute vascular abnormalities are seen. The appendix is normal in caliber and contains air, without evidence of appendicitis. The bladder is mildly distended and grossly unremarkable. Postoperative change is noted along the anterior pelvic wall. The patient is status post hysterectomy. The ovaries are relatively symmetric. No  suspicious adnexal masses are seen. No inguinal lymphadenopathy is seen. No acute osseous abnormalities are identified. There is mild grade 1 anterolisthesis of L4 on L5, reflecting underlying facet disease. Review of the MIP images confirms the above findings. IMPRESSION: 1. Acute hemorrhage noted into the splenic flexure of the colon, corresponding to the patient's GI bleeding. 2. Moderate hiatal hernia noted. 3. Scattered diverticulosis along the descending and sigmoid colon, without evidence of diverticulitis. 4. Richter herniation of the ascending colon into a moderate to large right posterior flank hernia, without evidence of obstruction. 5. Small nonobstructing bilateral renal stones, measuring up to 3 mm in size. 6. Mild bibasilar atelectasis noted. These results were called by telephone at the time of interpretation on 06/05/2015 at 3:16 am to [facility] on [facility], who verbally acknowledged these results. Electronically Signed   By: Garald Balding M.D.   On: 06/05/2015 03:31    Oren Binet, MD  [facility] Pager:[phone]  If 7PM-7AM, please contact night-coverage [email or web address] Password [email or web address] 06/07/2015, 3:29 PM   LOS: 9 days

## 2015-06-07 NOTE — Progress Notes (Signed)
Physical Therapy Treatment Patient Details Name: [name] MRN: [id number] DOB: Oct 25, 1945 Today's Date: 2015-06-09    History of Present Illness pt is a 69 y.o. female with h/o IBS, DDD, diverticulosis, admitted with 1 week of melena.  Work up included units of PRBC's, colonoscopy and endoscopy.    PT Comments    Pt making good progress.  Follow Up Recommendations  Home health PT;Supervision - Intermittent     Equipment Recommendations  None recommended by PT    Recommendations for Other Services       Precautions / Restrictions Precautions Precautions: Fall Restrictions Weight Bearing Restrictions: No    Mobility  Bed Mobility Overal bed mobility: Modified Independent Bed Mobility: Supine to Sit;Sit to Supine     Supine to sit: Modified independent (Device/Increase time) Sit to supine: Modified independent (Device/Increase time)      Transfers Overall transfer level: Modified independent   Transfers: Sit to/from Stand Sit to Stand: Modified independent (Device/Increase time)            Ambulation/Gait Ambulation/Gait assistance: Min guard Ambulation Distance (Feet): 200 Feet Assistive device: None Gait Pattern/deviations: Step-through pattern;Staggering right;Staggering left Gait velocity: reduced Gait velocity interpretation: Below normal speed for age/gender General Gait Details: Pt with occasional stagger but able to regain balance without assist. Uses wall rail at times   Stairs            Wheelchair Mobility    Modified Rankin (Stroke Patients Only)       Balance   Sitting-balance support: No upper extremity supported;Feet supported Sitting balance-Leahy Scale: Normal     Standing balance support: No upper extremity supported;During functional activity Standing balance-Leahy Scale: Fair                      Cognition Arousal/Alertness: Awake/alert Behavior During Therapy: WFL for tasks  assessed/performed Overall Cognitive Status: Within Functional Limits for tasks assessed                      Exercises      General Comments        Pertinent Vitals/Pain      Home Living                      Prior Function            PT Goals (current goals can now be found in the care plan section) Progress towards PT goals: Progressing toward goals    Frequency  Min 3X/week    PT Plan Current plan remains appropriate    Co-evaluation             End of Session   Activity Tolerance: Patient tolerated treatment well Patient left: in bed;with call bell/phone within reach     Time: [id number] PT Time Calculation (min) (ACUTE ONLY): 9 min  Charges:  $Gait Training: 8-22 mins                    G Codes:      [name] 09-Jun-2015, 4:33 PM [facility] PT [phone]

## 2015-06-07 NOTE — Care Management Note (Signed)
Case Management Note  Patient Details  Name: [name] MRN: [id number] Date of Birth: 06/04/1946  Subjective/Objective:   Patient chose [facility] for HHPT referral made to [place] with [place] will begin 24-48 hrs post dc.                   Action/Plan:   Expected Discharge Date:                  Expected Discharge Plan:  [place]  In-House Referral:     Discharge planning Services  CM Consult  Post Acute Care Choice:  Home Health Choice offered to:  Patient  DME Arranged:    DME Agency:     HH Arranged:  PT [place]:  Mobile  Status of Service:  Completed, signed off  Medicare Important Message Given:  Yes Date Medicare IM Given:    Medicare IM give by:    Date Additional Medicare IM Given:    Additional Medicare Important Message give by:     If discussed at [place] of Stay Meetings, dates discussed:    Additional Comments:  Zenon Mayo, RN 06/07/2015, 4:39 PM

## 2015-06-07 NOTE — Progress Notes (Signed)
8 Days Post-Op  Subjective: She feels fine, I removed dressing from [place].  Still having black colored stools.  Objective: Vital signs in last 24 hours: Temp:  [98 F (36.7 C)-98.5 F (36.9 C)] 98.4 F (36.9 C) (12/16 0517) Pulse Rate:  [83-93] 83 (12/16 0517) Resp:  [18-19] 18 (12/16 0517) BP: (97-119)/(49-62) 97/49 mmHg (12/16 0517) SpO2:  [97 %-99 %] 97 % (12/16 0517) Last BM Date: 06/06/15 420 PO recorded  Diet: full liquids 500 urine recorded BM x 2 recorded Afebrile, BP down 3AM,  H/h stable, minimal drift.  Intake/Output from previous day: 12/15 0701 - 12/16 0700 In: 860 [P.O.:420; I.V.:440] Out: 500 [Urine:500] Intake/Output this shift:    General appearance: alert, cooperative and no distress GI: soft, non-tender; bowel sounds normal; no masses,  no organomegaly  Lab Results:   Recent Labs  06/06/15 1916 06/07/15 0755  WBC 6.6 6.6  HGB 9.7* 9.4*  HCT 30.0* 28.3*  PLT 233 215    BMET  Recent Labs  06/06/15 0608 06/07/15 0510  NA 140 139  K 3.3* 3.2*  CL 108 110  CO2 23 24  GLUCOSE 98 113*  BUN 20 12  CREATININE 0.86 0.78  CALCIUM 8.2* 7.9*   PT/INR No results for input(s): LABPROT, INR in the last 72 hours.   Recent Labs Lab 06/04/15 0141  AST 28  ALT 22  ALKPHOS 39  BILITOT 1.0  PROT 4.7*  ALBUMIN 2.7*     Lipase     Component Value Date/Time   LIPASE 36.0 02/04/2010 1102     Studies/Results: Ir Angiogram Visceral Selective  06/05/2015  CLINICAL DATA:  GI bleed. Tagged red cell scintigraphy showed a Focal area of activity in the right upper abdomen. CTA suggested acute hemorrhage in into the lumen of the splenic flexure of the colon. Descending and sigmoid diverticulosis. EXAM: SELECTIVE VISCERAL ARTERIOGRAPHY; IR ULTRASOUND GUIDANCE VASC ACCESS RIGHT ANESTHESIA/SEDATION: Intravenous Fentanyl and Versed were administered as conscious sedation during continuous cardiorespiratory monitoring by the radiology RN, with a total  moderate sedation time of 40 minutes. MEDICATIONS: Lidocaine 1% subcutaneous CONTRAST:  155mL OMNIPAQUE IOHEXOL 300 MG/ML  SOLN PROCEDURE: The procedure, risks (including but not limited to bleeding, infection, organ damage ), benefits, and alternatives were explained to the patient. Questions regarding the procedure were encouraged and answered. The patient understands and consents to the procedure. Right femoral region prepped and draped in usual sterile fashion. Maximal barrier sterile technique was utilized including caps, mask, sterile gowns, sterile gloves, sterile drape, hand hygiene and skin antiseptic. The right common femoral artery was localized under ultrasound. Under real-time ultrasound guidance, the vessel was accessed with a 21-gauge micropuncture needle, exchanged over a 018 guidewire for a transitional dilator, through which a 035 guidewire was advanced. Over this, a 5 [place] vascular sheath was placed, through which a 5 [place] C2 catheter was advanced and used to selectively catheterize the superior mesenteric artery for selective arteriography in multiple projections. The C2 was then utilized to selectively catheterize the inferior mesenteric artery for selective arteriography in multiple projections. The C2 was then utilized to selectively catheterize the celiac axis for selective arteriography. The catheter and sheath were removed and hemostasis achieved with the aid of the Exoseal device after confirmatory femoral arteriography. The patient tolerated the procedure well. COMPLICATIONS: None immediate FINDINGS: No evidence of active extravasation, early draining vein, AVM, or other lesion to suggest a site or etiology of the patient's GI bleeding. Replaced right hepatic arterial supply from the  superior mesenteric artery, an anatomic variant. Venous phase confirms patency of the IMV and portal venous system. IMPRESSION: 1. Negative three-vessel mesenteric arteriogram. No evidence of active  extravasation or other focal lesion to suggest etiology of GI bleed. Electronically Signed   By: Lucrezia Europe M.D.   On: 06/05/2015 13:49   Ir Angiogram Visceral Selective  06/05/2015  CLINICAL DATA:  GI bleed. Tagged red cell scintigraphy showed a Focal area of activity in the right upper abdomen. CTA suggested acute hemorrhage in into the lumen of the splenic flexure of the colon. Descending and sigmoid diverticulosis. EXAM: SELECTIVE VISCERAL ARTERIOGRAPHY; IR ULTRASOUND GUIDANCE VASC ACCESS RIGHT ANESTHESIA/SEDATION: Intravenous Fentanyl and Versed were administered as conscious sedation during continuous cardiorespiratory monitoring by the radiology RN, with a total moderate sedation time of 40 minutes. MEDICATIONS: Lidocaine 1% subcutaneous CONTRAST:  176mL OMNIPAQUE IOHEXOL 300 MG/ML  SOLN PROCEDURE: The procedure, risks (including but not limited to bleeding, infection, organ damage ), benefits, and alternatives were explained to the patient. Questions regarding the procedure were encouraged and answered. The patient understands and consents to the procedure. Right femoral region prepped and draped in usual sterile fashion. Maximal barrier sterile technique was utilized including caps, mask, sterile gowns, sterile gloves, sterile drape, hand hygiene and skin antiseptic. The right common femoral artery was localized under ultrasound. Under real-time ultrasound guidance, the vessel was accessed with a 21-gauge micropuncture needle, exchanged over a 018 guidewire for a transitional dilator, through which a 035 guidewire was advanced. Over this, a 5 [place] vascular sheath was placed, through which a 5 [place] C2 catheter was advanced and used to selectively catheterize the superior mesenteric artery for selective arteriography in multiple projections. The C2 was then utilized to selectively catheterize the inferior mesenteric artery for selective arteriography in multiple projections. The C2 was then utilized  to selectively catheterize the celiac axis for selective arteriography. The catheter and sheath were removed and hemostasis achieved with the aid of the Exoseal device after confirmatory femoral arteriography. The patient tolerated the procedure well. COMPLICATIONS: None immediate FINDINGS: No evidence of active extravasation, early draining vein, AVM, or other lesion to suggest a site or etiology of the patient's GI bleeding. Replaced right hepatic arterial supply from the superior mesenteric artery, an anatomic variant. Venous phase confirms patency of the IMV and portal venous system. IMPRESSION: 1. Negative three-vessel mesenteric arteriogram. No evidence of active extravasation or other focal lesion to suggest etiology of GI bleed. Electronically Signed   By: Lucrezia Europe M.D.   On: 06/05/2015 13:49   Ir Angiogram Visceral Selective  06/05/2015  CLINICAL DATA:  GI bleed. Tagged red cell scintigraphy showed a Focal area of activity in the right upper abdomen. CTA suggested acute hemorrhage in into the lumen of the splenic flexure of the colon. Descending and sigmoid diverticulosis. EXAM: SELECTIVE VISCERAL ARTERIOGRAPHY; IR ULTRASOUND GUIDANCE VASC ACCESS RIGHT ANESTHESIA/SEDATION: Intravenous Fentanyl and Versed were administered as conscious sedation during continuous cardiorespiratory monitoring by the radiology RN, with a total moderate sedation time of 40 minutes. MEDICATIONS: Lidocaine 1% subcutaneous CONTRAST:  158mL OMNIPAQUE IOHEXOL 300 MG/ML  SOLN PROCEDURE: The procedure, risks (including but not limited to bleeding, infection, organ damage ), benefits, and alternatives were explained to the patient. Questions regarding the procedure were encouraged and answered. The patient understands and consents to the procedure. Right femoral region prepped and draped in usual sterile fashion. Maximal barrier sterile technique was utilized including caps, mask, sterile gowns, sterile gloves, sterile drape, hand  hygiene and  skin antiseptic. The right common femoral artery was localized under ultrasound. Under real-time ultrasound guidance, the vessel was accessed with a 21-gauge micropuncture needle, exchanged over a 018 guidewire for a transitional dilator, through which a 035 guidewire was advanced. Over this, a 5 [place] vascular sheath was placed, through which a 5 [place] C2 catheter was advanced and used to selectively catheterize the superior mesenteric artery for selective arteriography in multiple projections. The C2 was then utilized to selectively catheterize the inferior mesenteric artery for selective arteriography in multiple projections. The C2 was then utilized to selectively catheterize the celiac axis for selective arteriography. The catheter and sheath were removed and hemostasis achieved with the aid of the Exoseal device after confirmatory femoral arteriography. The patient tolerated the procedure well. COMPLICATIONS: None immediate FINDINGS: No evidence of active extravasation, early draining vein, AVM, or other lesion to suggest a site or etiology of the patient's GI bleeding. Replaced right hepatic arterial supply from the superior mesenteric artery, an anatomic variant. Venous phase confirms patency of the IMV and portal venous system. IMPRESSION: 1. Negative three-vessel mesenteric arteriogram. No evidence of active extravasation or other focal lesion to suggest etiology of GI bleed. Electronically Signed   By: Lucrezia Europe M.D.   On: 06/05/2015 13:49   Ir US Guide Vasc Access Right  06/05/2015  CLINICAL DATA:  GI bleed. Tagged red cell scintigraphy showed a Focal area of activity in the right upper abdomen. CTA suggested acute hemorrhage in into the lumen of the splenic flexure of the colon. Descending and sigmoid diverticulosis. EXAM: SELECTIVE VISCERAL ARTERIOGRAPHY; IR ULTRASOUND GUIDANCE VASC ACCESS RIGHT ANESTHESIA/SEDATION: Intravenous Fentanyl and Versed were administered as conscious  sedation during continuous cardiorespiratory monitoring by the radiology RN, with a total moderate sedation time of 40 minutes. MEDICATIONS: Lidocaine 1% subcutaneous CONTRAST:  165mL OMNIPAQUE IOHEXOL 300 MG/ML  SOLN PROCEDURE: The procedure, risks (including but not limited to bleeding, infection, organ damage ), benefits, and alternatives were explained to the patient. Questions regarding the procedure were encouraged and answered. The patient understands and consents to the procedure. Right femoral region prepped and draped in usual sterile fashion. Maximal barrier sterile technique was utilized including caps, mask, sterile gowns, sterile gloves, sterile drape, hand hygiene and skin antiseptic. The right common femoral artery was localized under ultrasound. Under real-time ultrasound guidance, the vessel was accessed with a 21-gauge micropuncture needle, exchanged over a 018 guidewire for a transitional dilator, through which a 035 guidewire was advanced. Over this, a 5 [place] vascular sheath was placed, through which a 5 [place] C2 catheter was advanced and used to selectively catheterize the superior mesenteric artery for selective arteriography in multiple projections. The C2 was then utilized to selectively catheterize the inferior mesenteric artery for selective arteriography in multiple projections. The C2 was then utilized to selectively catheterize the celiac axis for selective arteriography. The catheter and sheath were removed and hemostasis achieved with the aid of the Exoseal device after confirmatory femoral arteriography. The patient tolerated the procedure well. COMPLICATIONS: None immediate FINDINGS: No evidence of active extravasation, early draining vein, AVM, or other lesion to suggest a site or etiology of the patient's GI bleeding. Replaced right hepatic arterial supply from the superior mesenteric artery, an anatomic variant. Venous phase confirms patency of the IMV and portal venous  system. IMPRESSION: 1. Negative three-vessel mesenteric arteriogram. No evidence of active extravasation or other focal lesion to suggest etiology of GI bleed. Electronically Signed   By: Lucrezia Europe M.D.   On:  06/05/2015 13:49    Medications: . sodium chloride   Intravenous Once  . citalopram  20 mg Oral Daily  . pantoprazole (PROTONIX) IV  40 mg Intravenous Q12H  . potassium chloride  40 mEq Oral Once    Assessment/Plan Lower GI Bleed (CT angio shows extravasation at Splenic flexure) Transfused 5 units PRBC Hx of IBS/diverticulosis/anal fissure Depression Anemia GERD Antibiotics: None DVT: SCD   Plan:  Currently she is stable, this is her first bleed like this.  Continue to monitor, if she remains stable we may be able to avoid partial colectomy.    LOS: 9 days    Kanin Lia 06/07/2015

## 2015-06-08 LAB — CBC
HCT: 28.6 % — ABNORMAL LOW (ref 36.0–46.0)
Hemoglobin: 9.3 g/dL — ABNORMAL LOW (ref 12.0–15.0)
MCH: 30.8 pg (ref 26.0–34.0)
MCHC: 32.5 g/dL (ref 30.0–36.0)
MCV: 94.7 fL (ref 78.0–100.0)
PLATELETS: 235 10*3/uL (ref 150–400)
RBC: 3.02 MIL/uL — ABNORMAL LOW (ref 3.87–5.11)
RDW: 18.4 % — AB (ref 11.5–15.5)
WBC: 6.4 10*3/uL (ref 4.0–10.5)

## 2015-06-08 NOTE — Progress Notes (Signed)
PATIENT DETAILS Name: [name] Age: 69 y.o. Sex: female Date of Birth: 1945-09-12 Admit Date: 05/29/2015 Admitting Physician Edwin Dada, MD [id number] Plotnikov, MD  Subjective: No BM for over 2 days  Assessment/Plan: Principal Problem: Diverticular bleeding: Seems to be slowly improving-and has slowed down somewhat. Hemoglobin now stable after a total of 5 units of PRBC transfusion.Colonoscopy on 12/8 shows diverticulosis, EGD negative for bleeding foci. Tagged RBC scan who was equivocal, however a CTA abdomen 12/14 was suggestive of acute hemorrhage in the splenic flexure area. Mesenteric angiogram on 12/14 was negative. No BM for 48 hours-If continues to improve-suspect home tomorrow morning.    Active Problems: Acute blood loss anemia: Secondary to above, transfused 5 units of PRBC so far -last on12/14-hemoglobin remains stable. Continue to follow CBC   Multiple gastric polyps: Noted on EGD - path reveals fundic gland polyps   Depression/anxiety: Continue Celexa  Deconditioning: PT evaluation-HHPT recommended  Disposition: Remain inpatient-home tomorrow if clinical improvement continues  Antimicrobial agents  See below  Anti-infectives    None      DVT Prophylaxis: SCD's  Code Status: Full code   Family Communication Son Dr. Boykin Reaper at [phone]   Procedures: EGD 12/7 Colonoscopy 12/8 Mesentric Angio 12/14  CONSULTS:  GI, general surgery and IR  Time spent 20 minutes-Greater than 50% of this time was spent in counseling, explanation of diagnosis, planning of further management, and coordination of care.  MEDICATIONS: Scheduled Meds: . sodium chloride   Intravenous Once  . citalopram  20 mg Oral Daily  . pantoprazole (PROTONIX) IV  40 mg Intravenous Q12H   Continuous Infusions: . sodium chloride 1,000 mL (06/07/15 1641)   PRN Meds:.menthol-cetylpyridinium, oxyCODONE, traMADol    PHYSICAL EXAM: Vital  signs in last 24 hours: Filed Vitals:   06/07/15 1451 06/07/15 2214 06/08/15 0210 06/08/15 0600  BP: 95/43 102/61 118/54 99/45  Pulse: 79 79 80 82  Temp: 97.8 F (36.6 C) 98.2 F (36.8 C) 98.2 F (36.8 C) 98.3 F (36.8 C)  TempSrc: Oral Oral Oral Oral  Resp: 19 16 16 16   Height:      Weight:      SpO2: 96% 97% 98% 96%    Weight change:  Filed Weights   05/30/15 1023 05/31/15 1757 06/02/15 0437  Weight: 72.576 kg (160 lb) 74 kg (163 lb 2.3 oz) 73.8 kg (162 lb 11.2 oz)   Body mass index is 27.91 kg/(m^2).   Gen Exam: Awake and alert with clear speech.   Neck: Supple, No JVD.   Chest: B/L Clear-no rhonchi CVS: S1 S2 Regular Abdomen: soft, BS +, non tender, non distended.  Extremities: no edema, lower extremities warm to touch. Neurologic: Non Focal.   Skin: No Rash.   Wounds: N/A.    Intake/Output from previous day:  Intake/Output Summary (Last 24 hours) at 06/08/15 1436 Last data filed at 06/08/15 1214  Gross per 24 hour  Intake   1100 ml  Output   2050 ml  Net   -950 ml     LAB RESULTS: CBC  Recent Labs Lab 06/05/15 2035 06/06/15 0608 06/06/15 1916 06/07/15 0755 06/08/15 0546  WBC 7.4 7.4 6.6 6.6 6.4  HGB 10.4* 9.9* 9.7* 9.4* 9.3*  HCT 31.3* 30.3* 30.0* 28.3* 28.6*  PLT 215 204 233 215 235  MCV 90.5 91.0 92.3 92.5 94.7  MCH 30.1 29.7 29.8 30.7 30.8  MCHC 33.2 32.7  32.3 33.2 32.5  RDW 16.4* 17.3* 17.9* 18.0* 18.4*  LYMPHSABS 1.8 1.7 1.7 2.3  --   MONOABS 0.6 0.6 0.4 0.4  --   EOSABS 0.1 0.1 0.1 0.2  --   BASOSABS 0.0 0.0 0.0 0.0  --     Chemistries   Recent Labs Lab 06/02/15 0441 06/04/15 0141 06/05/15 0445 06/06/15 0608 06/07/15 0510  NA 139 139 137 140 139  K 4.0 3.9 3.6 3.3* 3.2*  CL 107 107 106 108 110  CO2 27 26 23 23 24   GLUCOSE 111* 106* 97 98 113*  BUN 8 13 24* 20 12  CREATININE 0.75 0.78 0.81 0.86 0.78  CALCIUM 8.1* 8.0* 8.1* 8.2* 7.9*    CBG: No results for input(s): GLUCAP in the last 168 hours.  GFR Estimated  Creatinine Clearance: 63.4 mL/min (by C-G formula based on Cr of 0.78).  Coagulation profile  Recent Labs Lab 06/04/15 0141  INR 1.30    Cardiac Enzymes No results for input(s): CKMB, TROPONINI, MYOGLOBIN in the last 168 hours.  Invalid input(s): CK  Invalid input(s): POCBNP No results for input(s): DDIMER in the last 72 hours. No results for input(s): HGBA1C in the last 72 hours. No results for input(s): CHOL, HDL, LDLCALC, TRIG, CHOLHDL, LDLDIRECT in the last 72 hours. No results for input(s): TSH, T4TOTAL, T3FREE, THYROIDAB in the last 72 hours.  Invalid input(s): FREET3 No results for input(s): VITAMINB12, FOLATE, FERRITIN, TIBC, IRON, RETICCTPCT in the last 72 hours. No results for input(s): LIPASE, AMYLASE in the last 72 hours.  Urine Studies No results for input(s): UHGB, CRYS in the last 72 hours.  Invalid input(s): UACOL, UAPR, USPG, UPH, UTP, UGL, UKET, UBIL, UNIT, UROB, ULEU, UEPI, UWBC, URBC, UBAC, CAST, UCOM, BILUA  MICROBIOLOGY: Recent Results (from the past 240 hour(s))  MRSA PCR Screening     Status: None   Collection Time: 05/29/15  5:47 PM  Result Value Ref Range Status   MRSA by PCR NEGATIVE NEGATIVE Final    Comment:        The GeneXpert MRSA Assay (FDA approved for NASAL specimens only), is one component of a comprehensive MRSA colonization surveillance program. It is not intended to diagnose MRSA infection nor to guide or monitor treatment for MRSA infections.     RADIOLOGY STUDIES/RESULTS: Nm Gi Blood Loss  06/04/2015  CLINICAL DATA:  Possible upper GI bleed. Patient taking anti-inflammatories for the past 3 weeks. EXAM: NUCLEAR MEDICINE GASTROINTESTINAL BLEEDING SCAN TECHNIQUE: Sequential abdominal images were obtained following intravenous administration of Tc-26m labeled red blood cells. RADIOPHARMACEUTICALS:  27.0 MCi Tc-7m in-vitro labeled red cells. COMPARISON:  None. FINDINGS: There is a vague area of activity noted in the right upper  quadrant just below the right kidney and lateral to the right iliac vein. I do not see any significant movement. This could be due to focal inflammation/diverticulitis, polyp, tumor or vascular abnormality such as aneurysm or varices. It is possible it could be a small bleeding focus in the hepatic flexure region of the colon but I think it is unlikely. I do not see any obvious findings for an upper GI bleed. IMPRESSION: Equivocal area of activity noted in the right upper quadrant, in the region of the hepatic flexure of the colon without obvious progression/movement. This is unlikely an active bleeding site and is more likely a false positive due to inflammation, polyp, tumor or vascular abnormality. Electronically Signed   By: Marijo Sanes M.D.   On: 06/04/2015 08:55  Ir Angiogram Visceral Selective  06/05/2015  CLINICAL DATA:  GI bleed. Tagged red cell scintigraphy showed a Focal area of activity in the right upper abdomen. CTA suggested acute hemorrhage in into the lumen of the splenic flexure of the colon. Descending and sigmoid diverticulosis. EXAM: SELECTIVE VISCERAL ARTERIOGRAPHY; IR ULTRASOUND GUIDANCE VASC ACCESS RIGHT ANESTHESIA/SEDATION: Intravenous Fentanyl and Versed were administered as conscious sedation during continuous cardiorespiratory monitoring by the radiology RN, with a total moderate sedation time of 40 minutes. MEDICATIONS: Lidocaine 1% subcutaneous CONTRAST:  176mL OMNIPAQUE IOHEXOL 300 MG/ML  SOLN PROCEDURE: The procedure, risks (including but not limited to bleeding, infection, organ damage ), benefits, and alternatives were explained to the patient. Questions regarding the procedure were encouraged and answered. The patient understands and consents to the procedure. Right femoral region prepped and draped in usual sterile fashion. Maximal barrier sterile technique was utilized including caps, mask, sterile gowns, sterile gloves, sterile drape, hand hygiene and skin antiseptic. The  right common femoral artery was localized under ultrasound. Under real-time ultrasound guidance, the vessel was accessed with a 21-gauge micropuncture needle, exchanged over a 018 guidewire for a transitional dilator, through which a 035 guidewire was advanced. Over this, a 5 [place] vascular sheath was placed, through which a 5 [place] C2 catheter was advanced and used to selectively catheterize the superior mesenteric artery for selective arteriography in multiple projections. The C2 was then utilized to selectively catheterize the inferior mesenteric artery for selective arteriography in multiple projections. The C2 was then utilized to selectively catheterize the celiac axis for selective arteriography. The catheter and sheath were removed and hemostasis achieved with the aid of the Exoseal device after confirmatory femoral arteriography. The patient tolerated the procedure well. COMPLICATIONS: None immediate FINDINGS: No evidence of active extravasation, early draining vein, AVM, or other lesion to suggest a site or etiology of the patient's GI bleeding. Replaced right hepatic arterial supply from the superior mesenteric artery, an anatomic variant. Venous phase confirms patency of the IMV and portal venous system. IMPRESSION: 1. Negative three-vessel mesenteric arteriogram. No evidence of active extravasation or other focal lesion to suggest etiology of GI bleed. Electronically Signed   By: Lucrezia Europe M.D.   On: 06/05/2015 13:49   Ir Angiogram Visceral Selective  06/05/2015  CLINICAL DATA:  GI bleed. Tagged red cell scintigraphy showed a Focal area of activity in the right upper abdomen. CTA suggested acute hemorrhage in into the lumen of the splenic flexure of the colon. Descending and sigmoid diverticulosis. EXAM: SELECTIVE VISCERAL ARTERIOGRAPHY; IR ULTRASOUND GUIDANCE VASC ACCESS RIGHT ANESTHESIA/SEDATION: Intravenous Fentanyl and Versed were administered as conscious sedation during continuous  cardiorespiratory monitoring by the radiology RN, with a total moderate sedation time of 40 minutes. MEDICATIONS: Lidocaine 1% subcutaneous CONTRAST:  114mL OMNIPAQUE IOHEXOL 300 MG/ML  SOLN PROCEDURE: The procedure, risks (including but not limited to bleeding, infection, organ damage ), benefits, and alternatives were explained to the patient. Questions regarding the procedure were encouraged and answered. The patient understands and consents to the procedure. Right femoral region prepped and draped in usual sterile fashion. Maximal barrier sterile technique was utilized including caps, mask, sterile gowns, sterile gloves, sterile drape, hand hygiene and skin antiseptic. The right common femoral artery was localized under ultrasound. Under real-time ultrasound guidance, the vessel was accessed with a 21-gauge micropuncture needle, exchanged over a 018 guidewire for a transitional dilator, through which a 035 guidewire was advanced. Over this, a 5 [place] vascular sheath was placed, through which a 5 [place] C2  catheter was advanced and used to selectively catheterize the superior mesenteric artery for selective arteriography in multiple projections. The C2 was then utilized to selectively catheterize the inferior mesenteric artery for selective arteriography in multiple projections. The C2 was then utilized to selectively catheterize the celiac axis for selective arteriography. The catheter and sheath were removed and hemostasis achieved with the aid of the Exoseal device after confirmatory femoral arteriography. The patient tolerated the procedure well. COMPLICATIONS: None immediate FINDINGS: No evidence of active extravasation, early draining vein, AVM, or other lesion to suggest a site or etiology of the patient's GI bleeding. Replaced right hepatic arterial supply from the superior mesenteric artery, an anatomic variant. Venous phase confirms patency of the IMV and portal venous system. IMPRESSION: 1. Negative  three-vessel mesenteric arteriogram. No evidence of active extravasation or other focal lesion to suggest etiology of GI bleed. Electronically Signed   By: Lucrezia Europe M.D.   On: 06/05/2015 13:49   Ir Angiogram Visceral Selective  06/05/2015  CLINICAL DATA:  GI bleed. Tagged red cell scintigraphy showed a Focal area of activity in the right upper abdomen. CTA suggested acute hemorrhage in into the lumen of the splenic flexure of the colon. Descending and sigmoid diverticulosis. EXAM: SELECTIVE VISCERAL ARTERIOGRAPHY; IR ULTRASOUND GUIDANCE VASC ACCESS RIGHT ANESTHESIA/SEDATION: Intravenous Fentanyl and Versed were administered as conscious sedation during continuous cardiorespiratory monitoring by the radiology RN, with a total moderate sedation time of 40 minutes. MEDICATIONS: Lidocaine 1% subcutaneous CONTRAST:  132mL OMNIPAQUE IOHEXOL 300 MG/ML  SOLN PROCEDURE: The procedure, risks (including but not limited to bleeding, infection, organ damage ), benefits, and alternatives were explained to the patient. Questions regarding the procedure were encouraged and answered. The patient understands and consents to the procedure. Right femoral region prepped and draped in usual sterile fashion. Maximal barrier sterile technique was utilized including caps, mask, sterile gowns, sterile gloves, sterile drape, hand hygiene and skin antiseptic. The right common femoral artery was localized under ultrasound. Under real-time ultrasound guidance, the vessel was accessed with a 21-gauge micropuncture needle, exchanged over a 018 guidewire for a transitional dilator, through which a 035 guidewire was advanced. Over this, a 5 [place] vascular sheath was placed, through which a 5 [place] C2 catheter was advanced and used to selectively catheterize the superior mesenteric artery for selective arteriography in multiple projections. The C2 was then utilized to selectively catheterize the inferior mesenteric artery for selective  arteriography in multiple projections. The C2 was then utilized to selectively catheterize the celiac axis for selective arteriography. The catheter and sheath were removed and hemostasis achieved with the aid of the Exoseal device after confirmatory femoral arteriography. The patient tolerated the procedure well. COMPLICATIONS: None immediate FINDINGS: No evidence of active extravasation, early draining vein, AVM, or other lesion to suggest a site or etiology of the patient's GI bleeding. Replaced right hepatic arterial supply from the superior mesenteric artery, an anatomic variant. Venous phase confirms patency of the IMV and portal venous system. IMPRESSION: 1. Negative three-vessel mesenteric arteriogram. No evidence of active extravasation or other focal lesion to suggest etiology of GI bleed. Electronically Signed   By: Lucrezia Europe M.D.   On: 06/05/2015 13:49   Ir US Guide Vasc Access Right  06/05/2015  CLINICAL DATA:  GI bleed. Tagged red cell scintigraphy showed a Focal area of activity in the right upper abdomen. CTA suggested acute hemorrhage in into the lumen of the splenic flexure of the colon. Descending and sigmoid diverticulosis. EXAM: SELECTIVE VISCERAL ARTERIOGRAPHY; IR  ULTRASOUND GUIDANCE VASC ACCESS RIGHT ANESTHESIA/SEDATION: Intravenous Fentanyl and Versed were administered as conscious sedation during continuous cardiorespiratory monitoring by the radiology RN, with a total moderate sedation time of 40 minutes. MEDICATIONS: Lidocaine 1% subcutaneous CONTRAST:  173mL OMNIPAQUE IOHEXOL 300 MG/ML  SOLN PROCEDURE: The procedure, risks (including but not limited to bleeding, infection, organ damage ), benefits, and alternatives were explained to the patient. Questions regarding the procedure were encouraged and answered. The patient understands and consents to the procedure. Right femoral region prepped and draped in usual sterile fashion. Maximal barrier sterile technique was utilized including  caps, mask, sterile gowns, sterile gloves, sterile drape, hand hygiene and skin antiseptic. The right common femoral artery was localized under ultrasound. Under real-time ultrasound guidance, the vessel was accessed with a 21-gauge micropuncture needle, exchanged over a 018 guidewire for a transitional dilator, through which a 035 guidewire was advanced. Over this, a 5 [place] vascular sheath was placed, through which a 5 [place] C2 catheter was advanced and used to selectively catheterize the superior mesenteric artery for selective arteriography in multiple projections. The C2 was then utilized to selectively catheterize the inferior mesenteric artery for selective arteriography in multiple projections. The C2 was then utilized to selectively catheterize the celiac axis for selective arteriography. The catheter and sheath were removed and hemostasis achieved with the aid of the Exoseal device after confirmatory femoral arteriography. The patient tolerated the procedure well. COMPLICATIONS: None immediate FINDINGS: No evidence of active extravasation, early draining vein, AVM, or other lesion to suggest a site or etiology of the patient's GI bleeding. Replaced right hepatic arterial supply from the superior mesenteric artery, an anatomic variant. Venous phase confirms patency of the IMV and portal venous system. IMPRESSION: 1. Negative three-vessel mesenteric arteriogram. No evidence of active extravasation or other focal lesion to suggest etiology of GI bleed. Electronically Signed   By: Lucrezia Europe M.D.   On: 06/05/2015 13:49   Ct Angio Abd/pel W/ And/or W/o  06/05/2015  CLINICAL DATA:  Acute onset of GI bleeding. Bloody bowel movements. Decreased hemoglobin. Initial encounter. EXAM: CTA ABDOMEN AND PELVIS wITHOUT AND WITH CONTRAST TECHNIQUE: Multidetector CT imaging of the abdomen and pelvis was performed using the standard protocol during bolus administration of intravenous contrast. Multiplanar reconstructed  images and MIPs were obtained and reviewed to evaluate the vascular anatomy. CONTRAST:  137mL OMNIPAQUE IOHEXOL 350 MG/ML SOLN COMPARISON:  CT of the abdomen and pelvis from 09/18/2010 FINDINGS: Mild bibasilar atelectasis is noted. A moderate hiatal hernia is noted. A small amount of contrast is noted within the splenic flexure of the colon, compatible with acute hemorrhage into the splenic flexure of the colon. The abdominal aorta is grossly unremarkable in appearance, without calcific atherosclerotic disease. The celiac trunk, superior mesenteric artery, bilateral renal arteries and inferior mesenteric artery remain fully patent. The inferior vena cava is unremarkable in appearance. The liver and spleen are unremarkable in appearance. The patient is status post cholecystectomy, with clips noted at the gallbladder fossa. The pancreas and adrenal glands are unremarkable. A 3 mm stone is noted at the interpole region of the right kidney. A 2 mm stone is noted at the upper pole of the left kidney. The kidneys are otherwise unremarkable. There is no evidence of hydronephrosis. No obstructing ureteral stones are identified. There is Richter herniation of the ascending colon into a moderate to large right posterior flank hernia, without evidence of obstruction. Scattered diverticulosis is noted along the descending and sigmoid colon, without evidence of diverticulitis. No  free fluid is identified. The small bowel is unremarkable in appearance. The stomach is within normal limits. No acute vascular abnormalities are seen. The appendix is normal in caliber and contains air, without evidence of appendicitis. The bladder is mildly distended and grossly unremarkable. Postoperative change is noted along the anterior pelvic wall. The patient is status post hysterectomy. The ovaries are relatively symmetric. No suspicious adnexal masses are seen. No inguinal lymphadenopathy is seen. No acute osseous abnormalities are identified.  There is mild grade 1 anterolisthesis of L4 on L5, reflecting underlying facet disease. Review of the MIP images confirms the above findings. IMPRESSION: 1. Acute hemorrhage noted into the splenic flexure of the colon, corresponding to the patient's GI bleeding. 2. Moderate hiatal hernia noted. 3. Scattered diverticulosis along the descending and sigmoid colon, without evidence of diverticulitis. 4. Richter herniation of the ascending colon into a moderate to large right posterior flank hernia, without evidence of obstruction. 5. Small nonobstructing bilateral renal stones, measuring up to 3 mm in size. 6. Mild bibasilar atelectasis noted. These results were called by telephone at the time of interpretation on 06/05/2015 at 3:16 am to [facility] on [id number], who verbally acknowledged these results. Electronically Signed   By: Garald Balding M.D.   On: 06/05/2015 03:31    Oren Binet, MD  [facility] Pager:[phone]  If 7PM-7AM, please contact night-coverage [email or web address] Password [email or web address] 06/08/2015, 2:36 PM   LOS: 10 days

## 2015-06-08 NOTE — Progress Notes (Signed)
Patient ID: [name], female   DOB: 12/14/1945, 69 y.o.   MRN: [id number] [facility] Surgery Progress Note:   9 Days Post-Op  Subjective: Mental status is clear.  No bleeding per rectum in a couple of days Objective: Vital signs in last 24 hours: Temp:  [97.8 F (36.6 C)-98.3 F (36.8 C)] 98.3 F (36.8 C) (12/17 0600) Pulse Rate:  [79-82] 82 (12/17 0600) Resp:  [16-19] 16 (12/17 0600) BP: (95-118)/(43-61) 99/45 mmHg (12/17 0600) SpO2:  [96 %-98 %] 96 % (12/17 0600)  Intake/Output from previous day: 12/16 0701 - 12/17 0700 In: 1520 [P.O.:720; I.V.:800] Out: 2400 [Urine:2400] Intake/Output this shift: Total I/O In: 300 [P.O.:300] Out: 300 [Urine:300]  Physical Exam: Work of breathing is not labored.  No abdominal pain  Lab Results:  Results for orders placed or performed during the hospital encounter of 05/29/15 (from the past 48 hour(s))  CBC with Differential/Platelet     Status: Abnormal   Collection Time: 06/06/15  7:16 PM  Result Value Ref Range   WBC 6.6 4.0 - 10.5 K/uL   RBC 3.25 (L) 3.87 - 5.11 MIL/uL   Hemoglobin 9.7 (L) 12.0 - 15.0 g/dL   HCT 30.0 (L) 36.0 - 46.0 %   MCV 92.3 78.0 - 100.0 fL   MCH 29.8 26.0 - 34.0 pg   MCHC 32.3 30.0 - 36.0 g/dL   RDW 17.9 (H) 11.5 - 15.5 %   Platelets 233 150 - 400 K/uL   Neutrophils Relative % 66 %   Neutro Abs 4.3 1.7 - 7.7 K/uL   Lymphocytes Relative 26 %   Lymphs Abs 1.7 0.7 - 4.0 K/uL   Monocytes Relative 5 %   Monocytes Absolute 0.4 0.1 - 1.0 K/uL   Eosinophils Relative 2 %   Eosinophils Absolute 0.1 0.0 - 0.7 K/uL   Basophils Relative 1 %   Basophils Absolute 0.0 0.0 - 0.1 K/uL  Basic metabolic panel     Status: Abnormal   Collection Time: 06/07/15  5:10 AM  Result Value Ref Range   Sodium 139 135 - 145 mmol/L   Potassium 3.2 (L) 3.5 - 5.1 mmol/L   Chloride 110 101 - 111 mmol/L   CO2 24 22 - 32 mmol/L   Glucose, Bld 113 (H) 65 - 99 mg/dL   BUN 12 6 - 20 mg/dL   Creatinine, Ser 0.78 0.44 - 1.00  mg/dL   Calcium 7.9 (L) 8.9 - 10.3 mg/dL   GFR calc non Af Amer >60 >60 mL/min   GFR calc Af Amer >60 >60 mL/min    Comment: (NOTE) The eGFR has been calculated using the CKD EPI equation. This calculation has not been validated in all clinical situations. eGFR's persistently <60 mL/min signify possible Chronic Kidney Disease.    Anion gap 5 5 - 15  CBC with Differential/Platelet     Status: Abnormal   Collection Time: 06/07/15  7:55 AM  Result Value Ref Range   WBC 6.6 4.0 - 10.5 K/uL   RBC 3.06 (L) 3.87 - 5.11 MIL/uL   Hemoglobin 9.4 (L) 12.0 - 15.0 g/dL   HCT 28.3 (L) 36.0 - 46.0 %   MCV 92.5 78.0 - 100.0 fL   MCH 30.7 26.0 - 34.0 pg   MCHC 33.2 30.0 - 36.0 g/dL   RDW 18.0 (H) 11.5 - 15.5 %   Platelets 215 150 - 400 K/uL   Neutrophils Relative % 57 %   Neutro Abs 3.7 1.7 - 7.7 K/uL  Lymphocytes Relative 34 %   Lymphs Abs 2.3 0.7 - 4.0 K/uL   Monocytes Relative 6 %   Monocytes Absolute 0.4 0.1 - 1.0 K/uL   Eosinophils Relative 2 %   Eosinophils Absolute 0.2 0.0 - 0.7 K/uL   Basophils Relative 1 %   Basophils Absolute 0.0 0.0 - 0.1 K/uL  CBC     Status: Abnormal   Collection Time: 06/08/15  5:46 AM  Result Value Ref Range   WBC 6.4 4.0 - 10.5 K/uL   RBC 3.02 (L) 3.87 - 5.11 MIL/uL   Hemoglobin 9.3 (L) 12.0 - 15.0 g/dL   HCT 28.6 (L) 36.0 - 46.0 %   MCV 94.7 78.0 - 100.0 fL   MCH 30.8 26.0 - 34.0 pg   MCHC 32.5 30.0 - 36.0 g/dL   RDW 18.4 (H) 11.5 - 15.5 %   Platelets 235 150 - 400 K/uL    Radiology/Results: No results found.  Anti-infectives: Anti-infectives    None      Assessment/Plan: Problem List: Patient Active Problem List   Diagnosis Date Noted  . Lower GI bleed   . Multiple gastric polyps   . Depression   . Anxiety state   . GI bleed 05/29/2015  . Pain, dental 05/29/2015  . Acute blood loss anemia 05/29/2015  . UGIB (upper gastrointestinal bleed) 05/29/2015  . Injection site reaction 09/06/2014  . Well adult exam 08/29/2014  . Hearing  problem of both ears 08/29/2014  . Dysuria 08/29/2014  . Insomnia 08/29/2014  . Internal hemorrhoids 04/29/2014  . Irritable bladder 07/12/2013  . Menopausal state 07/12/2013  . Paronychia 11/30/2012  . Left ankle pain 07/28/2012  . Right shoulder pain 05/11/2012  . Foot pain 05/11/2012  . IBS (irritable bowel syndrome) 08/18/2011  . B12 deficiency 02/05/2010  . EPIGASTRIC DISCOMFORT 02/04/2010  . HAND PAIN 12/03/2009  . NEOPLASM, SKIN, UNCERTAIN BEHAVIOR 75/10/1831  . ANXIETY 02/26/2009  . RHINITIS 09/06/2008  . Asthenia 04/17/2008  . DIARRHEA 04/17/2008  . BLIND LOOP SYNDROME 03/22/2008  . RASH AND OTHER NONSPECIFIC SKIN ERUPTION 01/23/2008  . Adjustment disorder with mixed anxiety and depressed mood 09/05/2007  . Irritable bowel syndrome 09/05/2007  . RECTAL FISSURE 09/05/2007  . OVARIAN CYST 09/05/2007  . [place] DISEASE 09/05/2007  . RECTAL INCONTINENCE 09/05/2007  . DIVERTICULOSIS, COLON 08/31/2005  . GERD 08/09/2000  . HIATAL HERNIA 08/09/2000    Continued observation 9 Days Post-Op    LOS: 10 days   Matt B. Hassell Done, MD, [facility] [phone] beeper [phone]  06/08/2015 10:52 AM

## 2015-06-09 LAB — HEMOGLOBIN AND HEMATOCRIT, BLOOD
HEMATOCRIT: 31 % — AB (ref 36.0–46.0)
HEMOGLOBIN: 9.8 g/dL — AB (ref 12.0–15.0)

## 2015-06-09 MED ORDER — ACETAMINOPHEN 325 MG PO TABS
650.0000 mg | ORAL_TABLET | Freq: Four times a day (QID) | ORAL | Status: DC | PRN
  Administered 2015-06-09: 650 mg via ORAL
  Filled 2015-06-09: qty 2

## 2015-06-09 MED ORDER — POLYETHYLENE GLYCOL 3350 17 G PO PACK
17.0000 g | PACK | Freq: Every day | ORAL | Status: DC
  Administered 2015-06-09: 17 g via ORAL
  Filled 2015-06-09 (×2): qty 1

## 2015-06-09 MED ORDER — PANTOPRAZOLE SODIUM 40 MG PO TBEC
40.0000 mg | DELAYED_RELEASE_TABLET | Freq: Two times a day (BID) | ORAL | Status: DC
  Administered 2015-06-09 – 2015-06-10 (×2): 40 mg via ORAL
  Filled 2015-06-09 (×2): qty 1

## 2015-06-09 NOTE — Progress Notes (Signed)
10 Days Post-Op  Subjective: She looks great, no pain, never really had any.  Tolerating full liquids for 3 days, BM's are still very dark.    Objective: Vital signs in last 24 hours: Temp:  [97.4 F (36.3 C)-98.6 F (37 C)] 97.4 F (36.3 C) (12/18 0512) Pulse Rate:  [76-81] 76 (12/18 0512) Resp:  [18] 18 (12/18 0512) BP: (108-112)/(44-63) 108/55 mmHg (12/18 0512) SpO2:  [98 %-99 %] 98 % (12/18 0512) Last BM Date: 06/08/15 550 Po  Diet: full liquids Urine 700 recorded Afebrile, VSS H/H is stable will recheck this AM. Angiogram 06/05/15  Intake/Output from previous day: 12/17 0701 - 12/18 0700 In: 550 [P.O.:550] Out: 700 [Urine:700] Intake/Output this shift: Total I/O In: 360 [P.O.:360] Out: -   General appearance: alert, cooperative and no distress GI: soft, non-tender; bowel sounds normal; no masses,  no organomegaly  Lab Results:   Recent Labs  06/07/15 0755 06/08/15 0546  WBC 6.6 6.4  HGB 9.4* 9.3*  HCT 28.3* 28.6*  PLT 215 235    BMET  Recent Labs  06/07/15 0510  NA 139  K 3.2*  CL 110  CO2 24  GLUCOSE 113*  BUN 12  CREATININE 0.78  CALCIUM 7.9*   PT/INR No results for input(s): LABPROT, INR in the last 72 hours.   Recent Labs Lab 06/04/15 0141  AST 28  ALT 22  ALKPHOS 39  BILITOT 1.0  PROT 4.7*  ALBUMIN 2.7*     Lipase     Component Value Date/Time   LIPASE 36.0 02/04/2010 1102     Studies/Results: No results found.  Medications: . sodium chloride   Intravenous Once  . citalopram  20 mg Oral Daily  . pantoprazole (PROTONIX) IV  40 mg Intravenous Q12H    Assessment/Plan Lower GI Bleed (CT angio shows extravasation at Splenic flexure) Transfused 5 units PRBC Hx of IBS/diverticulosis/anal fissure Depression Anemia GERD Antibiotics: None DVT: SCD    Plan:  Soft diet, and see how she does if H/H is stable. We can go to PO PPI.  Recheck labs in AM. If she does not drop her hemoglobin, we can avoid surgery.      LOS: 11 days    Deavin Forst 06/09/2015

## 2015-06-09 NOTE — Progress Notes (Signed)
PATIENT DETAILS Name: [name] Age: 69 y.o. Sex: female Date of Birth: [birth year] Admit Date: 05/29/2015 Admitting Physician Edwin Dada, MD [id number] Plotnikov, MD  Subjective: Dark brown BM this am  Assessment/Plan: Principal Problem: Diverticular bleeding: Seems to have resolved with supportive care. On admission had frank hematochezia, was seen by gastroenterology and underwent Colonoscopy on 12/8 shows diverticulosis andEGD negative for bleeding. Since continued to have hematochezia, she also underwent a Tagged RBC scan which was equivocal, however a CTA abdomen 12/14 was suggestive of acute hemorrhage in the splenic flexure area. Mesenteric angiogram on 12/14 was negative. She was then seen by general surgery, and felt that if hematochezia continued that a left-sided colectomy may be needed. However over the past few days, GI bleeding has significantly slowed down and now seems to have resolved. Observe overnight, home tomorrow morning  Active Problems: Acute blood loss anemia: Secondary to above, transfused 5 units of PRBC so far -hemoglobin remains stable.   Multiple gastric polyps: Noted on EGD - path reveals fundic gland polyps   Depression/anxiety: Continue Celexa  Deconditioning: PT evaluation-HHPT recommended  Disposition: Remain inpatient-home tomorrow if clinical improvement continues  Antimicrobial agents  See below  Anti-infectives    None      DVT Prophylaxis: SCD's  Code Status: Full code   Family Communication Son Dr. Boykin Reaper at [phone] 12/17  Procedures: EGD 12/7 Colonoscopy 12/8 Mesentric Angio 12/14  CONSULTS:  GI, general surgery and IR  Time spent 20 minutes-Greater than 50% of this time was spent in counseling, explanation of diagnosis, planning of further management, and coordination of care.  MEDICATIONS: Scheduled Meds: . sodium chloride   Intravenous Once  . citalopram  20 mg Oral  Daily  . pantoprazole (PROTONIX) IV  40 mg Intravenous Q12H  . polyethylene glycol  17 g Oral Daily   Continuous Infusions:   PRN Meds:.acetaminophen, menthol-cetylpyridinium, oxyCODONE, traMADol    PHYSICAL EXAM: Vital signs in last 24 hours: Filed Vitals:   06/08/15 1507 06/08/15 2126 06/09/15 0512 06/09/15 1305  BP: 112/63 108/44 108/55 102/57  Pulse: 80 81 76 78  Temp: 98.6 F (37 C) 97.9 F (36.6 C) 97.4 F (36.3 C) 98 F (36.7 C)  TempSrc: Oral Oral Oral Oral  Resp: 18 18 18 18   Height:      Weight:      SpO2: 98% 99% 98% 99%    Weight change:  Filed Weights   05/30/15 1023 05/31/15 1757 06/02/15 0437  Weight: 72.576 kg (160 lb) 74 kg (163 lb 2.3 oz) 73.8 kg (162 lb 11.2 oz)   Body mass index is 27.91 kg/(m^2).   Gen Exam: Awake and alert with clear speech.   Neck: Supple, No JVD.   Chest: B/L Clear-no rhonchi CVS: S1 S2 Regular Abdomen: soft, BS +, non tender, non distended.  Extremities: no edema, lower extremities warm to touch. Neurologic: Non Focal.   Skin: No Rash.   Wounds: N/A.    Intake/Output from previous day:  Intake/Output Summary (Last 24 hours) at 06/09/15 1401 Last data filed at 06/09/15 0900  Gross per 24 hour  Intake    610 ml  Output    150 ml  Net    460 ml     LAB RESULTS: CBC  Recent Labs Lab 06/05/15 2035 06/06/15 0608 06/06/15 1916 06/07/15 0755 06/08/15 0546 06/09/15 1050  WBC 7.4 7.4 6.6 6.6 6.4  --  HGB 10.4* 9.9* 9.7* 9.4* 9.3* 9.8*  HCT 31.3* 30.3* 30.0* 28.3* 28.6* 31.0*  PLT 215 204 233 215 235  --   MCV 90.5 91.0 92.3 92.5 94.7  --   MCH 30.1 29.7 29.8 30.7 30.8  --   MCHC 33.2 32.7 32.3 33.2 32.5  --   RDW 16.4* 17.3* 17.9* 18.0* 18.4*  --   LYMPHSABS 1.8 1.7 1.7 2.3  --   --   MONOABS 0.6 0.6 0.4 0.4  --   --   EOSABS 0.1 0.1 0.1 0.2  --   --   BASOSABS 0.0 0.0 0.0 0.0  --   --     Chemistries   Recent Labs Lab 06/04/15 0141 06/05/15 0445 06/06/15 0608 06/07/15 0510  NA 139 137 140 139   K 3.9 3.6 3.3* 3.2*  CL 107 106 108 110  CO2 26 23 23 24   GLUCOSE 106* 97 98 113*  BUN 13 24* 20 12  CREATININE 0.78 0.81 0.86 0.78  CALCIUM 8.0* 8.1* 8.2* 7.9*    CBG: No results for input(s): GLUCAP in the last 168 hours.  GFR Estimated Creatinine Clearance: 63.4 mL/min (by C-G formula based on Cr of 0.78).  Coagulation profile  Recent Labs Lab 06/04/15 0141  INR 1.30    Cardiac Enzymes No results for input(s): CKMB, TROPONINI, MYOGLOBIN in the last 168 hours.  Invalid input(s): CK  Invalid input(s): POCBNP No results for input(s): DDIMER in the last 72 hours. No results for input(s): HGBA1C in the last 72 hours. No results for input(s): CHOL, HDL, LDLCALC, TRIG, CHOLHDL, LDLDIRECT in the last 72 hours. No results for input(s): TSH, T4TOTAL, T3FREE, THYROIDAB in the last 72 hours.  Invalid input(s): FREET3 No results for input(s): VITAMINB12, FOLATE, FERRITIN, TIBC, IRON, RETICCTPCT in the last 72 hours. No results for input(s): LIPASE, AMYLASE in the last 72 hours.  Urine Studies No results for input(s): UHGB, CRYS in the last 72 hours.  Invalid input(s): UACOL, UAPR, USPG, UPH, UTP, UGL, UKET, UBIL, UNIT, UROB, ULEU, UEPI, UWBC, URBC, UBAC, CAST, UCOM, BILUA  MICROBIOLOGY: No results found for this or any previous visit (from the past 240 hour(s)).  RADIOLOGY STUDIES/RESULTS: Nm Gi Blood Loss  06/04/2015  CLINICAL DATA:  Possible upper GI bleed. Patient taking anti-inflammatories for the past 3 weeks. EXAM: NUCLEAR MEDICINE GASTROINTESTINAL BLEEDING SCAN TECHNIQUE: Sequential abdominal images were obtained following intravenous administration of Tc-3m labeled red blood cells. RADIOPHARMACEUTICALS:  27.0 MCi Tc-5m in-vitro labeled red cells. COMPARISON:  None. FINDINGS: There is a vague area of activity noted in the right upper quadrant just below the right kidney and lateral to the right iliac vein. I do not see any significant movement. This could be due to  focal inflammation/diverticulitis, polyp, tumor or vascular abnormality such as aneurysm or varices. It is possible it could be a small bleeding focus in the hepatic flexure region of the colon but I think it is unlikely. I do not see any obvious findings for an upper GI bleed. IMPRESSION: Equivocal area of activity noted in the right upper quadrant, in the region of the hepatic flexure of the colon without obvious progression/movement. This is unlikely an active bleeding site and is more likely a false positive due to inflammation, polyp, tumor or vascular abnormality. Electronically Signed   By: Marijo Sanes M.D.   On: 06/04/2015 08:55   Ir Angiogram Visceral Selective  06/05/2015  CLINICAL DATA:  GI bleed. Tagged red cell scintigraphy showed a Focal  area of activity in the right upper abdomen. CTA suggested acute hemorrhage in into the lumen of the splenic flexure of the colon. Descending and sigmoid diverticulosis. EXAM: SELECTIVE VISCERAL ARTERIOGRAPHY; IR ULTRASOUND GUIDANCE VASC ACCESS RIGHT ANESTHESIA/SEDATION: Intravenous Fentanyl and Versed were administered as conscious sedation during continuous cardiorespiratory monitoring by the radiology RN, with a total moderate sedation time of 40 minutes. MEDICATIONS: Lidocaine 1% subcutaneous CONTRAST:  152mL OMNIPAQUE IOHEXOL 300 MG/ML  SOLN PROCEDURE: The procedure, risks (including but not limited to bleeding, infection, organ damage ), benefits, and alternatives were explained to the patient. Questions regarding the procedure were encouraged and answered. The patient understands and consents to the procedure. Right femoral region prepped and draped in usual sterile fashion. Maximal barrier sterile technique was utilized including caps, mask, sterile gowns, sterile gloves, sterile drape, hand hygiene and skin antiseptic. The right common femoral artery was localized under ultrasound. Under real-time ultrasound guidance, the vessel was accessed with a  21-gauge micropuncture needle, exchanged over a 018 guidewire for a transitional dilator, through which a 035 guidewire was advanced. Over this, a 5 [place] vascular sheath was placed, through which a 5 [place] C2 catheter was advanced and used to selectively catheterize the superior mesenteric artery for selective arteriography in multiple projections. The C2 was then utilized to selectively catheterize the inferior mesenteric artery for selective arteriography in multiple projections. The C2 was then utilized to selectively catheterize the celiac axis for selective arteriography. The catheter and sheath were removed and hemostasis achieved with the aid of the Exoseal device after confirmatory femoral arteriography. The patient tolerated the procedure well. COMPLICATIONS: None immediate FINDINGS: No evidence of active extravasation, early draining vein, AVM, or other lesion to suggest a site or etiology of the patient's GI bleeding. Replaced right hepatic arterial supply from the superior mesenteric artery, an anatomic variant. Venous phase confirms patency of the IMV and portal venous system. IMPRESSION: 1. Negative three-vessel mesenteric arteriogram. No evidence of active extravasation or other focal lesion to suggest etiology of GI bleed. Electronically Signed   By: Lucrezia Europe M.D.   On: 06/05/2015 13:49   Ir Angiogram Visceral Selective  06/05/2015  CLINICAL DATA:  GI bleed. Tagged red cell scintigraphy showed a Focal area of activity in the right upper abdomen. CTA suggested acute hemorrhage in into the lumen of the splenic flexure of the colon. Descending and sigmoid diverticulosis. EXAM: SELECTIVE VISCERAL ARTERIOGRAPHY; IR ULTRASOUND GUIDANCE VASC ACCESS RIGHT ANESTHESIA/SEDATION: Intravenous Fentanyl and Versed were administered as conscious sedation during continuous cardiorespiratory monitoring by the radiology RN, with a total moderate sedation time of 40 minutes. MEDICATIONS: Lidocaine 1%  subcutaneous CONTRAST:  175mL OMNIPAQUE IOHEXOL 300 MG/ML  SOLN PROCEDURE: The procedure, risks (including but not limited to bleeding, infection, organ damage ), benefits, and alternatives were explained to the patient. Questions regarding the procedure were encouraged and answered. The patient understands and consents to the procedure. Right femoral region prepped and draped in usual sterile fashion. Maximal barrier sterile technique was utilized including caps, mask, sterile gowns, sterile gloves, sterile drape, hand hygiene and skin antiseptic. The right common femoral artery was localized under ultrasound. Under real-time ultrasound guidance, the vessel was accessed with a 21-gauge micropuncture needle, exchanged over a 018 guidewire for a transitional dilator, through which a 035 guidewire was advanced. Over this, a 5 [place] vascular sheath was placed, through which a 5 [place] C2 catheter was advanced and used to selectively catheterize the superior mesenteric artery for selective arteriography in multiple projections. The  C2 was then utilized to selectively catheterize the inferior mesenteric artery for selective arteriography in multiple projections. The C2 was then utilized to selectively catheterize the celiac axis for selective arteriography. The catheter and sheath were removed and hemostasis achieved with the aid of the Exoseal device after confirmatory femoral arteriography. The patient tolerated the procedure well. COMPLICATIONS: None immediate FINDINGS: No evidence of active extravasation, early draining vein, AVM, or other lesion to suggest a site or etiology of the patient's GI bleeding. Replaced right hepatic arterial supply from the superior mesenteric artery, an anatomic variant. Venous phase confirms patency of the IMV and portal venous system. IMPRESSION: 1. Negative three-vessel mesenteric arteriogram. No evidence of active extravasation or other focal lesion to suggest etiology of GI bleed.  Electronically Signed   By: Lucrezia Europe M.D.   On: 06/05/2015 13:49   Ir Angiogram Visceral Selective  06/05/2015  CLINICAL DATA:  GI bleed. Tagged red cell scintigraphy showed a Focal area of activity in the right upper abdomen. CTA suggested acute hemorrhage in into the lumen of the splenic flexure of the colon. Descending and sigmoid diverticulosis. EXAM: SELECTIVE VISCERAL ARTERIOGRAPHY; IR ULTRASOUND GUIDANCE VASC ACCESS RIGHT ANESTHESIA/SEDATION: Intravenous Fentanyl and Versed were administered as conscious sedation during continuous cardiorespiratory monitoring by the radiology RN, with a total moderate sedation time of 40 minutes. MEDICATIONS: Lidocaine 1% subcutaneous CONTRAST:  133mL OMNIPAQUE IOHEXOL 300 MG/ML  SOLN PROCEDURE: The procedure, risks (including but not limited to bleeding, infection, organ damage ), benefits, and alternatives were explained to the patient. Questions regarding the procedure were encouraged and answered. The patient understands and consents to the procedure. Right femoral region prepped and draped in usual sterile fashion. Maximal barrier sterile technique was utilized including caps, mask, sterile gowns, sterile gloves, sterile drape, hand hygiene and skin antiseptic. The right common femoral artery was localized under ultrasound. Under real-time ultrasound guidance, the vessel was accessed with a 21-gauge micropuncture needle, exchanged over a 018 guidewire for a transitional dilator, through which a 035 guidewire was advanced. Over this, a 5 [place] vascular sheath was placed, through which a 5 [place] C2 catheter was advanced and used to selectively catheterize the superior mesenteric artery for selective arteriography in multiple projections. The C2 was then utilized to selectively catheterize the inferior mesenteric artery for selective arteriography in multiple projections. The C2 was then utilized to selectively catheterize the celiac axis for selective  arteriography. The catheter and sheath were removed and hemostasis achieved with the aid of the Exoseal device after confirmatory femoral arteriography. The patient tolerated the procedure well. COMPLICATIONS: None immediate FINDINGS: No evidence of active extravasation, early draining vein, AVM, or other lesion to suggest a site or etiology of the patient's GI bleeding. Replaced right hepatic arterial supply from the superior mesenteric artery, an anatomic variant. Venous phase confirms patency of the IMV and portal venous system. IMPRESSION: 1. Negative three-vessel mesenteric arteriogram. No evidence of active extravasation or other focal lesion to suggest etiology of GI bleed. Electronically Signed   By: Lucrezia Europe M.D.   On: 06/05/2015 13:49   Ir US Guide Vasc Access Right  06/05/2015  CLINICAL DATA:  GI bleed. Tagged red cell scintigraphy showed a Focal area of activity in the right upper abdomen. CTA suggested acute hemorrhage in into the lumen of the splenic flexure of the colon. Descending and sigmoid diverticulosis. EXAM: SELECTIVE VISCERAL ARTERIOGRAPHY; IR ULTRASOUND GUIDANCE VASC ACCESS RIGHT ANESTHESIA/SEDATION: Intravenous Fentanyl and Versed were administered as conscious sedation during continuous cardiorespiratory monitoring  by the radiology RN, with a total moderate sedation time of 40 minutes. MEDICATIONS: Lidocaine 1% subcutaneous CONTRAST:  124mL OMNIPAQUE IOHEXOL 300 MG/ML  SOLN PROCEDURE: The procedure, risks (including but not limited to bleeding, infection, organ damage ), benefits, and alternatives were explained to the patient. Questions regarding the procedure were encouraged and answered. The patient understands and consents to the procedure. Right femoral region prepped and draped in usual sterile fashion. Maximal barrier sterile technique was utilized including caps, mask, sterile gowns, sterile gloves, sterile drape, hand hygiene and skin antiseptic. The right common femoral  artery was localized under ultrasound. Under real-time ultrasound guidance, the vessel was accessed with a 21-gauge micropuncture needle, exchanged over a 018 guidewire for a transitional dilator, through which a 035 guidewire was advanced. Over this, a 5 [place] vascular sheath was placed, through which a 5 [place] C2 catheter was advanced and used to selectively catheterize the superior mesenteric artery for selective arteriography in multiple projections. The C2 was then utilized to selectively catheterize the inferior mesenteric artery for selective arteriography in multiple projections. The C2 was then utilized to selectively catheterize the celiac axis for selective arteriography. The catheter and sheath were removed and hemostasis achieved with the aid of the Exoseal device after confirmatory femoral arteriography. The patient tolerated the procedure well. COMPLICATIONS: None immediate FINDINGS: No evidence of active extravasation, early draining vein, AVM, or other lesion to suggest a site or etiology of the patient's GI bleeding. Replaced right hepatic arterial supply from the superior mesenteric artery, an anatomic variant. Venous phase confirms patency of the IMV and portal venous system. IMPRESSION: 1. Negative three-vessel mesenteric arteriogram. No evidence of active extravasation or other focal lesion to suggest etiology of GI bleed. Electronically Signed   By: Lucrezia Europe M.D.   On: 06/05/2015 13:49   Ct Angio Abd/pel W/ And/or W/o  06/05/2015  CLINICAL DATA:  Acute onset of GI bleeding. Bloody bowel movements. Decreased hemoglobin. Initial encounter. EXAM: CTA ABDOMEN AND PELVIS wITHOUT AND WITH CONTRAST TECHNIQUE: Multidetector CT imaging of the abdomen and pelvis was performed using the standard protocol during bolus administration of intravenous contrast. Multiplanar reconstructed images and MIPs were obtained and reviewed to evaluate the vascular anatomy. CONTRAST:  139mL OMNIPAQUE IOHEXOL 350  MG/ML SOLN COMPARISON:  CT of the abdomen and pelvis from 09/18/2010 FINDINGS: Mild bibasilar atelectasis is noted. A moderate hiatal hernia is noted. A small amount of contrast is noted within the splenic flexure of the colon, compatible with acute hemorrhage into the splenic flexure of the colon. The abdominal aorta is grossly unremarkable in appearance, without calcific atherosclerotic disease. The celiac trunk, superior mesenteric artery, bilateral renal arteries and inferior mesenteric artery remain fully patent. The inferior vena cava is unremarkable in appearance. The liver and spleen are unremarkable in appearance. The patient is status post cholecystectomy, with clips noted at the gallbladder fossa. The pancreas and adrenal glands are unremarkable. A 3 mm stone is noted at the interpole region of the right kidney. A 2 mm stone is noted at the upper pole of the left kidney. The kidneys are otherwise unremarkable. There is no evidence of hydronephrosis. No obstructing ureteral stones are identified. There is Richter herniation of the ascending colon into a moderate to large right posterior flank hernia, without evidence of obstruction. Scattered diverticulosis is noted along the descending and sigmoid colon, without evidence of diverticulitis. No free fluid is identified. The small bowel is unremarkable in appearance. The stomach is within normal limits. No acute  vascular abnormalities are seen. The appendix is normal in caliber and contains air, without evidence of appendicitis. The bladder is mildly distended and grossly unremarkable. Postoperative change is noted along the anterior pelvic wall. The patient is status post hysterectomy. The ovaries are relatively symmetric. No suspicious adnexal masses are seen. No inguinal lymphadenopathy is seen. No acute osseous abnormalities are identified. There is mild grade 1 anterolisthesis of L4 on L5, reflecting underlying facet disease. Review of the MIP images  confirms the above findings. IMPRESSION: 1. Acute hemorrhage noted into the splenic flexure of the colon, corresponding to the patient's GI bleeding. 2. Moderate hiatal hernia noted. 3. Scattered diverticulosis along the descending and sigmoid colon, without evidence of diverticulitis. 4. Richter herniation of the ascending colon into a moderate to large right posterior flank hernia, without evidence of obstruction. 5. Small nonobstructing bilateral renal stones, measuring up to 3 mm in size. 6. Mild bibasilar atelectasis noted. These results were called by telephone at the time of interpretation on 06/05/2015 at 3:16 am to [facility] on [facility], who verbally acknowledged these results. Electronically Signed   By: Garald Balding M.D.   On: 06/05/2015 03:31    Oren Binet, MD  [facility] Pager:[phone]  If 7PM-7AM, please contact night-coverage [email or web address] Password [email or web address] 06/09/2015, 2:01 PM   LOS: 11 days

## 2015-06-10 ENCOUNTER — Telehealth: Payer: Self-pay | Admitting: *Deleted

## 2015-06-10 LAB — HEMOGLOBIN AND HEMATOCRIT, BLOOD
HCT: 32.4 % — ABNORMAL LOW (ref 36.0–46.0)
Hemoglobin: 10.3 g/dL — ABNORMAL LOW (ref 12.0–15.0)

## 2015-06-10 MED ORDER — FERROUS SULFATE 325 (65 FE) MG PO TABS: 325.0000 mg | ORAL_TABLET | Freq: Two times a day (BID) | ORAL | Status: DC

## 2015-06-10 MED ORDER — POLYETHYLENE GLYCOL 3350 17 G PO PACK: 17.0000 g | PACK | Freq: Every day | ORAL | Status: AC

## 2015-06-10 MED ORDER — FERROUS SULFATE 325 (65 FE) MG PO TABS: 325.0000 mg | ORAL_TABLET | Freq: Two times a day (BID) | ORAL | Status: AC

## 2015-06-10 MED ORDER — TRAMADOL HCL 50 MG PO TABS: 50.0000 mg | ORAL_TABLET | Freq: Four times a day (QID) | ORAL | Status: AC | PRN

## 2015-06-10 NOTE — Progress Notes (Signed)
Occupational Therapy Treatment Patient Details Name: [name] MRN: [id number] DOB: Apr 06, 1946 Today's Date: 06/10/2015    History of present illness pt is a 69 y.o. female with h/o IBS, DDD, diverticulosis, admitted with 1 week of melena.  Work up included units of PRBC's, colonoscopy and endoscopy.   OT comments  Pt. Is performing all UB/LB ADLs at MOD I level of assist and was able to return safe demo of toileting and shower stall transfer.  All education complete, clear for d/c from OT.  Will alert OTR/L to sign off.  D/c home set for later today.   Follow Up Recommendations  No OT follow up;Supervision/Assistance - 24 hour    Equipment Recommendations  None recommended by OT    Recommendations for Other Services      Precautions / Restrictions Precautions Precautions: Fall Restrictions Weight Bearing Restrictions: No       Mobility Bed Mobility Overal bed mobility: Modified Independent Bed Mobility: Rolling;Sidelying to Sit Rolling: Modified independent (Device/Increase time) Sidelying to sit: Modified independent (Device/Increase time)       General bed mobility comments: hob flat, no rails, exits from right side of bed at home  Transfers Overall transfer level: Modified independent   Transfers: Sit to/from Stand Sit to Stand: Modified independent (Device/Increase time) Stand pivot transfers: Modified independent (Device/Increase time)            Balance                                   ADL Overall ADL's : Modified independent;Needs assistance/impaired     Grooming: Set up;Sitting;Standing                   Toilet Transfer: Modified Independent;Regular Toilet   Toileting- Clothing Manipulation and Hygiene: Modified independent;Sitting/lateral lean;Sit to/from stand   Tub/ Shower Transfer: Walk-in shower;Supervision/safety;Ambulation   Functional mobility during ADLs: Supervision/safety;Modified independent General  ADL Comments: pt. moving in room without assist.  able to amb. to/from b.room for toileting and shower stall transfer.  completes ub/lb b/d with set up at sink this session      Vision                     Perception     Praxis      Cognition   Behavior During Therapy: [facility] for tasks assessed/performed Overall Cognitive Status: Within Functional Limits for tasks assessed                       Extremity/Trunk Assessment               Exercises     Shoulder Instructions       General Comments      Pertinent Vitals/ Pain       Pain Assessment: No/denies pain  Home Living                                          Prior Functioning/Environment              Frequency Min 2X/week     Progress Toward Goals  OT Goals(current goals can now be found in the care plan section)  Progress towards OT goals: Goals met/education completed, patient discharged from [place] Discharge plan  remains appropriate    Co-evaluation                 End of Session     Activity Tolerance Patient tolerated treatment well   Patient Left Other (comment) (set up at the sink for b/d)   Nurse Communication          Time: [phone] OT Time Calculation (min): 19 min  Charges: OT General Charges $OT Visit: 1 Procedure OT Treatments $Self Care/Home Management : 8-22 mins  Janice Coffin, COTA/L 06/10/2015, 9:40 AM

## 2015-06-10 NOTE — Telephone Encounter (Signed)
Transition Care Management Follow-up Telephone Call   Date discharged? 06/10/15   How have you been since you were released from the hospital? Pt states she is feeling alright   Do you understand why you were in the hospital? YES   Do you understand the discharge instructions? YES   Where were you discharged to? Home   Items Reviewed:  Medications reviewed: YES  Allergies reviewed: YES  Dietary changes reviewed: NO  Referrals reviewed: No referral needed   Functional Questionnaire:   Activities of Daily Living (ADLs):   She states she are independent in the following: ambulation, bathing and hygiene, feeding, continence, grooming, toileting and dressing States she require assistance with the following: ambulation sometimes   Any transportation issues/concerns?: NO   Any patient concerns? NO   Confirmed importance and date/time of follow-up visits scheduled YES, appt 06/14/15  Provider Appointment booked with Dr. Alain Marion   Confirmed with patient if condition begins to worsen call PCP or go to the ER.  Patient was given the office number and encouraged to call back with question or concerns.  : YES

## 2015-06-10 NOTE — Care Management Important Message (Signed)
Important Message  Patient Details  Name: [name] MRN: [id number] Date of Birth: Aug 17, 1945   Medicare Important Message Given:  Yes    [name] May 06/10/2015, 12:20 PM

## 2015-06-10 NOTE — Care Management Note (Signed)
Case Management Note  Patient Details  Name: [name] MRN: [id number] Date of Birth: [birth year]  Subjective/Objective:      Patient is for dc today home with HHPT with [facility].  [place] notified of patient's discharge.                Action/Plan:   Expected Discharge Date:                  Expected Discharge Plan:  [place]  In-House Referral:     Discharge planning Services  CM Consult  Post Acute Care Choice:  Home Health Choice offered to:  Patient  DME Arranged:    DME Agency:     HH Arranged:  PT [place]:  [facility]  Status of Service:  Completed, signed off  Medicare Important Message Given:  Yes Date Medicare IM Given:    Medicare IM give by:    Date Additional Medicare IM Given:    Additional Medicare Important Message give by:     If discussed at [place] of Stay Meetings, dates discussed:    Additional Comments:  Zenon Mayo, RN 06/10/2015, 11:23 AM

## 2015-06-10 NOTE — Discharge Instructions (Signed)
Follow with Primary MD  Walker Kehr, MD    Please get a complete blood count and chemistry panel checked by your Primary MD at your next visit, and again as instructed by your Primary MD.  Dennis Bast had Gastrointestinal Bleeding: Please ask your Primary MD to check a complete blood count within one week of discharge or at your next visit.  Get Medicines reviewed and adjusted. Please take all your medications with you for your next visit with your Primary MD  Please request your Primary MD to go over all hospital tests and procedure/radiological results at the follow up, please ask your Primary MD to get all Hospital records sent to his/her office.  If you experience worsening of your admission symptoms, develop shortness of breath, life threatening emergency, suicidal or homicidal thoughts you must seek medical attention immediately by calling 911 or calling your MD immediately  if symptoms less severe.  You must read complete instructions/literature along with all the possible adverse reactions/side effects for all the Medicines you take and that have been prescribed to you. Take any new Medicines after you have completely understood and accpet all the possible adverse reactions/side effects.   Do not drive when taking Pain medications or sleeping medications (Benzodaizepines)  Do not take more than prescribed Pain, Sleep and Anxiety Medications  Special Instructions: If you have smoked or chewed Tobacco  in the last 2 yrs please stop smoking, stop any regular Alcohol  and or any Recreational drug use.  Wear Seat belts while driving.  Please note  You were cared for by a hospitalist during your hospital stay. Once you are discharged, your primary care physician will handle any further medical issues. Please note that NO REFILLS for any discharge medications will be authorized once you are discharged, as it is imperative that you return to your primary care physician (or establish a  relationship with a primary care physician if you do not have one) for your aftercare needs so that they can reassess your need for medications and monitor your lab values.

## 2015-06-10 NOTE — Discharge Summary (Signed)
PATIENT DETAILS Name: [name] Age: 69 y.o. Sex: female Date of Birth: 17-Sep-1945 MRN: [id number]. Admitting Physician: Edwin Dada, MD [id number] Plotnikov, MD  Admit Date: 05/29/2015 Discharge date: 06/10/2015  Recommendations for Outpatient Follow-up:  1. Please repeat CBC at next visit   PRIMARY DISCHARGE DIAGNOSIS:  Principal Problem:   GI bleed Active Problems:   Pain, dental   Acute blood loss anemia   UGIB (upper gastrointestinal bleed)   Lower GI bleed   Multiple gastric polyps   Depression   Anxiety state      PAST MEDICAL HISTORY: Past Medical History  Diagnosis Date  . GERD (gastroesophageal reflux disease) 2011    Hiatal hernia, nodular fundus on EGD  . Diverticulosis of colon (without mention of hemorrhage) 2002    left colon/sigmoid.   . Irritable bowel syndrome 2002    chronic diarrhea. biopsies neg for celiac, neg for microscopic/collagenous colitis   . Anal fissure   . Degeneration of intervertebral disc, site unspecified   . Other and unspecified ovarian cyst   . Depression with anxiety   . Bacterial overgrowth syndrome 2002  . Other B-complex deficiencies   . History of colon polyps 08/24/2011    hyperplastic 2013, adenomatous before 2002  . Anemia, unspecified   . Internal hemorrhoids 04/2015    pruritus ani.     DISCHARGE MEDICATIONS: Current Discharge Medication List    START taking these medications   Details  ferrous sulfate 325 (65 FE) MG tablet Take 1 tablet (325 mg total) by mouth 2 (two) times daily with a meal. Qty: 60 tablet, Refills: 0    polyethylene glycol (MIRALAX / GLYCOLAX) packet Take 17 g by mouth daily. Qty: 14 each, Refills: 0    traMADol (ULTRAM) 50 MG tablet Take 1 tablet (50 mg total) by mouth every 6 (six) hours as needed for moderate pain (or HA). Qty: 30 tablet, Refills: 0      CONTINUE these medications which have NOT CHANGED   Details  citalopram (CELEXA) 40 MG tablet Take 1 tablet  (40 mg total) by mouth daily. Qty: 90 tablet, Refills: 3    conjugated estrogens (PREMARIN) vaginal cream Place vaginally every 3 (three) days. pv Qty: 30 g, Refills: 6    pantoprazole (PROTONIX) 40 MG tablet Take 1 tablet (40 mg total) by mouth 2 (two) times daily. Qty: 180 tablet, Refills: 3    Probiotic Product (PHILLIPS COLON HEALTH PO) Take 1 capsule by mouth daily.      STOP taking these medications     loratadine (CLARITIN) 10 MG tablet         ALLERGIES:   Allergies  Allergen Reactions  . Bupropion Hcl     REACTION: head buzzing  . Fluoxetine Hcl     REACTION: anxious  . Latex   . Propoxyphene N-Acetaminophen   . Venlafaxine     REACTION: anxious  . Nickel Rash    BRIEF HPI:  See H&P, Labs, Consult and Test reports for all details in brief, patient was admitted for evaluation of hematochezia.  CONSULTATIONS:   GI and general surgery  PERTINENT RADIOLOGIC STUDIES: Nm Gi Blood Loss  06/04/2015  CLINICAL DATA:  Possible upper GI bleed. Patient taking anti-inflammatories for the past 3 weeks. EXAM: NUCLEAR MEDICINE GASTROINTESTINAL BLEEDING SCAN TECHNIQUE: Sequential abdominal images were obtained following intravenous administration of Tc-49m labeled red blood cells. RADIOPHARMACEUTICALS:  27.0 MCi Tc-1m in-vitro labeled red cells. COMPARISON:  None. FINDINGS: There is a vague area  of activity noted in the right upper quadrant just below the right kidney and lateral to the right iliac vein. I do not see any significant movement. This could be due to focal inflammation/diverticulitis, polyp, tumor or vascular abnormality such as aneurysm or varices. It is possible it could be a small bleeding focus in the hepatic flexure region of the colon but I think it is unlikely. I do not see any obvious findings for an upper GI bleed. IMPRESSION: Equivocal area of activity noted in the right upper quadrant, in the region of the hepatic flexure of the colon without obvious  progression/movement. This is unlikely an active bleeding site and is more likely a false positive due to inflammation, polyp, tumor or vascular abnormality. Electronically Signed   By: Marijo Sanes M.D.   On: 06/04/2015 08:55   Ir Angiogram Visceral Selective  06/05/2015  CLINICAL DATA:  GI bleed. Tagged red cell scintigraphy showed a Focal area of activity in the right upper abdomen. CTA suggested acute hemorrhage in into the lumen of the splenic flexure of the colon. Descending and sigmoid diverticulosis. EXAM: SELECTIVE VISCERAL ARTERIOGRAPHY; IR ULTRASOUND GUIDANCE VASC ACCESS RIGHT ANESTHESIA/SEDATION: Intravenous Fentanyl and Versed were administered as conscious sedation during continuous cardiorespiratory monitoring by the radiology RN, with a total moderate sedation time of 40 minutes. MEDICATIONS: Lidocaine 1% subcutaneous CONTRAST:  16mL OMNIPAQUE IOHEXOL 300 MG/ML  SOLN PROCEDURE: The procedure, risks (including but not limited to bleeding, infection, organ damage ), benefits, and alternatives were explained to the patient. Questions regarding the procedure were encouraged and answered. The patient understands and consents to the procedure. Right femoral region prepped and draped in usual sterile fashion. Maximal barrier sterile technique was utilized including caps, mask, sterile gowns, sterile gloves, sterile drape, hand hygiene and skin antiseptic. The right common femoral artery was localized under ultrasound. Under real-time ultrasound guidance, the vessel was accessed with a 21-gauge micropuncture needle, exchanged over a 018 guidewire for a transitional dilator, through which a 035 guidewire was advanced. Over this, a 5 [place] vascular sheath was placed, through which a 5 [place] C2 catheter was advanced and used to selectively catheterize the superior mesenteric artery for selective arteriography in multiple projections. The C2 was then utilized to selectively catheterize the inferior  mesenteric artery for selective arteriography in multiple projections. The C2 was then utilized to selectively catheterize the celiac axis for selective arteriography. The catheter and sheath were removed and hemostasis achieved with the aid of the Exoseal device after confirmatory femoral arteriography. The patient tolerated the procedure well. COMPLICATIONS: None immediate FINDINGS: No evidence of active extravasation, early draining vein, AVM, or other lesion to suggest a site or etiology of the patient's GI bleeding. Replaced right hepatic arterial supply from the superior mesenteric artery, an anatomic variant. Venous phase confirms patency of the IMV and portal venous system. IMPRESSION: 1. Negative three-vessel mesenteric arteriogram. No evidence of active extravasation or other focal lesion to suggest etiology of GI bleed. Electronically Signed   By: Lucrezia Europe M.D.   On: 06/05/2015 13:49   Ir Angiogram Visceral Selective  06/05/2015  CLINICAL DATA:  GI bleed. Tagged red cell scintigraphy showed a Focal area of activity in the right upper abdomen. CTA suggested acute hemorrhage in into the lumen of the splenic flexure of the colon. Descending and sigmoid diverticulosis. EXAM: SELECTIVE VISCERAL ARTERIOGRAPHY; IR ULTRASOUND GUIDANCE VASC ACCESS RIGHT ANESTHESIA/SEDATION: Intravenous Fentanyl and Versed were administered as conscious sedation during continuous cardiorespiratory monitoring by the radiology RN, with  a total moderate sedation time of 40 minutes. MEDICATIONS: Lidocaine 1% subcutaneous CONTRAST:  144mL OMNIPAQUE IOHEXOL 300 MG/ML  SOLN PROCEDURE: The procedure, risks (including but not limited to bleeding, infection, organ damage ), benefits, and alternatives were explained to the patient. Questions regarding the procedure were encouraged and answered. The patient understands and consents to the procedure. Right femoral region prepped and draped in usual sterile fashion. Maximal barrier sterile  technique was utilized including caps, mask, sterile gowns, sterile gloves, sterile drape, hand hygiene and skin antiseptic. The right common femoral artery was localized under ultrasound. Under real-time ultrasound guidance, the vessel was accessed with a 21-gauge micropuncture needle, exchanged over a 018 guidewire for a transitional dilator, through which a 035 guidewire was advanced. Over this, a 5 [place] vascular sheath was placed, through which a 5 [place] C2 catheter was advanced and used to selectively catheterize the superior mesenteric artery for selective arteriography in multiple projections. The C2 was then utilized to selectively catheterize the inferior mesenteric artery for selective arteriography in multiple projections. The C2 was then utilized to selectively catheterize the celiac axis for selective arteriography. The catheter and sheath were removed and hemostasis achieved with the aid of the Exoseal device after confirmatory femoral arteriography. The patient tolerated the procedure well. COMPLICATIONS: None immediate FINDINGS: No evidence of active extravasation, early draining vein, AVM, or other lesion to suggest a site or etiology of the patient's GI bleeding. Replaced right hepatic arterial supply from the superior mesenteric artery, an anatomic variant. Venous phase confirms patency of the IMV and portal venous system. IMPRESSION: 1. Negative three-vessel mesenteric arteriogram. No evidence of active extravasation or other focal lesion to suggest etiology of GI bleed. Electronically Signed   By: Lucrezia Europe M.D.   On: 06/05/2015 13:49   Ir Angiogram Visceral Selective  06/05/2015  CLINICAL DATA:  GI bleed. Tagged red cell scintigraphy showed a Focal area of activity in the right upper abdomen. CTA suggested acute hemorrhage in into the lumen of the splenic flexure of the colon. Descending and sigmoid diverticulosis. EXAM: SELECTIVE VISCERAL ARTERIOGRAPHY; IR ULTRASOUND GUIDANCE VASC  ACCESS RIGHT ANESTHESIA/SEDATION: Intravenous Fentanyl and Versed were administered as conscious sedation during continuous cardiorespiratory monitoring by the radiology RN, with a total moderate sedation time of 40 minutes. MEDICATIONS: Lidocaine 1% subcutaneous CONTRAST:  134mL OMNIPAQUE IOHEXOL 300 MG/ML  SOLN PROCEDURE: The procedure, risks (including but not limited to bleeding, infection, organ damage ), benefits, and alternatives were explained to the patient. Questions regarding the procedure were encouraged and answered. The patient understands and consents to the procedure. Right femoral region prepped and draped in usual sterile fashion. Maximal barrier sterile technique was utilized including caps, mask, sterile gowns, sterile gloves, sterile drape, hand hygiene and skin antiseptic. The right common femoral artery was localized under ultrasound. Under real-time ultrasound guidance, the vessel was accessed with a 21-gauge micropuncture needle, exchanged over a 018 guidewire for a transitional dilator, through which a 035 guidewire was advanced. Over this, a 5 [place] vascular sheath was placed, through which a 5 [place] C2 catheter was advanced and used to selectively catheterize the superior mesenteric artery for selective arteriography in multiple projections. The C2 was then utilized to selectively catheterize the inferior mesenteric artery for selective arteriography in multiple projections. The C2 was then utilized to selectively catheterize the celiac axis for selective arteriography. The catheter and sheath were removed and hemostasis achieved with the aid of the Exoseal device after confirmatory femoral arteriography. The patient tolerated the procedure  well. COMPLICATIONS: None immediate FINDINGS: No evidence of active extravasation, early draining vein, AVM, or other lesion to suggest a site or etiology of the patient's GI bleeding. Replaced right hepatic arterial supply from the superior  mesenteric artery, an anatomic variant. Venous phase confirms patency of the IMV and portal venous system. IMPRESSION: 1. Negative three-vessel mesenteric arteriogram. No evidence of active extravasation or other focal lesion to suggest etiology of GI bleed. Electronically Signed   By: Lucrezia Europe M.D.   On: 06/05/2015 13:49   Ir US Guide Vasc Access Right  06/05/2015  CLINICAL DATA:  GI bleed. Tagged red cell scintigraphy showed a Focal area of activity in the right upper abdomen. CTA suggested acute hemorrhage in into the lumen of the splenic flexure of the colon. Descending and sigmoid diverticulosis. EXAM: SELECTIVE VISCERAL ARTERIOGRAPHY; IR ULTRASOUND GUIDANCE VASC ACCESS RIGHT ANESTHESIA/SEDATION: Intravenous Fentanyl and Versed were administered as conscious sedation during continuous cardiorespiratory monitoring by the radiology RN, with a total moderate sedation time of 40 minutes. MEDICATIONS: Lidocaine 1% subcutaneous CONTRAST:  183mL OMNIPAQUE IOHEXOL 300 MG/ML  SOLN PROCEDURE: The procedure, risks (including but not limited to bleeding, infection, organ damage ), benefits, and alternatives were explained to the patient. Questions regarding the procedure were encouraged and answered. The patient understands and consents to the procedure. Right femoral region prepped and draped in usual sterile fashion. Maximal barrier sterile technique was utilized including caps, mask, sterile gowns, sterile gloves, sterile drape, hand hygiene and skin antiseptic. The right common femoral artery was localized under ultrasound. Under real-time ultrasound guidance, the vessel was accessed with a 21-gauge micropuncture needle, exchanged over a 018 guidewire for a transitional dilator, through which a 035 guidewire was advanced. Over this, a 5 [place] vascular sheath was placed, through which a 5 [place] C2 catheter was advanced and used to selectively catheterize the superior mesenteric artery for selective arteriography  in multiple projections. The C2 was then utilized to selectively catheterize the inferior mesenteric artery for selective arteriography in multiple projections. The C2 was then utilized to selectively catheterize the celiac axis for selective arteriography. The catheter and sheath were removed and hemostasis achieved with the aid of the Exoseal device after confirmatory femoral arteriography. The patient tolerated the procedure well. COMPLICATIONS: None immediate FINDINGS: No evidence of active extravasation, early draining vein, AVM, or other lesion to suggest a site or etiology of the patient's GI bleeding. Replaced right hepatic arterial supply from the superior mesenteric artery, an anatomic variant. Venous phase confirms patency of the IMV and portal venous system. IMPRESSION: 1. Negative three-vessel mesenteric arteriogram. No evidence of active extravasation or other focal lesion to suggest etiology of GI bleed. Electronically Signed   By: Lucrezia Europe M.D.   On: 06/05/2015 13:49   Ct Angio Abd/pel W/ And/or W/o  06/05/2015  CLINICAL DATA:  Acute onset of GI bleeding. Bloody bowel movements. Decreased hemoglobin. Initial encounter. EXAM: CTA ABDOMEN AND PELVIS wITHOUT AND WITH CONTRAST TECHNIQUE: Multidetector CT imaging of the abdomen and pelvis was performed using the standard protocol during bolus administration of intravenous contrast. Multiplanar reconstructed images and MIPs were obtained and reviewed to evaluate the vascular anatomy. CONTRAST:  142mL OMNIPAQUE IOHEXOL 350 MG/ML SOLN COMPARISON:  CT of the abdomen and pelvis from 09/18/2010 FINDINGS: Mild bibasilar atelectasis is noted. A moderate hiatal hernia is noted. A small amount of contrast is noted within the splenic flexure of the colon, compatible with acute hemorrhage into the splenic flexure of the colon. The abdominal aorta  is grossly unremarkable in appearance, without calcific atherosclerotic disease. The celiac trunk, superior  mesenteric artery, bilateral renal arteries and inferior mesenteric artery remain fully patent. The inferior vena cava is unremarkable in appearance. The liver and spleen are unremarkable in appearance. The patient is status post cholecystectomy, with clips noted at the gallbladder fossa. The pancreas and adrenal glands are unremarkable. A 3 mm stone is noted at the interpole region of the right kidney. A 2 mm stone is noted at the upper pole of the left kidney. The kidneys are otherwise unremarkable. There is no evidence of hydronephrosis. No obstructing ureteral stones are identified. There is Richter herniation of the ascending colon into a moderate to large right posterior flank hernia, without evidence of obstruction. Scattered diverticulosis is noted along the descending and sigmoid colon, without evidence of diverticulitis. No free fluid is identified. The small bowel is unremarkable in appearance. The stomach is within normal limits. No acute vascular abnormalities are seen. The appendix is normal in caliber and contains air, without evidence of appendicitis. The bladder is mildly distended and grossly unremarkable. Postoperative change is noted along the anterior pelvic wall. The patient is status post hysterectomy. The ovaries are relatively symmetric. No suspicious adnexal masses are seen. No inguinal lymphadenopathy is seen. No acute osseous abnormalities are identified. There is mild grade 1 anterolisthesis of L4 on L5, reflecting underlying facet disease. Review of the MIP images confirms the above findings. IMPRESSION: 1. Acute hemorrhage noted into the splenic flexure of the colon, corresponding to the patient's GI bleeding. 2. Moderate hiatal hernia noted. 3. Scattered diverticulosis along the descending and sigmoid colon, without evidence of diverticulitis. 4. Richter herniation of the ascending colon into a moderate to large right posterior flank hernia, without evidence of obstruction. 5. Small  nonobstructing bilateral renal stones, measuring up to 3 mm in size. 6. Mild bibasilar atelectasis noted. These results were called by telephone at the time of interpretation on 06/05/2015 at 3:16 am to [facility] on [id number], who verbally acknowledged these results. Electronically Signed   By: Garald Balding M.D.   On: 06/05/2015 03:31     PERTINENT LAB RESULTS: CBC:  Recent Labs  06/08/15 0546 06/09/15 1050 06/10/15 0705  WBC 6.4  --   --   HGB 9.3* 9.8* 10.3*  HCT 28.6* 31.0* 32.4*  PLT 235  --   --    CMET CMP     Component Value Date/Time   NA 139 06/07/2015 0510   K 3.2* 06/07/2015 0510   CL 110 06/07/2015 0510   CO2 24 06/07/2015 0510   GLUCOSE 113* 06/07/2015 0510   GLUCOSE 93 04/22/2006 1133   BUN 12 06/07/2015 0510   CREATININE 0.78 06/07/2015 0510   CALCIUM 7.9* 06/07/2015 0510   PROT 4.7* 06/04/2015 0141   ALBUMIN 2.7* 06/04/2015 0141   AST 28 06/04/2015 0141   ALT 22 06/04/2015 0141   ALKPHOS 39 06/04/2015 0141   BILITOT 1.0 06/04/2015 0141   GFRNONAA >60 06/07/2015 0510   GFRAA >60 06/07/2015 0510    GFR Estimated Creatinine Clearance: 63.4 mL/min (by C-G formula based on Cr of 0.78). No results for input(s): LIPASE, AMYLASE in the last 72 hours. No results for input(s): CKTOTAL, CKMB, CKMBINDEX, TROPONINI in the last 72 hours. Invalid input(s): POCBNP No results for input(s): DDIMER in the last 72 hours. No results for input(s): HGBA1C in the last 72 hours. No results for input(s): CHOL, HDL, LDLCALC, TRIG, CHOLHDL, LDLDIRECT in the last 72 hours.  No results for input(s): TSH, T4TOTAL, T3FREE, THYROIDAB in the last 72 hours.  Invalid input(s): FREET3 No results for input(s): VITAMINB12, FOLATE, FERRITIN, TIBC, IRON, RETICCTPCT in the last 72 hours. Coags: No results for input(s): INR in the last 72 hours.  Invalid input(s): PT Microbiology: No results found for this or any previous visit (from the past 240 hour(s)).   BRIEF HOSPITAL COURSE:    Diverticular bleeding: Seems to have resolved with supportive care. On admission had frank hematochezia, was seen by gastroenterology and underwent Colonoscopy on 12/8 shows diverticulosis andEGD negative for bleeding. Since continued to have hematochezia, she also underwent a Tagged RBC scan which was equivocal, however a CTA abdomen 12/14 was suggestive of acute hemorrhage in the splenic flexure area. Mesenteric angiogram on 12/14 was negative. She was then seen by general surgery, and felt that if hematochezia continued that a left-sided colectomy may be needed. However over the past few days, GI bleeding has resolved-brown stools today.  Active Problems: Acute blood loss anemia: Secondary to above, transfused 5 units of PRBC so far -hemoglobin remains stable.   Multiple gastric polyps: Noted on EGD - path reveals fundic gland polyps   Depression/anxiety: Continue Celexa  Deconditioning: PT evaluation-HHPT recommended  TODAY-DAY OF DISCHARGE:  Subjective:   Eron Sasala today has no headache,no chest abdominal pain,no new weakness tingling or numbness, feels much better wants to go home today.   Objective:   Blood pressure 106/53, pulse 76, temperature 98.1 F (36.7 C), temperature source Oral, resp. rate 18, height 5\' 4"  (1.626 m), weight 73.8 kg (162 lb 11.2 oz), SpO2 100 %.  Intake/Output Summary (Last 24 hours) at 06/10/15 1023 Last data filed at 06/09/15 1802  Gross per 24 hour  Intake    120 ml  Output     10 ml  Net    110 ml   Filed Weights   05/30/15 1023 05/31/15 1757 06/02/15 0437  Weight: 72.576 kg (160 lb) 74 kg (163 lb 2.3 oz) 73.8 kg (162 lb 11.2 oz)    Exam Awake Alert, Oriented *3, No new F.N deficits, Normal affect [place].AT,PERRAL Supple Neck,No JVD, No cervical lymphadenopathy appriciated.  Symmetrical Chest wall movement, Good air movement bilaterally, CTAB RRR,No Gallops,Rubs or new Murmurs, No Parasternal Heave +ve B.Sounds, Abd Soft, Non tender, No  organomegaly appriciated, No rebound -guarding or rigidity. No Cyanosis, Clubbing or edema, No new Rash or bruise  DISCHARGE CONDITION: Stable  DISPOSITION: Home with home health services  DISCHARGE INSTRUCTIONS:    Activity:  As tolerated with Full fall precautions use walker/cane & assistance as needed  Please get a complete blood count and chemistry panel checked by your Primary MD at your next visit, and again as instructed by your Primary MD.  Dennis Bast had Gastrointestinal Bleeding: Please ask your Primary MD to check a complete blood count within one week of discharge or at your next visit.  Get Medicines reviewed and adjusted: Please take all your medications with you for your next visit with your Primary MD  Please request your Primary MD to go over all hospital tests and procedure/radiological results at the follow up, please ask your Primary MD to get all Hospital records sent to his/her office.  If you experience worsening of your admission symptoms, develop shortness of breath, life threatening emergency, suicidal or homicidal thoughts you must seek medical attention immediately by calling 911 or calling your MD immediately  if symptoms less severe.  You must read complete instructions/literature along with all  the possible adverse reactions/side effects for all the Medicines you take and that have been prescribed to you. Take any new Medicines after you have completely understood and accpet all the possible adverse reactions/side effects.   Do not drive when taking Pain medications.   Do not take more than prescribed Pain, Sleep and Anxiety Medications  Special Instructions: If you have smoked or chewed Tobacco  in the last 2 yrs please stop smoking, stop any regular Alcohol  and or any Recreational drug use.  Wear Seat belts while driving.  Please note  You were cared for by a hospitalist during your hospital stay. Once you are discharged, your primary care physician  will handle any further medical issues. Please note that NO REFILLS for any discharge medications will be authorized once you are discharged, as it is imperative that you return to your primary care physician (or establish a relationship with a primary care physician if you do not have one) for your aftercare needs so that they can reassess your need for medications and monitor your lab values.   Diet recommendation: Heart Healthy diet  Discharge Instructions    AMB Referral to [facility] Care Management    Complete by:  As directed   Reason for consult:  Long hospital stay, for post hospital monitoring  Expected date of contact:  1-3 days (reserved for hospital discharges)  Please assign to community nurse for transition of care calls and assess for home visits. Patient will likely discharge over the weekend.  Has Home Health PT only, with [facility].  Patient lives alone. Please assess for [employer] program. Questions please call:  Natividad Brood, RN BSN [facility] Liaison  [phone] business mobile phone     Call MD for:  redness, tenderness, or signs of infection (pain, swelling, redness, odor or green/yellow discharge around incision site)    Complete by:  As directed      Call MD for:    Complete by:  As directed   Bloody stools     Diet - low sodium heart healthy    Complete by:  As directed      Increase activity slowly    Complete by:  As directed            Follow-up Information    Follow up with [facility].   Why:  HHPT   Contact information:   [address] [place] [address] [phone]       Follow up with [facility].   Why:  RN    Contact information:   [place]       Follow up with Walker Kehr, MD. Schedule an appointment as soon as possible for a visit in 1 week.   Specialty:  Internal Medicine   Why:  Hospital follow up   Contact  information:   [address] [place] [address] [phone]      Total Time spent on discharge equals  45 minutes.  SignedOren Binet 06/10/2015 10:23 AM

## 2015-06-10 NOTE — Progress Notes (Signed)
[name] to be D/C'd to home with home health pt per MD order.  Discussed with the patient and all questions fully answered.  VSS, Skin clean, dry and intact without evidence of skin break down, no evidence of skin tears noted. IV catheter discontinued intact. Site without signs and symptoms of complications. Dressing and pressure applied.  An After Visit Summary was printed and given to the patient. Patient received prescriptions.  D/c education completed with patient including follow up instructions, medication list, d/c activities limitations if indicated, with other d/c instructions as indicated by MD - patient able to verbalize understanding, all questions fully answered.   Patient instructed to return to ED, call 911, or call MD for any changes in condition.   Patient escorted via [place], and D/C home via private auto.  Morley Kos Price 06/10/2015 12:30 PM

## 2015-06-11 ENCOUNTER — Other Ambulatory Visit: Payer: Self-pay | Admitting: *Deleted

## 2015-06-11 NOTE — Patient Outreach (Signed)
[facility]) Care Management  06/11/2015  [name] September 06, 1945 [id number]  Initial transition of care call  Unsuccessful attempt to contact [name] for transition of care call. HIPAA compliant message left. Patient was discharged from the hospital on 12/19.  Plan If unable to reach patient on today,I will attempt contact on 12/21.  Joylene Draft, RN, [place] Management [phone]- Mobile [phone]- Toll Free Main Office

## 2015-06-12 ENCOUNTER — Other Ambulatory Visit: Payer: Self-pay | Admitting: *Deleted

## 2015-06-12 NOTE — Patient Outreach (Signed)
[facility]) Care Management  06/12/2015  SHANICE KO 1945-09-01 [id number]  Successful initial telephone call for transition of care I was able to speak with Mrs.Mumford today, I reviewed the purpose of the call and the transition of care program, she reports that she is doing okay at home, she denies abdominal pain, denies noting any bleeding reports that she is having bowel movement. Patient reports that she is eating without problems.  Mrs.Vela states that she is has not had her prescriptions filled yet but she is going to get them on today, reports that she has the finances to purchase. Patient reports that she lives at home alone and has neighbors around that visit and assist.  Mrs. Chainey has a office visit appointment with Dr.Plotnikov on Friday, December 23, she reports that she has transportation there. Patient's discharge instructions included home health physical therapy, patient asked me why, I discussed with her during her hospital therapy, evaluated her felt she would benefit from home health physical therapy evaluation and her MD at discharge ordered service. Patient reports that she is managing okay at home , but is agreeable for me to follow through on contacting [facility] care listed on her discharge instructions.  Confirmed that patient has my contact information, she denies further concerns at this time, reinforced with patient to contact her PCP if she notices any bleeding or new symptoms. Plan I will send Dr.Plotnikov a letter of involvement I placed a call to Dahlia Client form [facility] to follow up on home health physical therapy order Patient will attend her PCP visit on 12/23 Continue weekly  transition of care calls and home visit scheduled for January 13.   THN CM Care Plan Problem One        Most Recent Value   Care Plan Problem One  Recent discharge from hospital after GI bleed, High Risk for readmission   Role  Documenting the Problem One  Care Management [place] for Problem One  Active   [facility]N Long Term Goal (31-90 days)  Patient will report no hospital readmission in the next 31 days   [facility]N Long Term Goal Start Date  06/12/15   Interventions for Problem One Long Term Goal  Reinforced the importance of taking all medications as prescribed, notifying MD of bleeding, new symptoms or concerns sooner   [facility] CM Short Term Goal #1 (0-30 days)  Patient will report no hospital readmission in the next 30 days   [facility] CM Short Term Goal #1 Start Date  06/12/15   Interventions for Short Term Goal #1  Discussed importance of taking medications as prescribed, notify MD, RN of concerns to arrange an office visit with PCP, reviewed the [facility] transition of care calls, and visit   [facility] CM Short Term Goal #2 (0-30 days)  Patient will attend PCP visit within 7 days of discharge    [facility] CM Short Term Goal #2 Start Date  06/12/15   Interventions for Short Term Goal #2  verified that patient has a PCP office visit scheduled and has transportation to appointment     Joylene Draft, RN, [facility] Management [phone]- Mobile [phone]- [place]

## 2015-06-12 NOTE — Patient Outreach (Signed)
[name] [name]) Care Management  06/12/2015  [name] [birth year] [id number]   Unsuccessful attempt to reach patient for transition of care initial contact, No answer, I have left a HIPAA compliant message. I verified patient telephone number by calling her Dr.Plotnikov office.  Plan Will await patient return call, or try to contact patient later today.  Joylene Draft, RN, [facility] Management [phone]- Mobile [phone]- Toll Free Main Office

## 2015-06-13 ENCOUNTER — Other Ambulatory Visit: Payer: Self-pay | Admitting: *Deleted

## 2015-06-13 ENCOUNTER — Other Ambulatory Visit: Payer: Self-pay

## 2015-06-13 NOTE — Patient Outreach (Signed)
[facility]) Care Management  06/13/2015  [name] [birth year] [id number]  Care Coordination call  Telephone call to Dahlia Client from [place] care to follow up on order at discharge from the hospital for patient to have home health physical therapy. Melissa will follow and contact me  Plan I will update patient with information  Joylene Draft, RN, [facility] Management [phone]- Mobile [phone]- [place]

## 2015-06-14 ENCOUNTER — Encounter: Payer: Self-pay | Admitting: Internal Medicine

## 2015-06-14 ENCOUNTER — Ambulatory Visit (INDEPENDENT_AMBULATORY_CARE_PROVIDER_SITE_OTHER): Payer: Commercial Managed Care - HMO | Admitting: Internal Medicine

## 2015-06-14 VITALS — BP 108/74 | HR 88 | Wt 154.0 lb

## 2015-06-14 DIAGNOSIS — K922 Gastrointestinal hemorrhage, unspecified: Secondary | ICD-10-CM

## 2015-06-14 DIAGNOSIS — D62 Acute posthemorrhagic anemia: Secondary | ICD-10-CM

## 2015-06-14 DIAGNOSIS — E538 Deficiency of other specified B group vitamins: Secondary | ICD-10-CM | POA: Diagnosis not present

## 2015-06-14 NOTE — Progress Notes (Signed)
Subjective:  Patient ID: [name], female    DOB: 17-Nov-1945  Age: 69 y.o. MRN: [id number]  CC: No chief complaint on file.   HPI [name] presents for post-hosp f/u: PATIENT DETAILS Name: [name] Age: 69 y.o. Sex: female Date of Birth: 04-20-1946 MRN: [id number]. Admitting Physician: Edwin Dada, MD [id number] Afifa Truax, MD  Admit Date: 05/29/2015 Discharge date: 06/10/2015  Recommendations for Outpatient Follow-up:  1. Please repeat CBC at next visit   PRIMARY DISCHARGE DIAGNOSIS: Principal Problem:  GI bleed Active Problems:  Pain, dental  Acute blood loss anemia  UGIB (upper gastrointestinal bleed)  Lower GI bleed  Multiple gastric polyps  Depression  Anxiety state    PAST MEDICAL HISTORY: Past Medical History  Diagnosis Date  . GERD (gastroesophageal reflux disease) 2011    Hiatal hernia, nodular fundus on EGD  . Diverticulosis of colon (without mention of hemorrhage) 2002    left colon/sigmoid.   . Irritable bowel syndrome 2002    chronic diarrhea. biopsies neg for celiac, neg for microscopic/collagenous colitis   . Anal fissure   . Degeneration of intervertebral disc, site unspecified   . Other and unspecified ovarian cyst   . Depression with anxiety   . Bacterial overgrowth syndrome 2002  . Other B-complex deficiencies   . History of colon polyps 08/24/2011    hyperplastic 2013, adenomatous before 2002  . Anemia, unspecified   . Internal hemorrhoids 04/2015    pruritus ani.     DISCHARGE MEDICATIONS: Current Discharge Medication List    START taking these medications   Details  ferrous sulfate 325 (65 FE) MG tablet Take 1 tablet (325 mg total) by mouth 2 (two) times daily with a meal. Qty: 60 tablet, Refills: 0    polyethylene glycol (MIRALAX / GLYCOLAX) packet Take 17 g by mouth daily. Qty: 14 each, Refills: 0    traMADol  (ULTRAM) 50 MG tablet Take 1 tablet (50 mg total) by mouth every 6 (six) hours as needed for moderate pain (or HA). Qty: 30 tablet, Refills: 0      CONTINUE these medications which have NOT CHANGED   Details  citalopram (CELEXA) 40 MG tablet Take 1 tablet (40 mg total) by mouth daily. Qty: 90 tablet, Refills: 3    conjugated estrogens (PREMARIN) vaginal cream Place vaginally every 3 (three) days. pv Qty: 30 g, Refills: 6    pantoprazole (PROTONIX) 40 MG tablet Take 1 tablet (40 mg total) by mouth 2 (two) times daily. Qty: 180 tablet, Refills: 3    Probiotic Product (PHILLIPS COLON HEALTH PO) Take 1 capsule by mouth daily.      STOP taking these medications     loratadine (CLARITIN) 10 MG tablet         ALLERGIES:  Allergies  Allergen Reactions  . Bupropion Hcl     REACTION: head buzzing  . Fluoxetine Hcl     REACTION: anxious  . Latex   . Propoxyphene N-Acetaminophen   . Venlafaxine     REACTION: anxious  . Nickel Rash    BRIEF HPI: See H&P, Labs, Consult and Test reports for all details in brief, patient was admitted for evaluation of hematochezia.  CONSULTATIONS:  GI and general surgery  PERTINENT RADIOLOGIC STUDIES:  Imaging Results    Nm Gi Blood Loss  06/04/2015 CLINICAL DATA: Possible upper GI bleed. Patient taking anti-inflammatories for the past 3 weeks. EXAM: NUCLEAR MEDICINE GASTROINTESTINAL BLEEDING SCAN TECHNIQUE: Sequential abdominal images were obtained following  intravenous administration of Tc-63m labeled red blood cells. RADIOPHARMACEUTICALS: 27.0 MCi Tc-70m in-vitro labeled red cells. COMPARISON: None. FINDINGS: There is a vague area of activity noted in the right upper quadrant just below the right kidney and lateral to the right iliac vein. I do not see any significant movement. This could be due to focal inflammation/diverticulitis, polyp, tumor or vascular abnormality such as  aneurysm or varices. It is possible it could be a small bleeding focus in the hepatic flexure region of the colon but I think it is unlikely. I do not see any obvious findings for an upper GI bleed. IMPRESSION: Equivocal area of activity noted in the right upper quadrant, in the region of the hepatic flexure of the colon without obvious progression/movement. This is unlikely an active bleeding site and is more likely a false positive due to inflammation, polyp, tumor or vascular abnormality. Electronically Signed By: Marijo Sanes M.D. On: 06/04/2015 08:55   Ir Angiogram Visceral Selective  06/05/2015 CLINICAL DATA: GI bleed. Tagged red cell scintigraphy showed a Focal area of activity in the right upper abdomen. CTA suggested acute hemorrhage in into the lumen of the splenic flexure of the colon. Descending and sigmoid diverticulosis. EXAM: SELECTIVE VISCERAL ARTERIOGRAPHY; IR ULTRASOUND GUIDANCE VASC ACCESS RIGHT ANESTHESIA/SEDATION: Intravenous Fentanyl and Versed were administered as conscious sedation during continuous cardiorespiratory monitoring by the radiology RN, with a total moderate sedation time of 40 minutes. MEDICATIONS: Lidocaine 1% subcutaneous CONTRAST: 174mL OMNIPAQUE IOHEXOL 300 MG/ML SOLN PROCEDURE: The procedure, risks (including but not limited to bleeding, infection, organ damage ), benefits, and alternatives were explained to the patient. Questions regarding the procedure were encouraged and answered. The patient understands and consents to the procedure. Right femoral region prepped and draped in usual sterile fashion. Maximal barrier sterile technique was utilized including caps, mask, sterile gowns, sterile gloves, sterile drape, hand hygiene and skin antiseptic. The right common femoral artery was localized under ultrasound. Under real-time ultrasound guidance, the vessel was accessed with a 21-gauge micropuncture needle, exchanged over a 018 guidewire for a transitional  dilator, through which a 035 guidewire was advanced. Over this, a 5 [place] vascular sheath was placed, through which a 5 [place] C2 catheter was advanced and used to selectively catheterize the superior mesenteric artery for selective arteriography in multiple projections. The C2 was then utilized to selectively catheterize the inferior mesenteric artery for selective arteriography in multiple projections. The C2 was then utilized to selectively catheterize the celiac axis for selective arteriography. The catheter and sheath were removed and hemostasis achieved with the aid of the Exoseal device after confirmatory femoral arteriography. The patient tolerated the procedure well. COMPLICATIONS: None immediate FINDINGS: No evidence of active extravasation, early draining vein, AVM, or other lesion to suggest a site or etiology of the patient's GI bleeding. Replaced right hepatic arterial supply from the superior mesenteric artery, an anatomic variant. Venous phase confirms patency of the IMV and portal venous system. IMPRESSION: 1. Negative three-vessel mesenteric arteriogram. No evidence of active extravasation or other focal lesion to suggest etiology of GI bleed. Electronically Signed By: Lucrezia Europe M.D. On: 06/05/2015 13:49   Ir Angiogram Visceral Selective  06/05/2015 CLINICAL DATA: GI bleed. Tagged red cell scintigraphy showed a Focal area of activity in the right upper abdomen. CTA suggested acute hemorrhage in into the lumen of the splenic flexure of the colon. Descending and sigmoid diverticulosis. EXAM: SELECTIVE VISCERAL ARTERIOGRAPHY; IR ULTRASOUND GUIDANCE VASC ACCESS RIGHT ANESTHESIA/SEDATION: Intravenous Fentanyl and Versed were administered as conscious sedation during  continuous cardiorespiratory monitoring by the radiology RN, with a total moderate sedation time of 40 minutes. MEDICATIONS: Lidocaine 1% subcutaneous CONTRAST: 169mL OMNIPAQUE IOHEXOL 300 MG/ML SOLN PROCEDURE: The procedure,  risks (including but not limited to bleeding, infection, organ damage ), benefits, and alternatives were explained to the patient. Questions regarding the procedure were encouraged and answered. The patient understands and consents to the procedure. Right femoral region prepped and draped in usual sterile fashion. Maximal barrier sterile technique was utilized including caps, mask, sterile gowns, sterile gloves, sterile drape, hand hygiene and skin antiseptic. The right common femoral artery was localized under ultrasound. Under real-time ultrasound guidance, the vessel was accessed with a 21-gauge micropuncture needle, exchanged over a 018 guidewire for a transitional dilator, through which a 035 guidewire was advanced. Over this, a 5 [place] vascular sheath was placed, through which a 5 [place] C2 catheter was advanced and used to selectively catheterize the superior mesenteric artery for selective arteriography in multiple projections. The C2 was then utilized to selectively catheterize the inferior mesenteric artery for selective arteriography in multiple projections. The C2 was then utilized to selectively catheterize the celiac axis for selective arteriography. The catheter and sheath were removed and hemostasis achieved with the aid of the Exoseal device after confirmatory femoral arteriography. The patient tolerated the procedure well. COMPLICATIONS: None immediate FINDINGS: No evidence of active extravasation, early draining vein, AVM, or other lesion to suggest a site or etiology of the patient's GI bleeding. Replaced right hepatic arterial supply from the superior mesenteric artery, an anatomic variant. Venous phase confirms patency of the IMV and portal venous system. IMPRESSION: 1. Negative three-vessel mesenteric arteriogram. No evidence of active extravasation or other focal lesion to suggest etiology of GI bleed. Electronically Signed By: Lucrezia Europe M.D. On: 06/05/2015 13:49   Ir Angiogram  Visceral Selective  06/05/2015 CLINICAL DATA: GI bleed. Tagged red cell scintigraphy showed a Focal area of activity in the right upper abdomen. CTA suggested acute hemorrhage in into the lumen of the splenic flexure of the colon. Descending and sigmoid diverticulosis. EXAM: SELECTIVE VISCERAL ARTERIOGRAPHY; IR ULTRASOUND GUIDANCE VASC ACCESS RIGHT ANESTHESIA/SEDATION: Intravenous Fentanyl and Versed were administered as conscious sedation during continuous cardiorespiratory monitoring by the radiology RN, with a total moderate sedation time of 40 minutes. MEDICATIONS: Lidocaine 1% subcutaneous CONTRAST: 148mL OMNIPAQUE IOHEXOL 300 MG/ML SOLN PROCEDURE: The procedure, risks (including but not limited to bleeding, infection, organ damage ), benefits, and alternatives were explained to the patient. Questions regarding the procedure were encouraged and answered. The patient understands and consents to the procedure. Right femoral region prepped and draped in usual sterile fashion. Maximal barrier sterile technique was utilized including caps, mask, sterile gowns, sterile gloves, sterile drape, hand hygiene and skin antiseptic. The right common femoral artery was localized under ultrasound. Under real-time ultrasound guidance, the vessel was accessed with a 21-gauge micropuncture needle, exchanged over a 018 guidewire for a transitional dilator, through which a 035 guidewire was advanced. Over this, a 5 [place] vascular sheath was placed, through which a 5 [place] C2 catheter was advanced and used to selectively catheterize the superior mesenteric artery for selective arteriography in multiple projections. The C2 was then utilized to selectively catheterize the inferior mesenteric artery for selective arteriography in multiple projections. The C2 was then utilized to selectively catheterize the celiac axis for selective arteriography. The catheter and sheath were removed and hemostasis achieved with the aid of the  Exoseal device after confirmatory femoral arteriography. The patient tolerated the procedure well. COMPLICATIONS:  None immediate FINDINGS: No evidence of active extravasation, early draining vein, AVM, or other lesion to suggest a site or etiology of the patient's GI bleeding. Replaced right hepatic arterial supply from the superior mesenteric artery, an anatomic variant. Venous phase confirms patency of the IMV and portal venous system. IMPRESSION: 1. Negative three-vessel mesenteric arteriogram. No evidence of active extravasation or other focal lesion to suggest etiology of GI bleed. Electronically Signed By: Lucrezia Europe M.D. On: 06/05/2015 13:49   Ir US Guide Vasc Access Right  06/05/2015 CLINICAL DATA: GI bleed. Tagged red cell scintigraphy showed a Focal area of activity in the right upper abdomen. CTA suggested acute hemorrhage in into the lumen of the splenic flexure of the colon. Descending and sigmoid diverticulosis. EXAM: SELECTIVE VISCERAL ARTERIOGRAPHY; IR ULTRASOUND GUIDANCE VASC ACCESS RIGHT ANESTHESIA/SEDATION: Intravenous Fentanyl and Versed were administered as conscious sedation during continuous cardiorespiratory monitoring by the radiology RN, with a total moderate sedation time of 40 minutes. MEDICATIONS: Lidocaine 1% subcutaneous CONTRAST: 127mL OMNIPAQUE IOHEXOL 300 MG/ML SOLN PROCEDURE: The procedure, risks (including but not limited to bleeding, infection, organ damage ), benefits, and alternatives were explained to the patient. Questions regarding the procedure were encouraged and answered. The patient understands and consents to the procedure. Right femoral region prepped and draped in usual sterile fashion. Maximal barrier sterile technique was utilized including caps, mask, sterile gowns, sterile gloves, sterile drape, hand hygiene and skin antiseptic. The right common femoral artery was localized under ultrasound. Under real-time ultrasound guidance, the vessel was accessed  with a 21-gauge micropuncture needle, exchanged over a 018 guidewire for a transitional dilator, through which a 035 guidewire was advanced. Over this, a 5 [place] vascular sheath was placed, through which a 5 [place] C2 catheter was advanced and used to selectively catheterize the superior mesenteric artery for selective arteriography in multiple projections. The C2 was then utilized to selectively catheterize the inferior mesenteric artery for selective arteriography in multiple projections. The C2 was then utilized to selectively catheterize the celiac axis for selective arteriography. The catheter and sheath were removed and hemostasis achieved with the aid of the Exoseal device after confirmatory femoral arteriography. The patient tolerated the procedure well. COMPLICATIONS: None immediate FINDINGS: No evidence of active extravasation, early draining vein, AVM, or other lesion to suggest a site or etiology of the patient's GI bleeding. Replaced right hepatic arterial supply from the superior mesenteric artery, an anatomic variant. Venous phase confirms patency of the IMV and portal venous system. IMPRESSION: 1. Negative three-vessel mesenteric arteriogram. No evidence of active extravasation or other focal lesion to suggest etiology of GI bleed. Electronically Signed By: Lucrezia Europe M.D. On: 06/05/2015 13:49   Ct Angio Abd/pel W/ And/or W/o  06/05/2015 CLINICAL DATA: Acute onset of GI bleeding. Bloody bowel movements. Decreased hemoglobin. Initial encounter. EXAM: CTA ABDOMEN AND PELVIS wITHOUT AND WITH CONTRAST TECHNIQUE: Multidetector CT imaging of the abdomen and pelvis was performed using the standard protocol during bolus administration of intravenous contrast. Multiplanar reconstructed images and MIPs were obtained and reviewed to evaluate the vascular anatomy. CONTRAST: 146mL OMNIPAQUE IOHEXOL 350 MG/ML SOLN COMPARISON: CT of the abdomen and pelvis from 09/18/2010 FINDINGS: Mild bibasilar  atelectasis is noted. A moderate hiatal hernia is noted. A small amount of contrast is noted within the splenic flexure of the colon, compatible with acute hemorrhage into the splenic flexure of the colon. The abdominal aorta is grossly unremarkable in appearance, without calcific atherosclerotic disease. The celiac trunk, superior mesenteric artery, bilateral renal arteries and inferior  mesenteric artery remain fully patent. The inferior vena cava is unremarkable in appearance. The liver and spleen are unremarkable in appearance. The patient is status post cholecystectomy, with clips noted at the gallbladder fossa. The pancreas and adrenal glands are unremarkable. A 3 mm stone is noted at the interpole region of the right kidney. A 2 mm stone is noted at the upper pole of the left kidney. The kidneys are otherwise unremarkable. There is no evidence of hydronephrosis. No obstructing ureteral stones are identified. There is Richter herniation of the ascending colon into a moderate to large right posterior flank hernia, without evidence of obstruction. Scattered diverticulosis is noted along the descending and sigmoid colon, without evidence of diverticulitis. No free fluid is identified. The small bowel is unremarkable in appearance. The stomach is within normal limits. No acute vascular abnormalities are seen. The appendix is normal in caliber and contains air, without evidence of appendicitis. The bladder is mildly distended and grossly unremarkable. Postoperative change is noted along the anterior pelvic wall. The patient is status post hysterectomy. The ovaries are relatively symmetric. No suspicious adnexal masses are seen. No inguinal lymphadenopathy is seen. No acute osseous abnormalities are identified. There is mild grade 1 anterolisthesis of L4 on L5, reflecting underlying facet disease. Review of the MIP images confirms the above findings. IMPRESSION: 1. Acute hemorrhage noted into the splenic flexure of  the colon, corresponding to the patient's GI bleeding. 2. Moderate hiatal hernia noted. 3. Scattered diverticulosis along the descending and sigmoid colon, without evidence of diverticulitis. 4. Richter herniation of the ascending colon into a moderate to large right posterior flank hernia, without evidence of obstruction. 5. Small nonobstructing bilateral renal stones, measuring up to 3 mm in size. 6. Mild bibasilar atelectasis noted. These results were called by telephone at the time of interpretation on 06/05/2015 at 3:16 am to [facility] on [id number], who verbally acknowledged these results. Electronically Signed By: Garald Balding M.D. On: 06/05/2015 03:31      PERTINENT LAB RESULTS: CBC:  Recent Labs (last 2 labs)      Recent Labs  06/08/15 0546 06/09/15 1050 06/10/15 0705  WBC 6.4 --  --   HGB 9.3* 9.8* 10.3*  HCT 28.6* 31.0* 32.4*  PLT 235 --  --      CMET CMP  Labs (Brief)       Component Value Date/Time   NA 139 06/07/2015 0510   K 3.2* 06/07/2015 0510   CL 110 06/07/2015 0510   CO2 24 06/07/2015 0510   GLUCOSE 113* 06/07/2015 0510   GLUCOSE 93 04/22/2006 1133   BUN 12 06/07/2015 0510   CREATININE 0.78 06/07/2015 0510   CALCIUM 7.9* 06/07/2015 0510   PROT 4.7* 06/04/2015 0141   ALBUMIN 2.7* 06/04/2015 0141   AST 28 06/04/2015 0141   ALT 22 06/04/2015 0141   ALKPHOS 39 06/04/2015 0141   BILITOT 1.0 06/04/2015 0141   GFRNONAA >60 06/07/2015 0510   GFRAA >60 06/07/2015 0510      GFR Estimated Creatinine Clearance: 63.4 mL/min (by C-G formula based on Cr of 0.78).  Recent Labs (last 2 labs)     No results for input(s): LIPASE, AMYLASE in the last 72 hours.    Recent Labs (last 2 labs)     No results for input(s): CKTOTAL, CKMB, CKMBINDEX, TROPONINI in the last 72 hours.    Recent Labs (last 2 labs)     Invalid input(s): POCBNP    Recent Labs (last 2 labs)  No results for input(s): DDIMER in the last 72 hours.    Recent Labs (last 2 labs)     No results for input(s): HGBA1C in the last 72 hours.    Recent Labs (last 2 labs)     No results for input(s): CHOL, HDL, LDLCALC, TRIG, CHOLHDL, LDLDIRECT in the last 72 hours.    Recent Labs (last 2 labs)     No results for input(s): TSH, T4TOTAL, T3FREE, THYROIDAB in the last 72 hours.  Invalid input(s): FREET3    Recent Labs (last 2 labs)     No results for input(s): VITAMINB12, FOLATE, FERRITIN, TIBC, IRON, RETICCTPCT in the last 72 hours.   Coags:  Recent Labs (last 2 labs)     No results for input(s): INR in the last 72 hours.  Invalid input(s): PT   Microbiology: No results found for this or any previous visit (from the past 240 hour(s)).   BRIEF HOSPITAL COURSE:  Diverticular bleeding: Seems to have resolved with supportive care. On admission had frank hematochezia, was seen by gastroenterology and underwent Colonoscopy on 12/8 shows diverticulosis andEGD negative for bleeding. Since continued to have hematochezia, she also underwent a Tagged RBC scan which was equivocal, however a CTA abdomen 12/14 was suggestive of acute hemorrhage in the splenic flexure area. Mesenteric angiogram on 12/14 was negative. She was then seen by general surgery, and felt that if hematochezia continued that a left-sided colectomy may be needed. However over the past few days, GI bleeding has resolved-brown stools today.  Active Problems: Acute blood loss anemia: Secondary to above, transfused 5 units of PRBC so far -hemoglobin remains stable.   Multiple gastric polyps: Noted on EGD - path reveals fundic gland polyps   Depression/anxiety: Continue Celexa  Deconditioning: PT evaluation-HHPT recommended  TODAY-DAY OF DISCHARGE:  Subjective:   Iylah Ghuman today has no headache,no chest abdominal pain,no new weakness tingling or numbness, feels much better wants to go home today.    Objective:   Blood pressure 106/53, pulse 76, temperature 98.1 F (36.7 C), temperature source Oral, resp. rate 18, height 5\' 4"  (1.626 m), weight 73.8 kg (162 lb 11.2 oz), SpO2 100 %.  Intake/Output Summary (Last 24 hours) at 06/10/15 1023 Last data filed at 06/09/15 1802  Gross per 24 hour  Intake  120 ml  Output  10 ml  Net  110 ml   Filed Weights   05/30/15 1023 05/31/15 1757 06/02/15 0437  Weight: 72.576 kg (160 lb) 74 kg (163 lb 2.3 oz) 73.8 kg (162 lb 11.2 oz)    Exam Awake Alert, Oriented *3, No new F.N deficits, Normal affect [place].AT,PERRAL Supple Neck,No JVD, No cervical lymphadenopathy appriciated.  Symmetrical Chest wall movement, Good air movement bilaterally, CTAB RRR,No Gallops,Rubs or new Murmurs, No Parasternal Heave +ve B.Sounds, Abd Soft, Non tender, No organomegaly appriciated, No rebound -guarding or rigidity. No Cyanosis, Clubbing or edema, No new Rash or bruise  DISCHARGE CONDITION: Stable  DISPOSITION: Home with home health services  DISCHARGE INSTRUCTIONS:  Activity:  As tolerated with Full fall precautions use walker/cane & assistance as needed  Please get a complete blood count and chemistry panel checked by your Primary MD at your next visit, and again as instructed by your Primary MD.  Dennis Bast had Gastrointestinal Bleeding: Please ask your Primary MD to check a complete blood count within one week of discharge or at your next visit.  Get Medicines reviewed and adjusted: Please take all your medications with you for your next  visit with your Primary MD  Please request your Primary MD to go over all hospital tests and procedure/radiological results at the follow up, please ask your Primary MD to get all Hospital records sent to his/her office.  If you experience worsening of your admission symptoms, develop shortness of breath, life threatening emergency, suicidal or homicidal thoughts you must seek medical  attention immediately by calling 911 or calling your MD immediately if symptoms less severe.  You must read complete instructions/literature along with all the possible adverse reactions/side effects for all the Medicines you take and that have been prescribed to you. Take any new Medicines after you have completely understood and accpet all the possible adverse reactions/side effects.   Do not drive when taking Pain medications.   Do not take more than prescribed Pain, Sleep and Anxiety Medications  Special Instructions: If you have smoked or chewed Tobacco in the last 2 yrs please stop smoking, stop any regular Alcohol and or any Recreational drug use.  Wear Seat belts while driving.  Please note  You were cared for by a hospitalist during your hospital stay. Once you are discharged, your primary care physician will handle any further medical issues. Please note that NO REFILLS for any discharge medications will be authorized once you are discharged, as it is imperative that you return to your primary care physician (or establish a relationship with a primary care physician if you do not have one) for your aftercare needs so that they can reassess your need for medications and monitor your lab values.   Diet recommendation: Heart Healthy diet  Discharge Instructions    AMB Referral to [facility] Care Management  Complete by: As directed   Reason for consult: Long hospital stay, for post hospital monitoring  Expected date of contact: 1-3 days (reserved for hospital discharges)  Please assign to community nurse for transition of care calls and assess for home visits. Patient will likely discharge over the weekend. Has Home Health PT only, with [facility]. Patient lives alone. Please assess for [employer] Well Dine program. Questions please call:  Natividad Brood, RN BSN [facility] Liaison [phone] business mobile phone     Call MD for:  redness, tenderness, or signs of infection (pain, swelling, redness, odor or green/yellow discharge around incision site)  Complete by: As directed      Call MD for:  Complete by: As directed   Bloody stools     Diet - low sodium heart healthy  Complete by: As directed      Increase activity slowly  Complete by: As directed            Follow-up Information    Follow up with [facility].   Why: HHPT   Contact information:   [address] [place] [address] [phone]       Follow up with [facility].   Why: RN    Contact information:   [place]       Follow up with Walker Kehr, MD. Schedule an appointment as soon as possible for a visit in 1 week.   Specialty: Internal Medicine   Why: Hospital follow up   Contact information:   [address] [place] [address] [phone]      Total Time spent on discharge equals 45 minutes.  SignedOren Binet 06/10/2015 10:23 AM     F/u anemia, GI bleed (diverticula bleed),  depression f/u   Outpatient Prescriptions Prior to Visit  Medication Sig Dispense Refill  . citalopram (CELEXA) 40 MG tablet Take 1 tablet (40 mg total) by mouth daily. 90 tablet 3  . conjugated estrogens (PREMARIN) vaginal cream Place vaginally every 3 (three) days. pv 30 g 6  . ferrous sulfate 325 (65 FE) MG tablet Take 1 tablet (325 mg total) by mouth 2 (two) times daily with a meal. 60 tablet 0  . pantoprazole (PROTONIX) 40 MG tablet Take 1 tablet (40 mg total) by mouth 2 (two) times daily. 180 tablet 3  . polyethylene glycol (MIRALAX / GLYCOLAX) packet Take 17 g by mouth daily. 14 each 0  . Probiotic Product (PHILLIPS COLON HEALTH PO) Take 1 capsule by mouth daily.    . traMADol (ULTRAM) 50 MG tablet Take 1 tablet (50 mg total) by mouth every 6 (six) hours as needed for moderate pain  (or HA). 30 tablet 0   No facility-administered medications prior to visit.    ROS Review of Systems  Constitutional: Positive for fatigue. Negative for chills, activity change, appetite change and unexpected weight change.  HENT: Negative for congestion, mouth sores and sinus pressure.   Eyes: Negative for visual disturbance.  Respiratory: Positive for shortness of breath. Negative for cough, chest tightness and wheezing.   Gastrointestinal: Negative for nausea, vomiting, abdominal pain, diarrhea, blood in stool and abdominal distention.  Genitourinary: Negative for frequency, difficulty urinating and vaginal pain.  Musculoskeletal: Negative for back pain and gait problem.  Skin: Negative for pallor and rash.  Neurological: Positive for dizziness and weakness. Negative for tremors, numbness and headaches.  Psychiatric/Behavioral: Negative for confusion and sleep disturbance.    Objective:  BP 108/74 mmHg  Pulse 88  Wt 154 lb (69.854 kg)  SpO2 94%  BP Readings from Last 3 Encounters:  06/14/15 108/74  06/10/15 106/53  04/22/15 112/72    Wt Readings from Last 3 Encounters:  06/14/15 154 lb (69.854 kg)  06/02/15 162 lb 11.2 oz (73.8 kg)  04/22/15 161 lb (73.029 kg)    Physical Exam  Constitutional: She appears well-developed. No distress.  HENT:  Head: Normocephalic.  Right Ear: External ear normal.  Left Ear: External ear normal.  Nose: Nose normal.  Mouth/Throat: Oropharynx is clear and moist.  Eyes: Conjunctivae are normal. Pupils are equal, round, and reactive to light. Right eye exhibits no discharge. Left eye exhibits no discharge.  Neck: Normal range of motion. Neck supple. No JVD present. No tracheal deviation present. No thyromegaly present.  Cardiovascular: Normal rate, regular rhythm and normal heart sounds.   Pulmonary/Chest: No stridor. No respiratory distress. She has no wheezes.  Abdominal: Soft. Bowel sounds are normal. She exhibits no distension and no  mass. There is no tenderness. There is no rebound and no guarding.  Musculoskeletal: She exhibits no edema or tenderness.  Lymphadenopathy:    She has no cervical adenopathy.  Neurological: She displays normal reflexes. No cranial nerve deficit. She exhibits normal muscle tone. Coordination normal.  Skin: No rash noted. No erythema.  Psychiatric: She has a normal mood and affect. Her behavior is normal. Judgment and thought content normal.    Lab Results  Component Value Date   WBC 6.4 06/08/2015   HGB 10.3* 06/10/2015   HCT 32.4* 06/10/2015   PLT 235 06/08/2015   GLUCOSE 113* 06/07/2015   CHOL 196 08/29/2014   TRIG 263.0* 08/29/2014   HDL 60.70 08/29/2014   LDLDIRECT 109.0 08/29/2014   ALT 22 06/04/2015  AST 28 06/04/2015   NA 139 06/07/2015   K 3.2* 06/07/2015   CL 110 06/07/2015   CREATININE 0.78 06/07/2015   BUN 12 06/07/2015   CO2 24 06/07/2015   TSH 1.63 03/20/2015   INR 1.30 06/04/2015    No results found.  Assessment & Plan:   Diagnoses and all orders for this visit:  B12 deficiency -     CBC with Differential/Platelet; Future -     IBC panel; Future -     Basic metabolic panel; Future  Lower GI bleed -     CBC with Differential/Platelet; Future -     IBC panel; Future -     Basic metabolic panel; Future  Acute blood loss anemia -     CBC with Differential/Platelet; Future -     IBC panel; Future -     Basic metabolic panel; Future  I am having Ms. Sausedo maintain her conjugated estrogens, citalopram, Probiotic Product ([place]), pantoprazole, ferrous sulfate, traMADol, and polyethylene glycol.  No orders of the defined types were placed in this encounter.     Follow-up: Return in about 6 weeks (around 07/26/2015) for a follow-up visit.  Walker Kehr, MD

## 2015-06-14 NOTE — Assessment & Plan Note (Addendum)
12/16 diverticula bleed No relapse

## 2015-06-14 NOTE — Progress Notes (Signed)
Pre visit review using our clinic review tool, if applicable. No additional management support is needed unless otherwise documented below in the visit note. 

## 2015-06-14 NOTE — Assessment & Plan Note (Signed)
Re-start B12 

## 2015-06-14 NOTE — Assessment & Plan Note (Signed)
12/16 diverticula bleed On PO Fe sulfate

## 2015-06-18 ENCOUNTER — Encounter: Payer: Self-pay | Admitting: *Deleted

## 2015-06-19 ENCOUNTER — Other Ambulatory Visit: Payer: Self-pay | Admitting: *Deleted

## 2015-06-19 NOTE — Patient Outreach (Signed)
[facility] Care Management  06/19/2015  KARIMA LEHNHOFF December 09, 1945 [id number]   Transition of care call week 2  Mrs.Dehoog reports that she is doing okay except she complained of having several bowel movements a day, she describes as loose dark brown, no blood seen. She reports that she is taking Miralax daily, and will not take it on tomorrow to see if makes a difference, I encouraged her to call Dr.Plotnikov office if she continues to have a problems.  Patient reports having a good appetite,tolerating foods and liquids without problems, she denies abdominal pain. Patient complains of pain from her tooth , right lower that needs a crown that she has an appointment for already, she reports taking tramadol at least twice daily as needed.  Mrs.Luo voiced that she would be interested in receiving the Well Dine meals delivery. I asked Mrs.Erie home care had contacted her regarding physical therapy, she said they had and she needed to return the call..  Plan Place call to [place]- regarding meals being delivered to patient Continue transition of care, call on January 4  Benedetto Coons [place] Management [phone]- Mobile [phone]- [place] .

## 2015-06-19 NOTE — Patient Outreach (Signed)
[facility]) Care Management  06/19/2015  [name] Feb 05, 1946 [id number]   Care Coordination call  Telephone call to [facility] to arrange Well Dine meals  for [name] to her  recent hospitalization and patient request. Per Representative the meals will be delivered on or around January 5.  Plan I will update [name] on date of delivery of meals.  Joylene Draft, RN, [place] Management [phone]- Mobile [phone]- Toll Free Main Office

## 2015-06-19 NOTE — Patient Outreach (Signed)
[facility]) Care Management  06/19/2015  ANAIJAH SEMAN 24-Dec-1945 [id number]   Telephone call to Mrs.Lavalais to inform her of the  delivery date of [employer] Well Dine meals, January 6. Patient expressed her appreciation. Informed patient per [facility] representative meals will be delivered by [employer].  Joylene Draft, RN, [place] Management [phone]- Mobile [phone]- Toll Free Main Office

## 2015-06-26 ENCOUNTER — Other Ambulatory Visit: Payer: Self-pay | Admitting: *Deleted

## 2015-06-27 ENCOUNTER — Other Ambulatory Visit: Payer: Self-pay | Admitting: *Deleted

## 2015-06-27 NOTE — Patient Outreach (Signed)
'  [facility]) Care Management  06/27/2015  [name] 06/27/1945 [id number]  Transition of care week 3 12/4, late entry Unsuccessful Telephone call to [name] to follow up for transition of care.  Plan  I will try again on 12/5 to contact patient for transition of care telephone visit  Joylene Draft, RN, [place] Management [phone]- Mobile [phone]- [place]

## 2015-06-28 ENCOUNTER — Other Ambulatory Visit: Payer: Self-pay | Admitting: *Deleted

## 2015-06-28 NOTE — Patient Outreach (Signed)
  [facility]) Care Management  06/28/2015  [name] 22-Dec-1945 [id number]  Transition of care week 3 Incoming return call from [name], patient reports that she has had problems with jaw pain related to needing a crown dental work done, she had an appointment with her dentist this week and was started on an antibiotic penicillin. Patient reports a few episodes of diarrhea, and stomach feeling queasy. Discussed with patient to notify MD if diarrhea and nausea become persistent, patient states that she is able to tolerate eating and drinking,patient likes yogurt encouraged her to eat some daily and to try to take medication with food. [name] denies having any bloody stools.  [name] reports that she has gotten the needed supplies in her home in preparation for the snow storm and that she will have neighbors to check on her.  Plan Will meet patient at her home for initial home visit on 1/13 at 2pm  .

## 2015-07-05 ENCOUNTER — Other Ambulatory Visit: Payer: Self-pay | Admitting: *Deleted

## 2015-07-05 NOTE — Patient Outreach (Signed)
[facility]) Care Management   07/05/2015  [name] [birth year] [id number]  [name] is an 70 y.o. female   Transition of care week #4  Subjective: " Patient states she is slowly getting her strength back" Patient discussed improvement in her discomfort at tooth area, she has completed her antibiotics Patient discussed her neck discomfort and her history of migraines, and she plans to follow up with orthopedist as she has in the past, denies migraine   Objective:   Review of Systems  Constitutional: Negative.   HENT: Negative.   Respiratory: Negative.   Cardiovascular: Negative.   Gastrointestinal: Negative for blood in stool.  Genitourinary: Negative.   Musculoskeletal: Negative.   Skin: Negative.   Neurological: Negative.   BP 106/60 mmHg  Pulse 74  Resp 18  SpO2 98%  Physical Exam  Constitutional: She is oriented to person, place, and time. She appears well-developed and well-nourished.  Cardiovascular: Normal rate and normal heart sounds.   Respiratory: Breath sounds normal.  GI: Soft.  Neurological: She is alert and oriented to person, place, and time.  Skin: Skin is warm and dry.  Psychiatric: She has a normal mood and affect. Her behavior is normal. Judgment and thought content normal.    Current Medications:   Current Outpatient Prescriptions  Medication Sig Dispense Refill  . citalopram (CELEXA) 40 MG tablet Take 1 tablet (40 mg total) by mouth daily. 90 tablet 3  . conjugated estrogens (PREMARIN) vaginal cream Place vaginally every 3 (three) days. pv 30 g 6  . ferrous sulfate 325 (65 FE) MG tablet Take 1 tablet (325 mg total) by mouth 2 (two) times daily with a meal. 60 tablet 0  . pantoprazole (PROTONIX) 40 MG tablet Take 1 tablet (40 mg total) by mouth 2 (two) times daily. 180 tablet 3  . polyethylene glycol (MIRALAX / GLYCOLAX) packet Take 17 g by mouth daily. 14 each 0  . Probiotic Product (PHILLIPS COLON HEALTH PO)  Take 1 capsule by mouth daily.    . traMADol (ULTRAM) 50 MG tablet Take 1 tablet (50 mg total) by mouth every 6 (six) hours as needed for moderate pain (or HA). 30 tablet 0   No current facility-administered medications for this visit.    Functional Status:   In your present state of health, do you have any difficulty performing the following activities: 06/19/2015 06/19/2015  Hearing? Y N  Vision? N N  Difficulty concentrating or making decisions? N -  Walking or climbing stairs? N -  Dressing or bathing? N -  Doing errands, shopping? N -  Preparing Food and eating ? N -  Using the Toilet? N -  In the past six months, have you accidently leaked urine? Y -  Do you have problems with loss of bowel control? N -  Managing your Medications? N -  Managing your Finances? N -  Housekeeping or managing your Housekeeping? N -   Fall Risk  06/19/2015 03/20/2015  Falls in the past year? No No   Fall/Depression Screening:    PHQ 2/9 Scores 06/19/2015  PHQ - 2 Score 1    Assessment:    Initial  Home visit in Transition of care    [facility] Admission for Lower Gi Bleed Patient denies having diarrhea and denies blood in stools. She continues to take her medications as prescribed. Patient reports that he appetite is not back to normal yet, but she has been enjoying the [facility] Well Dine meals.  Encouraged patient to try frequent smaller meals, and discussed importance of balance meals and to try nutritional drinks such as boost if her appetite decreases. Patient reports that she is able to do her normal activities around the house but tires easily.  Patient discussed her plans of trying to move closer to her daughter in [place],and to increase socialization.   Plan:  Will continue with final transition of care call on next week and case closure, patient in agreement, goals being met. Patient will notify MD of new symptoms or concerns.  THN CM Care Plan Problem One        Most Recent  Value   Care Plan Problem One  Recent discharge from hospital after GI bleed, High Risk for readmission   Role Documenting the Problem One  Care Management [place] for Problem One  Active   THN Long Term Goal (31-90 days)  Patient will report no hospital readmission in the next 31 days   THN Long Term Goal Start Date  06/12/15   Interventions for Problem One Long Term Goal  Reinforced the importance of taking all medications as prescribed, notifying MD of bleeding, new symptoms or concerns sooner   THN CM Short Term Goal #1 (0-30 days)  Patient will report no hospital readmission in the next 30 days   THN CM Short Term Goal #1 Start Date  06/12/15   Interventions for Short Term Goal #1  Discussed frequent small meals, if decreased appetite, nutritional drink such as boost.   THN CM Short Term Goal #2 (0-30 days)  Patient will attend PCP visit within 7 days of discharge    [facility] CM Short Term Goal #2 Start Date  06/12/15   [facility] CM Short Term Goal #2 Met Date  06/19/15     Joylene Draft, RN, [place] Management [phone]- Mobile [phone]- [place]

## 2015-07-12 ENCOUNTER — Encounter: Payer: Self-pay | Admitting: *Deleted

## 2015-07-12 ENCOUNTER — Other Ambulatory Visit: Payer: Self-pay | Admitting: *Deleted

## 2015-07-12 ENCOUNTER — Encounter: Payer: Commercial Managed Care - HMO | Admitting: Internal Medicine

## 2015-07-12 NOTE — Patient Outreach (Signed)
[facility]) Care Management  07/12/2015  [name] 1945-08-05 [id number]   Final Transition of care call.  Mrs.Coaker reports that she is managing well at home, tolerating her diet, appetite slowly returning. Mrs.Fong declined need for home health physical therapy states she walking well, able to complete her daily task at home, driving without problems.  Mrs.Olwell denies noticing any blood in her stools and understands to notify MD of any concerns  Patient has successfully completed the transition of care program, denies any other care management needs and is in agreement with case closure. Patient has [facility] contact information if needs arise.  Plan Case Closure, will alert Lurline Del, CMA Will send MD and Patient case closure letter  Joylene Draft, RN, [place] Care Management [phone]- Mobile [phone]- [place] Office

## 2015-07-24 ENCOUNTER — Ambulatory Visit: Payer: Commercial Managed Care - HMO | Admitting: Internal Medicine

## 2015-07-26 ENCOUNTER — Ambulatory Visit: Payer: Commercial Managed Care - HMO | Admitting: Internal Medicine

## 2015-07-31 ENCOUNTER — Ambulatory Visit (INDEPENDENT_AMBULATORY_CARE_PROVIDER_SITE_OTHER): Payer: Commercial Managed Care - HMO | Admitting: Internal Medicine

## 2015-07-31 ENCOUNTER — Encounter: Payer: Self-pay | Admitting: Internal Medicine

## 2015-07-31 ENCOUNTER — Other Ambulatory Visit (INDEPENDENT_AMBULATORY_CARE_PROVIDER_SITE_OTHER): Payer: Commercial Managed Care - HMO

## 2015-07-31 VITALS — BP 132/82 | HR 72 | Wt 157.0 lb

## 2015-07-31 DIAGNOSIS — D62 Acute posthemorrhagic anemia: Secondary | ICD-10-CM

## 2015-07-31 DIAGNOSIS — E538 Deficiency of other specified B group vitamins: Secondary | ICD-10-CM

## 2015-07-31 DIAGNOSIS — K922 Gastrointestinal hemorrhage, unspecified: Secondary | ICD-10-CM

## 2015-07-31 DIAGNOSIS — M25511 Pain in right shoulder: Secondary | ICD-10-CM | POA: Diagnosis not present

## 2015-07-31 LAB — CBC WITH DIFFERENTIAL/PLATELET
Basophils Absolute: 0 10*3/uL (ref 0.0–0.1)
Basophils Relative: 0.6 % (ref 0.0–3.0)
EOS PCT: 1.6 % (ref 0.0–5.0)
Eosinophils Absolute: 0.1 10*3/uL (ref 0.0–0.7)
HCT: 40.8 % (ref 36.0–46.0)
Hemoglobin: 13.7 g/dL (ref 12.0–15.0)
LYMPHS ABS: 2.6 10*3/uL (ref 0.7–4.0)
Lymphocytes Relative: 38.7 % (ref 12.0–46.0)
MCHC: 33.6 g/dL (ref 30.0–36.0)
MCV: 91.3 fl (ref 78.0–100.0)
MONOS PCT: 6.1 % (ref 3.0–12.0)
Monocytes Absolute: 0.4 10*3/uL (ref 0.1–1.0)
NEUTROS PCT: 53 % (ref 43.0–77.0)
Neutro Abs: 3.5 10*3/uL (ref 1.4–7.7)
Platelets: 254 10*3/uL (ref 150.0–400.0)
RBC: 4.47 Mil/uL (ref 3.87–5.11)
RDW: 14.5 % (ref 11.5–15.5)
WBC: 6.7 10*3/uL (ref 4.0–10.5)

## 2015-07-31 LAB — BASIC METABOLIC PANEL
BUN: 17 mg/dL (ref 6–23)
CALCIUM: 9.2 mg/dL (ref 8.4–10.5)
CHLORIDE: 105 meq/L (ref 96–112)
CO2: 27 meq/L (ref 19–32)
Creatinine, Ser: 0.87 mg/dL (ref 0.40–1.20)
GFR: 68.09 mL/min (ref >60.00)
GLUCOSE: 85 mg/dL (ref 70–99)
POTASSIUM: 3.9 meq/L (ref 3.5–5.1)
SODIUM: 139 meq/L (ref 135–145)

## 2015-07-31 LAB — IBC PANEL
Iron: 82 ug/dL (ref 42–145)
Saturation Ratios: 23 % (ref 20.0–50.0)
Transferrin: 255 mg/dL (ref 212.0–360.0)

## 2015-07-31 NOTE — Assessment & Plan Note (Signed)
On Vit B12 Labs

## 2015-07-31 NOTE — Assessment & Plan Note (Signed)
ROM exercises Ice Injection is offered

## 2015-07-31 NOTE — Progress Notes (Signed)
Subjective:  Patient ID: [name], female    DOB: [birth year]  Age: 70 y.o. MRN: [id number]  CC: No chief complaint on file.   HPI [name] presents for depression, anemia, Vit B12, GI bleed f/u. Pt is planning to move to [place]. C/o R shoulder pain  Outpatient Prescriptions Prior to Visit  Medication Sig Dispense Refill  . citalopram (CELEXA) 40 MG tablet Take 1 tablet (40 mg total) by mouth daily. 90 tablet 3  . conjugated estrogens (PREMARIN) vaginal cream Place vaginally every 3 (three) days. pv 30 g 6  . ferrous sulfate 325 (65 FE) MG tablet Take 1 tablet (325 mg total) by mouth 2 (two) times daily with a meal. 60 tablet 0  . pantoprazole (PROTONIX) 40 MG tablet Take 1 tablet (40 mg total) by mouth 2 (two) times daily. 180 tablet 3  . polyethylene glycol (MIRALAX / GLYCOLAX) packet Take 17 g by mouth daily. 14 each 0  . Probiotic Product (PHILLIPS COLON HEALTH PO) Take 1 capsule by mouth daily.    . traMADol (ULTRAM) 50 MG tablet Take 1 tablet (50 mg total) by mouth every 6 (six) hours as needed for moderate pain (or HA). 30 tablet 0   No facility-administered medications prior to visit.    ROS Review of Systems  Constitutional: Negative for chills, activity change, appetite change, fatigue and unexpected weight change.  HENT: Negative for congestion, mouth sores and sinus pressure.   Eyes: Negative for visual disturbance.  Respiratory: Negative for cough and chest tightness.   Gastrointestinal: Negative for nausea and abdominal pain.  Genitourinary: Negative for frequency, difficulty urinating and vaginal pain.  Musculoskeletal: Positive for arthralgias. Negative for back pain and gait problem.  Skin: Negative for pallor and rash.  Neurological: Negative for dizziness, tremors, weakness, numbness and headaches.  Psychiatric/Behavioral: Negative for confusion and sleep disturbance.  R shoulder pain  Objective:  BP 132/82 mmHg  Pulse 72  Wt 157 lb (71.215  kg)  SpO2 96%  BP Readings from Last 3 Encounters:  07/31/15 132/82  07/05/15 106/60  06/14/15 108/74    Wt Readings from Last 3 Encounters:  07/31/15 157 lb (71.215 kg)  06/14/15 154 lb (69.854 kg)  06/02/15 162 lb 11.2 oz (73.8 kg)    Physical Exam  Constitutional: She appears well-developed. No distress.  HENT:  Head: Normocephalic.  Right Ear: External ear normal.  Left Ear: External ear normal.  Nose: Nose normal.  Mouth/Throat: Oropharynx is clear and moist.  Eyes: Conjunctivae are normal. Pupils are equal, round, and reactive to light. Right eye exhibits no discharge. Left eye exhibits no discharge.  Neck: Normal range of motion. Neck supple. No JVD present. No tracheal deviation present. No thyromegaly present.  Cardiovascular: Normal rate, regular rhythm and normal heart sounds.   Pulmonary/Chest: No stridor. No respiratory distress. She has no wheezes.  Abdominal: Soft. Bowel sounds are normal. She exhibits no distension and no mass. There is no tenderness. There is no rebound and no guarding.  Musculoskeletal: She exhibits tenderness. She exhibits no edema.  Lymphadenopathy:    She has no cervical adenopathy.  Neurological: She displays normal reflexes. No cranial nerve deficit. She exhibits normal muscle tone. Coordination normal.  Skin: No rash noted. No erythema.  Psychiatric: She has a normal mood and affect. Her behavior is normal. Judgment and thought content normal.  R shoulder AC joint is tender  Lab Results  Component Value Date   WBC 6.4 06/08/2015   HGB  10.3* 06/10/2015   HCT 32.4* 06/10/2015   PLT 235 06/08/2015   GLUCOSE 113* 06/07/2015   CHOL 196 08/29/2014   TRIG 263.0* 08/29/2014   HDL 60.70 08/29/2014   LDLDIRECT 109.0 08/29/2014   ALT 22 06/04/2015   AST 28 06/04/2015   NA 139 06/07/2015   K 3.2* 06/07/2015   CL 110 06/07/2015   CREATININE 0.78 06/07/2015   BUN 12 06/07/2015   CO2 24 06/07/2015   TSH 1.63 03/20/2015   INR 1.30  06/04/2015    No results found.  Assessment & Plan:   There are no diagnoses linked to this encounter. I am having Ms. Considine maintain her conjugated estrogens, citalopram, Probiotic Product ([employer]), pantoprazole, ferrous sulfate, traMADol, polyethylene glycol, Cholecalciferol, and cyanocobalamin.  Meds ordered this encounter  Medications  . Cholecalciferol 1000 units tablet    Sig: Take 2,000 Units by mouth daily.  . cyanocobalamin 1000 MCG tablet    Sig: Take 1,000 mcg by mouth daily.     Follow-up: No Follow-up on file.  Walker Kehr, MD

## 2015-07-31 NOTE — Progress Notes (Signed)
Pre visit review using our clinic review tool, if applicable. No additional management support is needed unless otherwise documented below in the visit note. 

## 2015-07-31 NOTE — Assessment & Plan Note (Signed)
CBC

## 2015-07-31 NOTE — Patient Instructions (Signed)
AC joint arthritis

## 2015-07-31 NOTE — Assessment & Plan Note (Signed)
No relapse CBC 

## 2015-09-19 ENCOUNTER — Telehealth: Payer: Self-pay | Admitting: Internal Medicine

## 2015-09-19 NOTE — Telephone Encounter (Signed)
Pt called in and said that [facility] and needs a [employer] .  They will not let her make an appt until there is a referral put in

## 2015-09-23 NOTE — Telephone Encounter (Signed)
[name] no longer have access to the [employer] portal. Can you take care of this? Thank you.

## 2015-12-31 ENCOUNTER — Ambulatory Visit (INDEPENDENT_AMBULATORY_CARE_PROVIDER_SITE_OTHER): Payer: Commercial Managed Care - HMO | Admitting: Internal Medicine

## 2015-12-31 ENCOUNTER — Ambulatory Visit (INDEPENDENT_AMBULATORY_CARE_PROVIDER_SITE_OTHER)
Admission: RE | Admit: 2015-12-31 | Discharge: 2015-12-31 | Disposition: A | Discharge status: Home / Self Care | Payer: Commercial Managed Care - HMO | Source: Ambulatory Visit | Attending: Internal Medicine | Admitting: Internal Medicine

## 2015-12-31 ENCOUNTER — Encounter: Payer: Self-pay | Admitting: Internal Medicine

## 2015-12-31 ENCOUNTER — Other Ambulatory Visit (INDEPENDENT_AMBULATORY_CARE_PROVIDER_SITE_OTHER): Payer: Commercial Managed Care - HMO

## 2015-12-31 VITALS — BP 100/70 | HR 72 | Wt 158.0 lb

## 2015-12-31 DIAGNOSIS — K449 Diaphragmatic hernia without obstruction or gangrene: Secondary | ICD-10-CM | POA: Diagnosis not present

## 2015-12-31 DIAGNOSIS — R202 Paresthesia of skin: Secondary | ICD-10-CM

## 2015-12-31 DIAGNOSIS — K922 Gastrointestinal hemorrhage, unspecified: Secondary | ICD-10-CM | POA: Diagnosis not present

## 2015-12-31 DIAGNOSIS — R0600 Dyspnea, unspecified: Secondary | ICD-10-CM

## 2015-12-31 DIAGNOSIS — R5383 Other fatigue: Secondary | ICD-10-CM

## 2015-12-31 LAB — URINALYSIS
Hgb urine dipstick: NEGATIVE
Ketones, ur: NEGATIVE
Leukocytes, UA: NEGATIVE
NITRITE: NEGATIVE
PH: 5.5 (ref 5.0–8.0)
TOTAL PROTEIN, URINE-UPE24: NEGATIVE
Urine Glucose: NEGATIVE
Urobilinogen, UA: 0.2 (ref 0.0–1.0)

## 2015-12-31 LAB — CBC WITH DIFFERENTIAL/PLATELET
Basophils Absolute: 0 10*3/uL (ref 0.0–0.1)
Basophils Relative: 0.6 % (ref 0.0–3.0)
EOS PCT: 1.6 % (ref 0.0–5.0)
Eosinophils Absolute: 0.1 10*3/uL (ref 0.0–0.7)
HCT: 37.8 % (ref 36.0–46.0)
Hemoglobin: 12.9 g/dL (ref 12.0–15.0)
LYMPHS ABS: 3 10*3/uL (ref 0.7–4.0)
Lymphocytes Relative: 39.9 % (ref 12.0–46.0)
MCHC: 34.2 g/dL (ref 30.0–36.0)
MCV: 88.6 fl (ref 78.0–100.0)
MONOS PCT: 6.5 % (ref 3.0–12.0)
Monocytes Absolute: 0.5 10*3/uL (ref 0.1–1.0)
NEUTROS PCT: 51.4 % (ref 43.0–77.0)
Neutro Abs: 3.9 10*3/uL (ref 1.4–7.7)
Platelets: 336 10*3/uL (ref 150.0–400.0)
RBC: 4.26 Mil/uL (ref 3.87–5.11)
RDW: 13.1 % (ref 11.5–15.5)
WBC: 7.6 10*3/uL (ref 4.0–10.5)

## 2015-12-31 LAB — VITAMIN B12: VITAMIN B 12: 1081 pg/mL — AB (ref 211–911)

## 2015-12-31 LAB — BASIC METABOLIC PANEL
BUN: 18 mg/dL (ref 6–23)
CALCIUM: 9.1 mg/dL (ref 8.4–10.5)
CO2: 28 meq/L (ref 19–32)
CREATININE: 0.93 mg/dL (ref 0.40–1.20)
Chloride: 105 mEq/L (ref 96–112)
GFR: 62.97 mL/min (ref >60.00)
GLUCOSE: 100 mg/dL — AB (ref 70–99)
Potassium: 4.3 mEq/L (ref 3.5–5.1)
Sodium: 139 mEq/L (ref 135–145)

## 2015-12-31 LAB — TSH: TSH: 1.9 u[IU]/mL (ref 0.35–4.50)

## 2015-12-31 NOTE — Assessment & Plan Note (Signed)
No relapse 

## 2015-12-31 NOTE — Progress Notes (Signed)
Subjective:  Patient ID: [name], female    DOB: 1945/09/08  Age: 70 y.o. MRN: [id number]  CC: No chief complaint on file.   HPI [name] presents for GI bleed and anemia f/u. C/o SOB, fatigue x 1-2 mo off and on. Fallen twice. C/o LBP. [name] has moved to live w/her dtr in [place]  Outpatient Prescriptions Prior to Visit  Medication Sig Dispense Refill  . Cholecalciferol 1000 units tablet Take 2,000 Units by mouth daily.    . citalopram (CELEXA) 40 MG tablet Take 1 tablet (40 mg total) by mouth daily. 90 tablet 3  . conjugated estrogens (PREMARIN) vaginal cream Place vaginally every 3 (three) days. pv 30 g 6  . cyanocobalamin 1000 MCG tablet Take 1,000 mcg by mouth daily.    . pantoprazole (PROTONIX) 40 MG tablet Take 1 tablet (40 mg total) by mouth 2 (two) times daily. 180 tablet 3  . polyethylene glycol (MIRALAX / GLYCOLAX) packet Take 17 g by mouth daily. 14 each 0  . Probiotic Product (PHILLIPS COLON HEALTH PO) Take 1 capsule by mouth daily.    . traMADol (ULTRAM) 50 MG tablet Take 1 tablet (50 mg total) by mouth every 6 (six) hours as needed for moderate pain (or HA). 30 tablet 0  . ferrous sulfate 325 (65 FE) MG tablet Take 1 tablet (325 mg total) by mouth 2 (two) times daily with a meal. (Patient not taking: Reported on 12/31/2015) 60 tablet 0   No facility-administered medications prior to visit.    ROS Review of Systems  Constitutional: Positive for fatigue. Negative for chills, activity change, appetite change and unexpected weight change.  HENT: Negative for congestion, mouth sores and sinus pressure.   Eyes: Negative for visual disturbance.  Respiratory: Positive for shortness of breath. Negative for cough, chest tightness and wheezing.   Gastrointestinal: Negative for nausea, vomiting, abdominal pain, diarrhea and blood in stool.  Genitourinary: Negative for frequency, difficulty urinating and vaginal pain.  Musculoskeletal: Negative for back pain  and gait problem.  Skin: Negative for pallor and rash.  Neurological: Negative for dizziness, tremors, weakness, numbness and headaches.  Psychiatric/Behavioral: Positive for dysphoric mood. Negative for suicidal ideas, confusion and sleep disturbance. The patient is nervous/anxious.     Objective:  BP 100/70 mmHg  Pulse 72  Wt 158 lb (71.668 kg)  SpO2 96%  BP Readings from Last 3 Encounters:  12/31/15 100/70  07/31/15 132/82  07/05/15 106/60    Wt Readings from Last 3 Encounters:  12/31/15 158 lb (71.668 kg)  07/31/15 157 lb (71.215 kg)  06/14/15 154 lb (69.854 kg)    Physical Exam  Constitutional: She appears well-developed. No distress.  HENT:  Head: Normocephalic.  Right Ear: External ear normal.  Left Ear: External ear normal.  Nose: Nose normal.  Mouth/Throat: Oropharynx is clear and moist.  Eyes: Conjunctivae are normal. Pupils are equal, round, and reactive to light. Right eye exhibits no discharge. Left eye exhibits no discharge.  Neck: Normal range of motion. Neck supple. No JVD present. No tracheal deviation present. No thyromegaly present.  Cardiovascular: Normal rate, regular rhythm and normal heart sounds.   Pulmonary/Chest: No stridor. No respiratory distress. She has no wheezes.  Abdominal: Soft. Bowel sounds are normal. She exhibits no distension and no mass. There is no tenderness. There is no rebound and no guarding.  Musculoskeletal: She exhibits no edema or tenderness.  Lymphadenopathy:    She has no cervical adenopathy.  Neurological: She displays  normal reflexes. No cranial nerve deficit. She exhibits normal muscle tone. Coordination normal.  Skin: No rash noted. No erythema.  Psychiatric: She has a normal mood and affect. Her behavior is normal. Judgment and thought content normal.   Procedure: EKG Indication: DOE Impression: NSR. No acute changes.   Lab Results  Component Value Date   WBC 6.7 07/31/2015   HGB 13.7 07/31/2015   HCT 40.8  07/31/2015   PLT 254.0 07/31/2015   GLUCOSE 85 07/31/2015   CHOL 196 08/29/2014   TRIG 263.0* 08/29/2014   HDL 60.70 08/29/2014   LDLDIRECT 109.0 08/29/2014   ALT 22 06/04/2015   AST 28 06/04/2015   NA 139 07/31/2015   K 3.9 07/31/2015   CL 105 07/31/2015   CREATININE 0.87 07/31/2015   BUN 17 07/31/2015   CO2 27 07/31/2015   TSH 1.63 03/20/2015   INR 1.30 06/04/2015    No results found.  Assessment & Plan:   There are no diagnoses linked to this encounter. I am having Donna. Fitz maintain her conjugated estrogens, citalopram, Probiotic Product ([place]), pantoprazole, ferrous sulfate, traMADol, polyethylene glycol, Cholecalciferol, and cyanocobalamin.  No orders of the defined types were placed in this encounter.     Follow-up: No Follow-up on file.  Walker Kehr, MD

## 2015-12-31 NOTE — Assessment & Plan Note (Signed)
?  etiology CXR EKG Labs

## 2015-12-31 NOTE — Assessment & Plan Note (Signed)
Large HH Protonix

## 2015-12-31 NOTE — Assessment & Plan Note (Signed)
?   etiology 

## 2015-12-31 NOTE — Progress Notes (Signed)
Pre visit review using our clinic review tool, if applicable. No additional management support is needed unless otherwise documented below in the visit note. 

## 2016-01-09 ENCOUNTER — Other Ambulatory Visit: Payer: Self-pay | Admitting: Internal Medicine

## 2016-01-10 ENCOUNTER — Other Ambulatory Visit: Payer: Self-pay | Admitting: *Deleted

## 2016-01-10 MED ORDER — CITALOPRAM HYDROBROMIDE 40 MG PO TABS: 40.0000 mg | ORAL_TABLET | Freq: Every day | ORAL | Status: DC

## 2016-01-16 ENCOUNTER — Ambulatory Visit (HOSPITAL_COMMUNITY): Payer: Commercial Managed Care - HMO | Attending: Cardiology

## 2016-01-16 ENCOUNTER — Other Ambulatory Visit (HOSPITAL_COMMUNITY): Payer: Self-pay

## 2016-01-16 DIAGNOSIS — I340 Nonrheumatic mitral (valve) insufficiency: Secondary | ICD-10-CM | POA: Insufficient documentation

## 2016-01-16 DIAGNOSIS — I071 Rheumatic tricuspid insufficiency: Secondary | ICD-10-CM | POA: Diagnosis not present

## 2016-01-16 DIAGNOSIS — R0600 Dyspnea, unspecified: Secondary | ICD-10-CM | POA: Insufficient documentation

## 2016-01-16 DIAGNOSIS — I272 Other secondary pulmonary hypertension: Secondary | ICD-10-CM | POA: Diagnosis not present

## 2016-01-28 ENCOUNTER — Telehealth: Payer: Self-pay | Admitting: Internal Medicine

## 2016-01-28 DIAGNOSIS — I272 Pulmonary hypertension, unspecified: Secondary | ICD-10-CM

## 2016-01-28 NOTE — Telephone Encounter (Signed)
Patient is requesting call back with electrocardiogram results.

## 2016-01-29 NOTE — Telephone Encounter (Signed)
Pt informed- she has no one in particular but would like for it to be in [place]. Please place referral.

## 2016-01-29 NOTE — Telephone Encounter (Signed)
I called pt- no answer. Left detailed mess informing of below and to call us back with her preference for a Cardiology referral.

## 2016-01-29 NOTE — Telephone Encounter (Signed)
Ok Done Thx 

## 2016-01-29 NOTE — Telephone Encounter (Signed)
Did she get a mychart message from me? She has a "borderline elevated" pressure in the pulmonary arteries. I asked her about seeing a cardiologist for consultation and was wondering where to refer: here or in [place].  Thx

## 2016-02-17 ENCOUNTER — Ambulatory Visit: Payer: Commercial Managed Care - HMO | Admitting: Cardiology

## 2016-07-29 ENCOUNTER — Other Ambulatory Visit: Payer: Self-pay | Admitting: Internal Medicine

## 2017-03-17 IMAGING — NM NM GI BLOOD LOSS
2 series · 12 of 12 positions shown · non-contrast
Comparison: None.

CLINICAL DATA: Possible upper GI bleed. Patient taking
anti-inflammatories for the past 3 weeks.

EXAM:
NUCLEAR MEDICINE GASTROINTESTINAL BLEEDING SCAN
TECHNIQUE: Sequential abdominal images were obtained following intravenous
administration of 0c-MMm labeled red blood cells.
RADIOPHARMACEUTICALS:  27.0 MCi 0c-MMm in-vitro labeled red cells.

[gi gi bleed · 5.01mm/px · 6 of 60 frames shown (1 of 2)]
[frame 6/60]
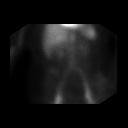
[frame 16/60]
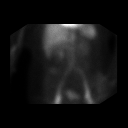
[frame 26/60]
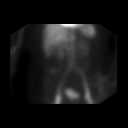
[frame 36/60]
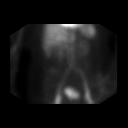
[frame 46/60]
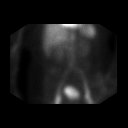
[frame 56/60]
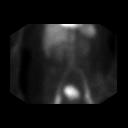

[gi gi bleed · 5.01mm/px · 6 of 60 frames shown (2 of 2)]
[frame 6/60]
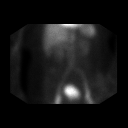
[frame 16/60]
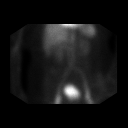
[frame 26/60]
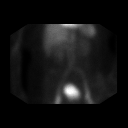
[frame 36/60]
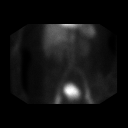
[frame 46/60]
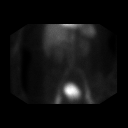
[frame 56/60]
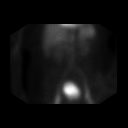

[12 of 12 positions shown; findings below may reference images not displayed]

FINDINGS: There is a vague area of activity noted in the right upper quadrant
just below the right kidney and lateral to the right iliac vein. I
do not see any significant movement. This could be due to focal
inflammation/diverticulitis, polyp, tumor or vascular abnormality
such as aneurysm or varices. It is possible it could be a small
bleeding focus in the hepatic flexure region of the colon but I
think it is unlikely. I do not see any obvious findings for an upper
GI bleed.
IMPRESSION: Equivocal area of activity noted in the right upper quadrant, in the
region of the hepatic flexure of the colon without obvious
progression/movement. This is unlikely an active bleeding site and
is more likely a false positive due to inflammation, polyp, tumor or
vascular abnormality.

## 2017-03-18 IMAGING — CT CT CTA ABD/PEL W/CM AND/OR W/O CM
2 of 6 series · 15 of 46 positions shown, 17 images · IV contrast (OMNI 350)
Comparison: CT of the abdomen and pelvis from 09/18/2010

CLINICAL DATA: Acute onset of GI bleeding. Bloody bowel movements.
Decreased hemoglobin. Initial encounter.

EXAM:
CTA ABDOMEN AND PELVIS wITHOUT AND WITH CONTRAST
TECHNIQUE: Multidetector CT imaging of the abdomen and pelvis was performed
using the standard protocol during bolus administration of
intravenous contrast. Multiplanar reconstructed images and MIPs were
obtained and reviewed to evaluate the vascular anatomy.
CONTRAST:  100mL OMNIPAQUE IOHEXOL 350 MG/ML SOLN

[Series 4: dissection 2mm · axial · 0.71mm/px · z∈[-454,-50]mm · 12 of 222 slices shown, 14 images]
[im 10/222  soft-tissue]
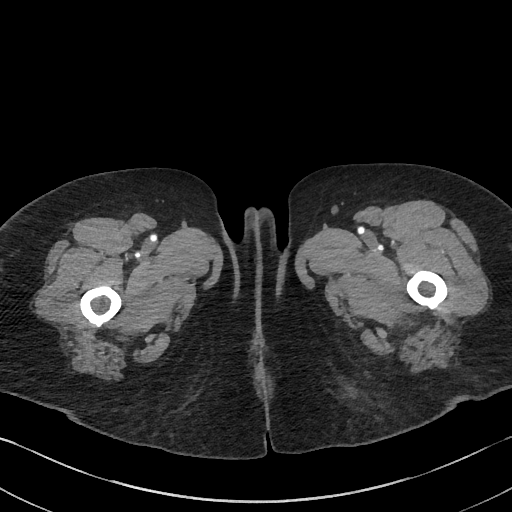
[im 10/222  bone]
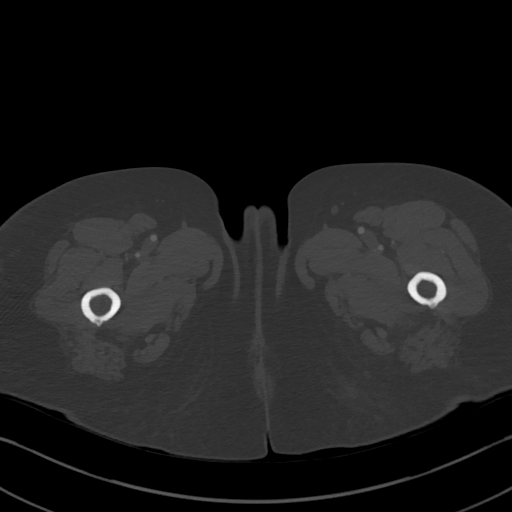
[im 28/222  soft-tissue]
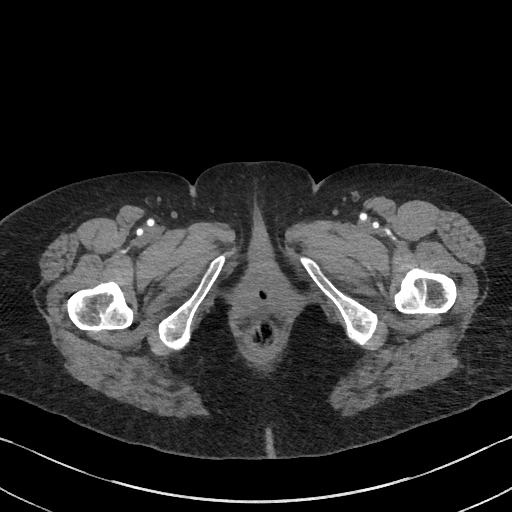
[im 47/222  soft-tissue]
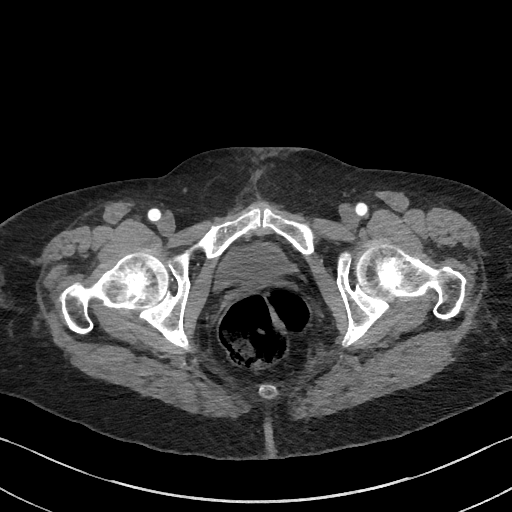
[im 65/222  soft-tissue]
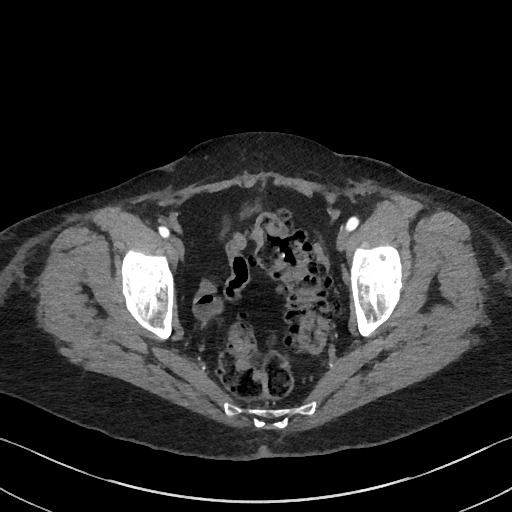
[im 83/222  soft-tissue]
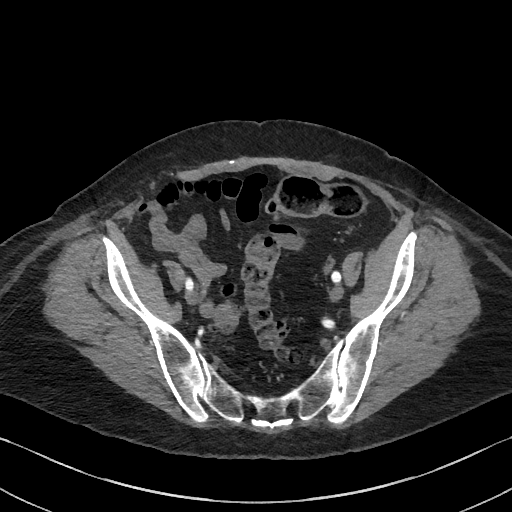
[im 102/222  soft-tissue]
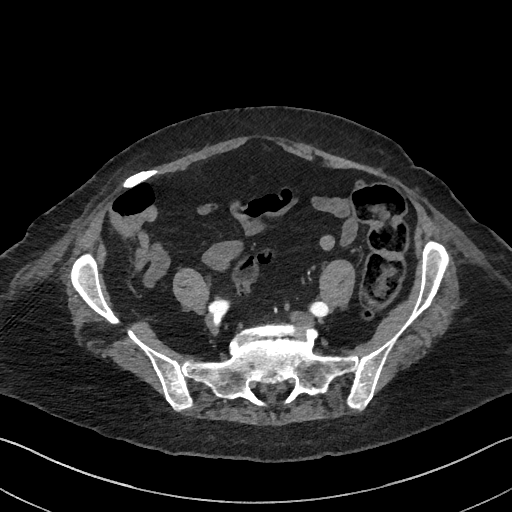
[im 120/222  soft-tissue]
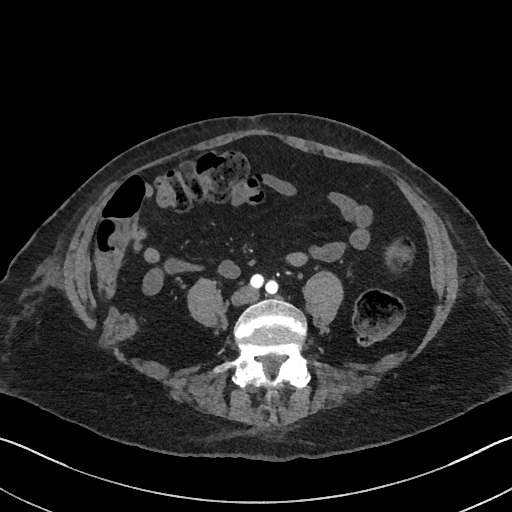
[im 139/222  soft-tissue]
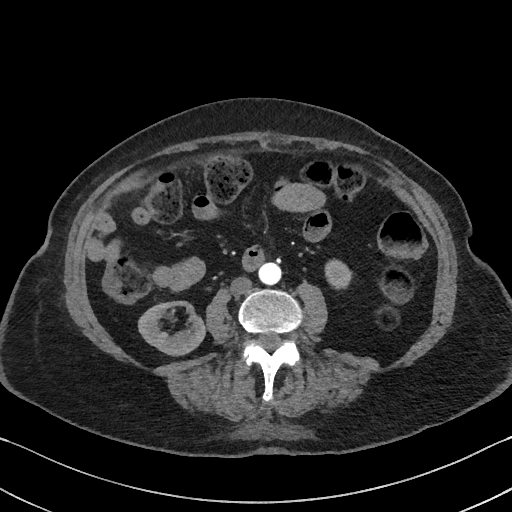
[im 157/222  soft-tissue]
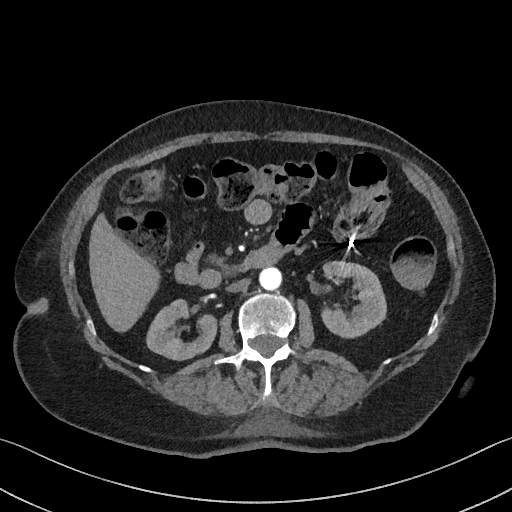
[im 157/222  bone]
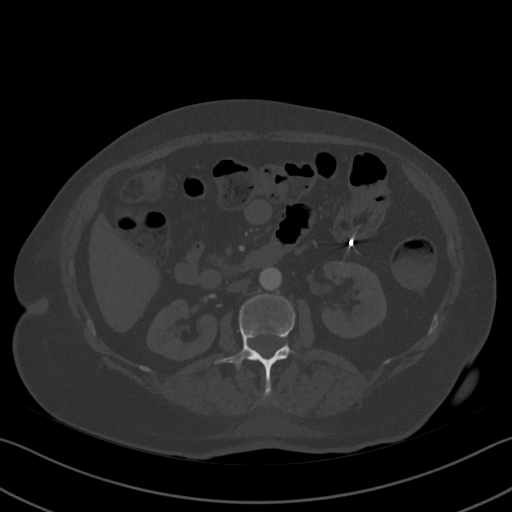
[im 175/222  soft-tissue]
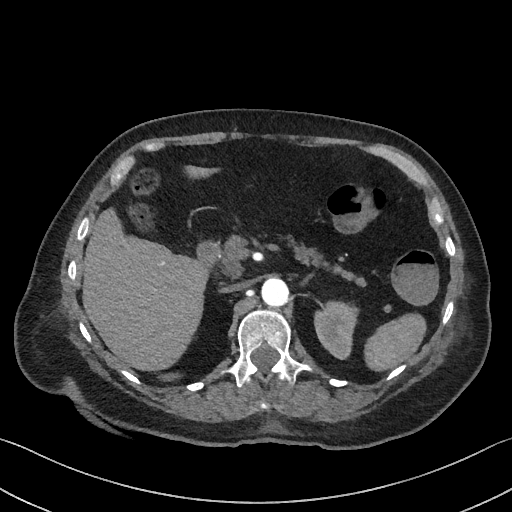
[im 194/222  soft-tissue]
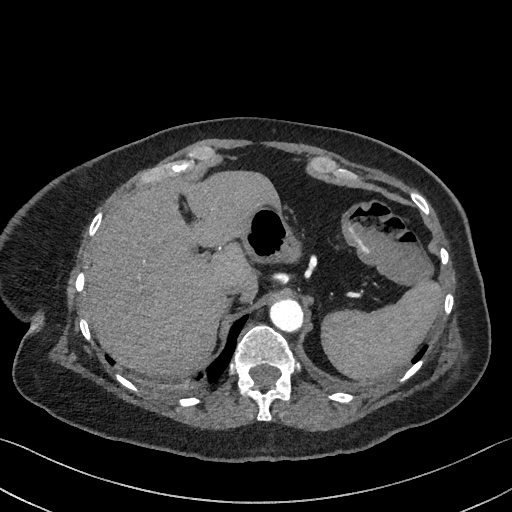
[im 212/222  soft-tissue]
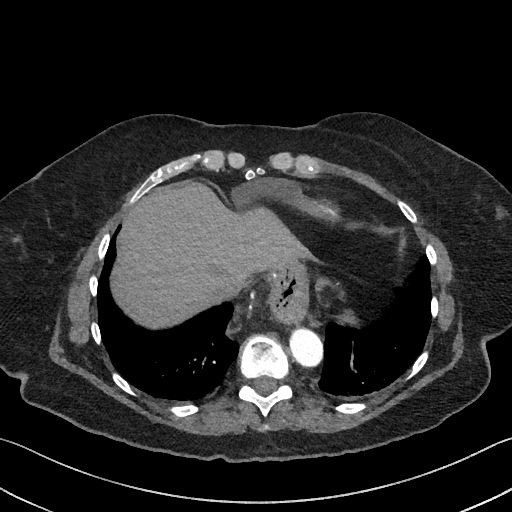

[Series 7: dissection 2mm cor · coronal · 0.85mm/px · 3 of 126 slices shown]
[im 32/126  soft-tissue]
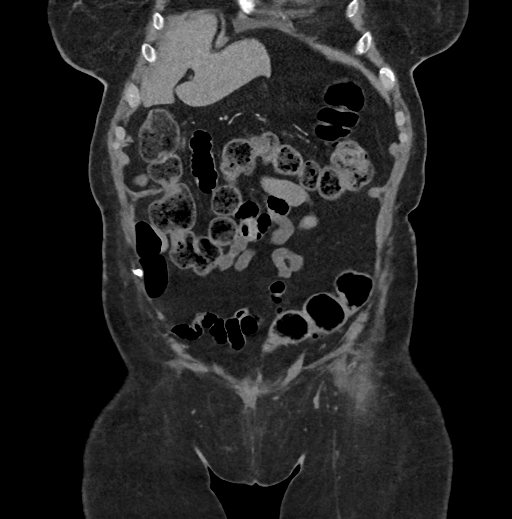
[im 63/126  soft-tissue]
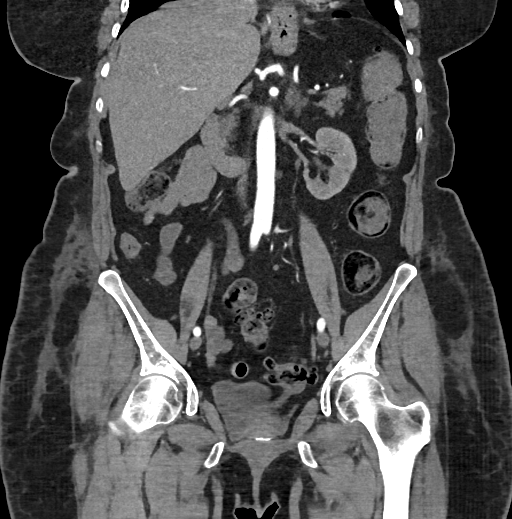
[im 94/126  soft-tissue]
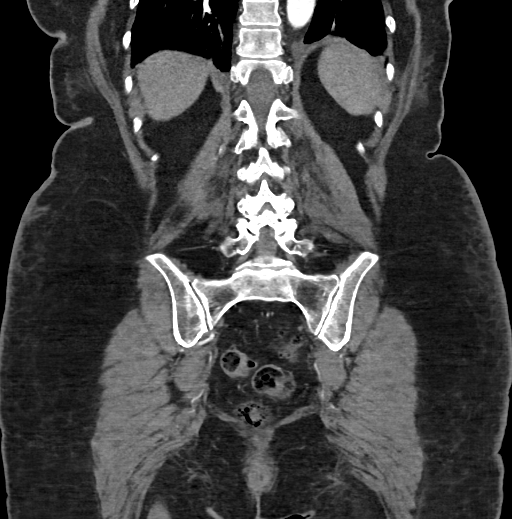

[15 of 46 positions shown; findings below may reference images not displayed]

FINDINGS: Mild bibasilar atelectasis is noted. A moderate hiatal hernia is
noted.

A small amount of contrast is noted within the splenic flexure of
the colon, compatible with acute hemorrhage into the splenic flexure
of the colon.

The abdominal aorta is grossly unremarkable in appearance, without
calcific atherosclerotic disease. The celiac trunk, superior
mesenteric artery, bilateral renal arteries and inferior mesenteric
artery remain fully patent. The inferior vena cava is unremarkable
in appearance.

The liver and spleen are unremarkable in appearance. The patient is
status post cholecystectomy, with clips noted at the gallbladder
fossa. The pancreas and adrenal glands are unremarkable.

A 3 mm stone is noted at the interpole region of the right kidney. A
2 mm stone is noted at the upper pole of the left kidney. The
kidneys are otherwise unremarkable. There is no evidence of
hydronephrosis. No obstructing ureteral stones are identified.

There is Aujla herniation of the ascending colon into a moderate
to large right posterior flank hernia, without evidence of
obstruction. Scattered diverticulosis is noted along the descending
and sigmoid colon, without evidence of diverticulitis.

No free fluid is identified. The small bowel is unremarkable in
appearance. The stomach is within normal limits. No acute vascular
abnormalities are seen.

The appendix is normal in caliber and contains air, without evidence
of appendicitis.

The bladder is mildly distended and grossly unremarkable.
Postoperative change is noted along the anterior pelvic wall. The
patient is status post hysterectomy. The ovaries are relatively
symmetric. No suspicious adnexal masses are seen. No inguinal
lymphadenopathy is seen.

No acute osseous abnormalities are identified. There is mild grade 1
anterolisthesis of L4 on L5, reflecting underlying facet disease.

Review of the MIP images confirms the above findings.
IMPRESSION: 1. Acute hemorrhage noted into the splenic flexure of the colon,
corresponding to the patient's GI bleeding.
2. Moderate hiatal hernia noted.
3. Scattered diverticulosis along the descending and sigmoid colon,
without evidence of diverticulitis.
4. Aujla herniation of the ascending colon into a moderate to
large right posterior flank hernia, without evidence of obstruction.
5. Small nonobstructing bilateral renal stones, measuring up to 3 mm
in size.
6. Mild bibasilar atelectasis noted.

These results were called by telephone at the time of interpretation
on 06/05/2015 at [DATE] to [place]m RN on [facility], who verbally
acknowledged these results.

## 2017-03-23 ENCOUNTER — Other Ambulatory Visit: Payer: Self-pay | Admitting: Internal Medicine

## 2017-06-25 ENCOUNTER — Other Ambulatory Visit: Payer: Self-pay | Admitting: Internal Medicine

## 2017-07-07 ENCOUNTER — Other Ambulatory Visit: Payer: Self-pay | Admitting: Internal Medicine

## 2017-10-19 ENCOUNTER — Ambulatory Visit: Payer: Medicare HMO | Admitting: Occupational Therapy

## 2018-02-07 ENCOUNTER — Other Ambulatory Visit: Payer: Self-pay | Admitting: Internal Medicine

## 2021-05-02 ENCOUNTER — Other Ambulatory Visit: Payer: Self-pay
